# Patient Record
Sex: Female | Born: 1940
Health system: Southern US, Community
[De-identification: ages and names within clinical notes are randomized; demographics above are authoritative.]

## PROBLEM LIST (undated history)

## (undated) DIAGNOSIS — D649 Anemia, unspecified: Secondary | ICD-10-CM

## (undated) DIAGNOSIS — I10 Essential (primary) hypertension: Secondary | ICD-10-CM

## (undated) DIAGNOSIS — M1990 Unspecified osteoarthritis, unspecified site: Secondary | ICD-10-CM

## (undated) DIAGNOSIS — F0390 Unspecified dementia without behavioral disturbance: Secondary | ICD-10-CM

## (undated) DIAGNOSIS — F32A Depression, unspecified: Secondary | ICD-10-CM

## (undated) DIAGNOSIS — E785 Hyperlipidemia, unspecified: Secondary | ICD-10-CM

## (undated) DIAGNOSIS — F329 Major depressive disorder, single episode, unspecified: Secondary | ICD-10-CM

## (undated) HISTORY — PX: ABDOMINAL HYSTERECTOMY: SHX81

## (undated) HISTORY — DX: Unspecified dementia, unspecified severity, without behavioral disturbance, psychotic disturbance, mood disturbance, and anxiety: F03.90

## (undated) HISTORY — PX: KNEE SURGERY: SHX244

## (undated) HISTORY — DX: Anemia, unspecified: D64.9

## (undated) HISTORY — DX: Hyperlipidemia, unspecified: E78.5

## (undated) HISTORY — DX: Essential (primary) hypertension: I10

## (undated) HISTORY — PX: OTHER SURGICAL HISTORY: SHX169

## (undated) HISTORY — PX: COLONOSCOPY: SHX174

---

## 2001-03-13 ENCOUNTER — Ambulatory Visit (HOSPITAL_COMMUNITY): Admission: RE | Admit: 2001-03-13 | Discharge: 2001-03-13 | Payer: Self-pay | Admitting: Pulmonary Disease

## 2002-03-18 ENCOUNTER — Ambulatory Visit (HOSPITAL_COMMUNITY): Admission: RE | Admit: 2002-03-18 | Discharge: 2002-03-18 | Payer: Self-pay | Admitting: Pulmonary Disease

## 2004-01-10 ENCOUNTER — Emergency Department (HOSPITAL_COMMUNITY): Admission: EM | Admit: 2004-01-10 | Discharge: 2004-01-10 | Payer: Self-pay | Admitting: Emergency Medicine

## 2004-01-11 ENCOUNTER — Ambulatory Visit (HOSPITAL_COMMUNITY): Admission: RE | Admit: 2004-01-11 | Discharge: 2004-01-11 | Payer: Self-pay | Admitting: Orthopaedic Surgery

## 2004-01-22 ENCOUNTER — Inpatient Hospital Stay (HOSPITAL_COMMUNITY): Admission: RE | Admit: 2004-01-22 | Discharge: 2004-01-25 | Payer: Self-pay | Admitting: Orthopedic Surgery

## 2004-04-18 ENCOUNTER — Encounter (HOSPITAL_COMMUNITY): Admission: RE | Admit: 2004-04-18 | Discharge: 2004-05-18 | Payer: Self-pay | Admitting: Orthopedic Surgery

## 2004-05-12 ENCOUNTER — Ambulatory Visit (HOSPITAL_COMMUNITY): Admission: RE | Admit: 2004-05-12 | Discharge: 2004-05-12 | Payer: Self-pay | Admitting: Pulmonary Disease

## 2004-05-18 ENCOUNTER — Encounter (HOSPITAL_COMMUNITY): Admission: RE | Admit: 2004-05-18 | Discharge: 2004-06-17 | Payer: Self-pay | Admitting: Orthopedic Surgery

## 2004-10-03 ENCOUNTER — Ambulatory Visit: Payer: Self-pay | Admitting: Orthopedic Surgery

## 2005-08-22 ENCOUNTER — Ambulatory Visit (HOSPITAL_COMMUNITY): Admission: RE | Admit: 2005-08-22 | Discharge: 2005-08-22 | Payer: Self-pay | Admitting: Pulmonary Disease

## 2006-08-30 ENCOUNTER — Ambulatory Visit (HOSPITAL_COMMUNITY): Admission: RE | Admit: 2006-08-30 | Discharge: 2006-08-30 | Payer: Self-pay | Admitting: Pulmonary Disease

## 2007-01-31 ENCOUNTER — Ambulatory Visit (HOSPITAL_COMMUNITY): Admission: RE | Admit: 2007-01-31 | Discharge: 2007-01-31 | Payer: Self-pay | Admitting: Pulmonary Disease

## 2008-02-28 ENCOUNTER — Ambulatory Visit (HOSPITAL_COMMUNITY): Admission: RE | Admit: 2008-02-28 | Discharge: 2008-02-28 | Payer: Self-pay | Admitting: Pulmonary Disease

## 2009-03-02 ENCOUNTER — Ambulatory Visit (HOSPITAL_COMMUNITY): Admission: RE | Admit: 2009-03-02 | Discharge: 2009-03-02 | Payer: Self-pay | Admitting: Pulmonary Disease

## 2010-03-15 ENCOUNTER — Ambulatory Visit (HOSPITAL_COMMUNITY): Admission: RE | Admit: 2010-03-15 | Discharge: 2010-03-15 | Payer: Self-pay | Admitting: Pulmonary Disease

## 2010-11-13 ENCOUNTER — Encounter: Payer: Self-pay | Admitting: Pulmonary Disease

## 2011-03-10 NOTE — Discharge Summary (Signed)
Doris Riley, Doris Riley                         ACCOUNT NO.:  [id number]   MEDICAL RECORD NO.:  [id number]                   PATIENT TYPE:  INP   LOCATION:  A330                                 FACILITY:  [facility]   PHYSICIAN:  Vickki Hearing, M.D.           DATE OF BIRTH:  [birth year]   DATE OF ADMISSION:  01/22/2004  DATE OF DISCHARGE:  01/25/2004                                 DISCHARGE SUMMARY   ADMISSION DIAGNOSIS:  Tibial plateau fracture, left knee.   DISCHARGE DIAGNOSIS:  Tibial plateau fracture, left knee.   PROCEDURE:  Open treatment and internal fixation with artificial bone graft,  left knee.   IMPLANTS USED:  DBX putty, 1 cc.  Milled cancellous bone chips, 15 cc.  We  used a periarticular plate with five screws.   HOSPITAL COURSE:  The patient had a split left tibial plateau fracture and  underwent open treatment and internal fixation with bone graft, as stated.  She tolerated that well.  She was placed in a CPM postoperatively and  allowed to be nonweightbearing with crutches and physical therapy.  She  tolerated that well and was discharged home with Vicodin, nonweightbearing  status, physical therapy at home, and was scheduled for follow up in one  week.   DISPOSITION:  Home.   CONDITION ON DISCHARGE:  Improved.   STATUS:  Nonweightbearing with a walker.     ___________________________________________                                         Vickki Hearing, M.D.   SEH/MEDQ  D:  02/07/2004  T:  02/08/2004  Job:  [id number]
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
## 2011-03-13 ENCOUNTER — Other Ambulatory Visit (HOSPITAL_COMMUNITY): Payer: Self-pay | Admitting: Pulmonary Disease

## 2011-03-13 DIAGNOSIS — Z139 Encounter for screening, unspecified: Secondary | ICD-10-CM

## 2011-03-23 ENCOUNTER — Ambulatory Visit (HOSPITAL_COMMUNITY)
Admission: RE | Admit: 2011-03-23 | Discharge: 2011-03-23 | Disposition: A | Discharge status: Home / Self Care | Payer: Medicare Other | Source: Ambulatory Visit | Attending: Pulmonary Disease | Admitting: Pulmonary Disease

## 2011-03-23 DIAGNOSIS — Z1231 Encounter for screening mammogram for malignant neoplasm of breast: Secondary | ICD-10-CM | POA: Insufficient documentation

## 2011-03-23 DIAGNOSIS — Z139 Encounter for screening, unspecified: Secondary | ICD-10-CM

## 2011-04-22 ENCOUNTER — Emergency Department (HOSPITAL_COMMUNITY): Payer: Medicare Other

## 2011-04-22 ENCOUNTER — Inpatient Hospital Stay (HOSPITAL_COMMUNITY)
Admission: EM | Admit: 2011-04-22 | Discharge: 2011-04-24 | DRG: 683 | Disposition: A | Discharge status: Home / Self Care | Payer: Medicare Other | Attending: Pulmonary Disease | Admitting: Pulmonary Disease

## 2011-04-22 DIAGNOSIS — E860 Dehydration: Secondary | ICD-10-CM | POA: Diagnosis present

## 2011-04-22 DIAGNOSIS — N179 Acute kidney failure, unspecified: Principal | ICD-10-CM | POA: Diagnosis present

## 2011-04-22 DIAGNOSIS — N390 Urinary tract infection, site not specified: Secondary | ICD-10-CM | POA: Diagnosis present

## 2011-04-22 DIAGNOSIS — D649 Anemia, unspecified: Secondary | ICD-10-CM | POA: Diagnosis present

## 2011-04-22 DIAGNOSIS — I10 Essential (primary) hypertension: Secondary | ICD-10-CM | POA: Diagnosis present

## 2011-04-22 DIAGNOSIS — E785 Hyperlipidemia, unspecified: Secondary | ICD-10-CM | POA: Diagnosis present

## 2011-04-22 DIAGNOSIS — A498 Other bacterial infections of unspecified site: Secondary | ICD-10-CM | POA: Diagnosis present

## 2011-04-22 DIAGNOSIS — E876 Hypokalemia: Secondary | ICD-10-CM | POA: Diagnosis present

## 2011-04-22 DIAGNOSIS — Z791 Long term (current) use of non-steroidal anti-inflammatories (NSAID): Secondary | ICD-10-CM

## 2011-04-22 LAB — URINE MICROSCOPIC-ADD ON

## 2011-04-22 LAB — BASIC METABOLIC PANEL
CO2: 27 mEq/L (ref 19–32)
Calcium: 11.1 mg/dL — ABNORMAL HIGH (ref 8.4–10.5)
GFR calc Af Amer: 19 mL/min — ABNORMAL LOW (ref >60)
Glucose, Bld: 111 mg/dL — ABNORMAL HIGH (ref 70–99)
Sodium: 132 mEq/L — ABNORMAL LOW (ref 135–145)

## 2011-04-22 LAB — TROPONIN I: Troponin I: <0.3 ng/mL (ref <0.30)

## 2011-04-22 LAB — DIFFERENTIAL
Basophils Relative: 0 % (ref 0–1)
WBC Morphology: INCREASED

## 2011-04-22 LAB — URINALYSIS, ROUTINE W REFLEX MICROSCOPIC
Nitrite: NEGATIVE
Protein, ur: 100 mg/dL — AB
Specific Gravity, Urine: >1.03 — ABNORMAL HIGH (ref 1.005–1.030)
Urobilinogen, UA: 1 mg/dL (ref 0.0–1.0)
pH: 5 (ref 5.0–8.0)

## 2011-04-22 LAB — CBC
MCHC: 33.8 g/dL (ref 30.0–36.0)
MCV: 84.2 fL (ref 78.0–100.0)
RBC: 3.66 MIL/uL — ABNORMAL LOW (ref 3.87–5.11)

## 2011-04-22 LAB — D-DIMER, QUANTITATIVE: D-Dimer, Quant: 1.93 ug/mL-FEU — ABNORMAL HIGH (ref 0.00–0.48)

## 2011-04-22 LAB — MAGNESIUM: Magnesium: 2.8 mg/dL — ABNORMAL HIGH (ref 1.5–2.5)

## 2011-04-23 ENCOUNTER — Inpatient Hospital Stay (HOSPITAL_COMMUNITY): Payer: Medicare Other

## 2011-04-23 LAB — BASIC METABOLIC PANEL
BUN: 21 mg/dL (ref 6–23)
CO2: 26 mEq/L (ref 19–32)
Chloride: 98 mEq/L (ref 96–112)
GFR calc non Af Amer: 30 mL/min — ABNORMAL LOW (ref >60)

## 2011-04-23 LAB — DIFFERENTIAL
Lymphocytes Relative: 6 % — ABNORMAL LOW (ref 12–46)
Myelocytes: 0 %
Promyelocytes Absolute: 0 %
nRBC: 0 /100 WBC

## 2011-04-23 LAB — CBC
HCT: 31.2 % — ABNORMAL LOW (ref 36.0–46.0)
Hemoglobin: 10.2 g/dL — ABNORMAL LOW (ref 12.0–15.0)
MCH: 28.3 pg (ref 26.0–34.0)
MCV: 86.7 fL (ref 78.0–100.0)
RBC: 3.6 MIL/uL — ABNORMAL LOW (ref 3.87–5.11)
RDW: 13.7 % (ref 11.5–15.5)
WBC: 20.4 10*3/uL — ABNORMAL HIGH (ref 4.0–10.5)

## 2011-04-23 NOTE — H&P (Signed)
  Doris Riley, DESA NO.:  [id number]  MEDICAL RECORD NO.:  [id number]  LOCATION:  A312                          FACILITY:  [facility]  PHYSICIAN:  Cloa Bushong D. Felecia Shelling, MD   DATE OF BIRTH:  [birth year]  DATE OF ADMISSION:  04/22/2011 DATE OF DISCHARGE:  LH                             HISTORY & PHYSICAL   CHIEF COMPLAINT:  Generalized weakness and shortness of breath.  HISTORY OF PRESENT ILLNESS:  This is a 70 year old female patient with history of hypertension and hyperlipidemia who is a patient of Dr. Kari Baars came to emergency room with above complaints.  The patient recently had these symptoms especially during the daytime.  She continued to monitor her symptoms at home.  However, today she tried to wash her car and she became severely short of breath.  The patient was brought to emergency room and she was evaluated and was found to have second significant renal failure with hypokalemia.  The patient was then started on IV fluids and potassium supplements.  She was admitted for further evaluation.  REVIEW OF SYSTEMS:  The patient feels generally hot and sweaty.  She has no fever, chills, chest pain, nausea, vomiting, abdominal pain, dysuria, urgency, or frequency of urination.  PAST MEDICAL HISTORY: 1. Hypertension. 2. Hyperlipidemia. 3. History of tibial fracture.  CURRENT MEDICATIONS: 1. Benicar/HCTZ 20/12.5, 1 tablet daily. 2. Aleve 1 p.r.n. 3. Multivitamin 1 tablet daily.  SOCIAL HISTORY:  The patient lives alone at home.  She has no history of alcohol, tobacco, or substance abuse.  FAMILY HISTORY:  Her mother died of CA of the colon as well as her brother.  Her father died of CA of the prostate.  PHYSICAL EXAMINATION:  GENERAL:  The patient is alert, awake, and sick looking. VITAL SIGNS:  Blood pressure 152/70, pulse 80, respiratory rate 18, temperature 98.4 degrees Fahrenheit. CHEST:  Decreased air entry, few rhonchi. CARDIOVASCULAR  SYSTEM:  First and second heart sounds heard.  No murmur, no gallop. ABDOMEN:  Soft and lax.  Bowel sounds positive.  No mass or organomegaly. EXTREMITIES:  No leg edema.  LABS ON ADMISSION:  BMP:  Sodium 132, potassium 2.8, chloride 91, carbon dioxide 27, glucose 111, BUN 28, creatinine 3.1, calcium 11, troponin is less than 0.3.  CBC:  WBC 24.6, hemoglobin 10.4, hematocrit 30.8, and platelets 348,000, magnesium 2.8.  Urinalysis showed specific gravity greater than 1.030, pH 5.0, leukocytes trace and WBCs 21-50.  ASSESSMENT: 1. Renal failure probably acute, etiology not clear.  However, the     fact the patient has been taking NSAID could be the causing factor. 2. Severe hypokalemia. 3. Urinary tract infection. 4. History of hypertension. 5. History of hyperlipidemia.  PLAN:  We will continue the patient on IV fluids and potassium supplement.  We will start the patient on treatment for urinary tract infection.  We will do nephrology consult and we will continue the patient on her regular medications.     Khalin Royce D. Felecia Shelling, MD  TDF/MEDQ  D:  04/22/2011  T:  04/22/2011  Job:  [id number]  Electronically Signed by Avon Gully MD on 04/23/2011 09:03:41 AM

## 2011-04-24 LAB — DIFFERENTIAL
Basophils Absolute: 0.1 10*3/uL (ref 0.0–0.1)
Basophils Relative: 1 % (ref 0–1)
Eosinophils Absolute: 0.3 10*3/uL (ref 0.0–0.7)
Neutro Abs: 12.3 10*3/uL — ABNORMAL HIGH (ref 1.7–7.7)
Neutrophils Relative %: 74 % (ref 43–77)

## 2011-04-24 LAB — PHOSPHORUS: Phosphorus: 1.9 mg/dL — ABNORMAL LOW (ref 2.3–4.6)

## 2011-04-24 LAB — CBC
Hemoglobin: 9.3 g/dL — ABNORMAL LOW (ref 12.0–15.0)
MCH: 28.5 pg (ref 26.0–34.0)
Platelets: 413 10*3/uL — ABNORMAL HIGH (ref 150–400)
RBC: 3.26 MIL/uL — ABNORMAL LOW (ref 3.87–5.11)
WBC: 16.6 10*3/uL — ABNORMAL HIGH (ref 4.0–10.5)

## 2011-04-24 LAB — URINE CULTURE: Culture  Setup Time: 201206302009

## 2011-04-24 LAB — BASIC METABOLIC PANEL
Calcium: 10.2 mg/dL (ref 8.4–10.5)
GFR calc Af Amer: 46 mL/min — ABNORMAL LOW (ref >60)
GFR calc non Af Amer: 38 mL/min — ABNORMAL LOW (ref >60)
Glucose, Bld: 113 mg/dL — ABNORMAL HIGH (ref 70–99)
Potassium: 3.5 mEq/L (ref 3.5–5.1)
Sodium: 135 mEq/L (ref 135–145)

## 2011-04-24 LAB — IRON AND TIBC
Iron: 16 ug/dL — ABNORMAL LOW (ref 42–135)
Iron: 18 ug/dL — ABNORMAL LOW (ref 42–135)
TIBC: 174 ug/dL — ABNORMAL LOW (ref 250–470)
UIBC: 190 ug/dL

## 2011-04-24 LAB — VITAMIN B12: Vitamin B-12: >2000 pg/mL — ABNORMAL HIGH (ref 211–911)

## 2011-04-24 LAB — FERRITIN: Ferritin: 1087 ng/mL — ABNORMAL HIGH (ref 10–291)

## 2011-04-25 LAB — PTH, INTACT AND CALCIUM
Calcium, Total (PTH): 9.1 mg/dL (ref 8.4–10.5)
PTH: 17.3 pg/mL (ref 14.0–72.0)

## 2011-04-26 NOTE — Group Therapy Note (Signed)
  NAMEKENORA, SPAYD NO.:  [id number]  MEDICAL RECORD NO.:  [id number]  LOCATION:  A312                          FACILITY:  [facility]  PHYSICIAN:  Edward L. Juanetta Gosling, M.D.DATE OF BIRTH:  1941-01-28  DATE OF PROCEDURE: DATE OF DISCHARGE:                                PROGRESS NOTE   Ms. Vecchiarelli is better.  She has no new complaints.  Her renal ultrasound was essentially normal.  She had a temperature of 100.3, but I think she has got a urinary tract infection with E-coli.  Her temperature is 98.9 now, pulse 77, respirations 19, blood pressure 146/69 and O2 sats 98%. Her renal function has markedly improved and her creatinine is now down to 1.39.  Her CBC shows her hemoglobin is down to 9.3.  I think most of that is delusional.  Assessment then is that she has had an episode of acute renal failure probably multifactorial.  She is anemic.  She has been taking a large amount of nonsteroidal anti-inflammatory drugs, so I think her anemia maybe related to that, but she is so much better.  I am going to discharge her home and get the anemia workup as an outpatient.  I will have her get an anemia profile here today.     Edward L. Juanetta Gosling, M.D.     ELH/MEDQ  D:  04/24/2011  T:  04/24/2011  Job:  [id number]  Electronically Signed by Kari Baars M.D. on 04/26/2011 08:59:23 AM

## 2011-04-26 NOTE — Discharge Summary (Signed)
  NAMEJIA, Doris NO.:  [id number]  MEDICAL RECORD NO.:  [id number]  LOCATION:  A312                          FACILITY:  [facility]  PHYSICIAN:  Hrishikesh Hoeg L. Juanetta Gosling, M.D.DATE OF BIRTH:  1941-07-23  DATE OF ADMISSION:  04/22/2011 DATE OF DISCHARGE:  07/02/2012LH                              DISCHARGE SUMMARY   FINAL DISCHARGE DIAGNOSES: 1. Acute renal failure, multifactorial. 2. Dehydration. 3. Hypokalemia. 4. Hypertension. 5. Hyperlipidemia. 6. History of tibial fracture. 7. Anemia.  [name] is a 70 year old who has a history of hypertension and hyperlipidemia who came to the emergency room because of weakness and shortness of breath.  She had been having symptoms for the last several days.  She tried to wash her car on the day of admission, became acutely short of breath, was found to have significant renal failure and hypokalemia.  Exam showed that she appeared to be acutely sick.  Blood pressure 152/70, pulse 80 and respirations 18.  The rest of her examination was pretty much unremarkable.  Her laboratory work showed sodium was 132, potassium was 2.8, BUN was 28, creatinine 3.1, white count was 24,600, hemoglobin of 10.4, platelets 348.  Urine showed white cells and grew E-coli.  HOSPITAL COURSE:  She was treated with IV fluids and showed marked improvement in her renal function.  Her potassium came back to normal. Her hemoglobin dropped from the initial hemoglobin of 10.4-9.3.  Most of this was felt to be delusional.  Her white count was down to 16,600 by the time of discharge and her creatinine was 1.39.  She was noted to have been taking a great deal of Aleve at home.  She was also on Benicar HCT and was felt that she had multifactorial renal failure.  Her renal ultrasound did not show medical kidney disease and did not show obstruction.  She is going to be discharged home in markedly improved condition.  She has pending a anemia profile and she  will return to my office on Thursday, the 5th for repeat CBC and repeat BMET.  She will stop her current Benicar HCT and start on Benicar 40 mg daily.  She can take Tylenol 500 mg q.6 h. p.r.n. pain.  She will stop Aleve that she has been taking at home.  She will take fish oil 1000 mg daily, multiple vitamins daily, Cipro 500 mg b.i.d. x5 more days.  Pending results of her urine culture.  She will be on Zocor 40 mg daily and Zetia 10 mg daily and she will be on iron 325 mg daily also.  As mentioned, she will follow up in my office in about 4 days for lab work and been seen in about 2 weeks.     Sneijder Bernards L. Juanetta Gosling, M.D.     ELH/MEDQ  D:  04/24/2011  T:  04/24/2011  Job:  [id number]  Electronically Signed by Kari Baars M.D. on 04/26/2011 08:59:18 AM

## 2011-04-26 NOTE — Group Therapy Note (Signed)
  NAMEROSALEAH, PERSON NO.:  [id number]  MEDICAL RECORD NO.:  [id number]  LOCATION:                                 FACILITY:  PHYSICIAN:  Charnese Federici L. Juanetta Gosling, M.D.DATE OF BIRTH:  03/13/1941  DATE OF PROCEDURE: DATE OF DISCHARGE:                                PROGRESS NOTE   Ms. Baum was admitted yesterday with hypokalemia, elevated creatinine, and shortness of breath.  She says she is doing okay now, has no new complaints.  PHYSICAL EXAMINATION:  GENERAL:  Her exam shows she is awake and alert. VITAL SIGNS:  Temperature is 98.5, pulse 77, respirations 19, blood pressure 114/66, O2 sats 95% on room air.  Weight is 90.4 kg.  She had a BNP today that was 512, but did not have a BMET ordered yet, so we are going to order that.  Her potassium was 2.8, BUN was 28, creatinine of 3.04.  Assessment then is that she has acute renal failure.  She is hypokalemic.  She was on a ARB with a diuretic, so that may be part of the problem, but she was also taking Aleve on a daily basis.  I think she was probably also somewhat dehydrated.  Plan then is for fluid replenishment.  She is going to have a renal ultrasound, Dr. Kristian Covey, has seen her, and I appreciate his help. Repeat her labs today and see how much if any she is improved.  I discussed this at length with the patient and her family this morning.     Myrian Botello L. Juanetta Gosling, M.D.     ELH/MEDQ  D:  04/23/2011  T:  04/23/2011  Job:  [id number]  Electronically Signed by Kari Baars M.D. on 04/26/2011 08:59:27 AM

## 2011-04-28 LAB — CULTURE, BLOOD (SINGLE): Culture: NO GROWTH

## 2011-04-30 LAB — ALDOSTERONE + RENIN ACTIVITY W/ RATIO
ALDO / PRA Ratio: 0.4 Ratio — ABNORMAL LOW (ref 0.9–28.9)
Aldosterone: <1 ng/dL

## 2011-05-12 NOTE — Consult Note (Addendum)
Doris Riley, Doris Riley               ACCOUNT NO.:  [id number]  MEDICAL RECORD NO.:  [id number]  LOCATION:                                 FACILITY:  PHYSICIAN:  Tesfaye D. Felecia Shelling, MD   DATE OF BIRTH:  [birth year]  DATE OF CONSULTATION: DATE OF DISCHARGE:                                CONSULTATION   REQUESTING PHYSICIAN:  Tesfaye D. Felecia Shelling, MD  REASON FOR CONSULT:  Elevated BUN and creatinine.  [name] is a 70 year old female with past medical history of hypertension and also hyperlipidemia.  Presently came to the emergency room because of significant weakness.  According to the patient, for the last couple of days, she was not able to eat.  Also, she says that she was feeling weak to the point where she could not stand.  When she was brought to the emergency room, she was also found to have hypokalemia. Ms. Neu denies taking any diuretics ever.  The patient has been taking Aleve 1-3 tablets a day for arthritis for the last 2-3 years. Presently, she is feeling better.  She does not have any nausea, no vomiting.  She denies also any fevers, chills, or sweating.  PAST MEDICAL HISTORY:  She has a history of hypertension.  She has history of hyperlipidemia and history of degenerative joint disease.  SURGICAL HISTORY:  She has a history of tibial fracture status post surgery.  Her medications as an outpatient include: 1. Benicar/hydrochlorothiazide 20/12.5 mg p.o. daily. 2. Aleve 1 tablet p.r.n., but according to her she has been taking up     to 3 times a day depending on her joint on a daily basis. 3. Multivitamin 1 tablet p.r.n.  SOCIAL HISTORY:  No history of smoking.  No history of alcohol abuse and no history of illicit drug use.  FAMILY HISTORY:  No history of renal failure.  REVIEW OF SYSTEMS:  Presently, she is feeling better.  Her main complaint seems to be weakness and this is also associated with poor appetite.  She denies any nausea or vomiting.  She does not  have any diarrhea.  She has occasional urgency, but no frequency.  She denies also any swelling of the legs.  PHYSICAL EXAMINATION:  GENERAL:  The patient is alert, in no apparent distress. VITAL SIGNS:  Her temperature is 98.5, pulse of 77, blood pressure 114/66, and O2 saturation 95.  Her white blood cell count is 24.6, hemoglobin 10.4, and hematocrit 30.8.  Her sodium is 132, potassium 2.8, chloride 91, CO2 of 27, BUN is 28, and creatinine is 3.0.  She has a calcium of 11.1, magnesium of 2.8.  ASSESSMENT: 1. Acute kidney injury.  At this moment, it seems to be multifactorial     including nonsteroidal-induced renal failure plus dehydration and     possibly plus also prerenal syndrome as the patient has urine     specific gravity of 1.03. 2. History of hypokalemia.  At this moment since she is on     Benicar/hydrochlorothiazide, probably that may have caused her     hypokalemia.  At this moment with history of hypertension,     hypokalemia, other etiologies such as hyperaldosteronism  and also     ill syndrome, could be put into the differential diagnosis. 3. History of urinary tract infection. 4. History of anemia.  At this moment, it seems to be mild. 5. History of hypertension.  She is as an outpatient on Benicar, but     presently the hydrochlorothiazide is discontinued.  Blood pressure     seems to be controlled very well. 6. History of hypercalcemia, possibly related to her dehydration. 7. History of degenerative joint disease. 8. History of hyperlipidemia.  Her medications consist of Cipro now.     She is also on Zetia, Benicar 20 mg p.o. daily, and simvastatin 40     mg p.o. daily.  She is also on IV fluids.  RECOMMENDATIONS:  We will check ultrasound of the kidneys.  We will increase her IV fluids to 175 mL per hour.  We will check also her intact PTH.  We will check renin and aldosterone level.  We will do ultrasound of the kidneys.     [name],  M.D.   ______________________________ [name]. Felecia Shelling, MD    BB/MEDQ  D:  04/23/2011  T:  04/24/2011  Job:  [id number]  Electronically Signed by Avon Gully MD on 05/12/2011 07:08:56 AM Electronically Signed by [name] M.D. on 05/17/2011 04:34:37 PM

## 2011-07-27 ENCOUNTER — Telehealth: Payer: Self-pay

## 2011-07-27 NOTE — Telephone Encounter (Signed)
LMOM to call. Pt will need OV prior to scheduling colonoscopy.

## 2011-07-28 NOTE — Telephone Encounter (Signed)
Pt left VM to call her and leave appt time on machine. I called back and left vm her appt is 08/02/2011 @ 9:00 AM with [name], NP., for OV and she will get set up for colonoscopy at that time if needed.  Told her to please arrive 15 min early and bring ins cards and list of medications. Also, gave the address info.

## 2011-07-31 ENCOUNTER — Telehealth: Payer: Self-pay

## 2011-07-31 NOTE — Telephone Encounter (Signed)
Called and confirmed pt got the Vm with Appt date/time  08/02/2011 @ 9:00 AM with [name], NP.

## 2011-08-02 ENCOUNTER — Ambulatory Visit: Payer: Medicare Other | Admitting: Urgent Care

## 2011-08-03 ENCOUNTER — Ambulatory Visit: Payer: Medicare Other | Admitting: Urgent Care

## 2011-08-07 ENCOUNTER — Ambulatory Visit (INDEPENDENT_AMBULATORY_CARE_PROVIDER_SITE_OTHER): Payer: Medicare Other | Admitting: Gastroenterology

## 2011-08-07 ENCOUNTER — Encounter: Payer: Self-pay | Admitting: Gastroenterology

## 2011-08-07 VITALS — BP 140/80 | HR 63 | Temp 97.9°F | Ht 66.0 in | Wt 196.4 lb

## 2011-08-07 DIAGNOSIS — K5900 Constipation, unspecified: Secondary | ICD-10-CM

## 2011-08-07 DIAGNOSIS — Z800 Family history of malignant neoplasm of digestive organs: Secondary | ICD-10-CM

## 2011-08-07 DIAGNOSIS — D649 Anemia, unspecified: Secondary | ICD-10-CM | POA: Insufficient documentation

## 2011-08-07 DIAGNOSIS — R748 Abnormal levels of other serum enzymes: Secondary | ICD-10-CM

## 2011-08-07 MED ORDER — POLYETHYLENE GLYCOL 3350 GRAN: 17.0000 g | GRANULES | Freq: Every day | Status: DC

## 2011-08-07 NOTE — Assessment & Plan Note (Signed)
Add miralax 17g daily prn.

## 2011-08-07 NOTE — Progress Notes (Signed)
Primary Care Physician:  Fredirick Maudlin, MD  Primary Gastroenterologist:  Jonette Eva, MD   Chief Complaint  Patient presents with  . Colonoscopy    HPI:  Doris Riley is a 70 y.o. female here for consideration of colonoscopy. Earlier this year was in the hospital for acute renal failure, UTI. She was taking a lot of Aleve. She also had some anemia with Hgb in 9 range. Her ferritin was over 1000. B12/folate normal. Anemia panel  most c/w  with anemia of chronic disease +/- mixed anemia.  No rectal bleeding. No melena. Constipation, take green tea which works. No abdominal pain, heartburn, n/v, dysphagia. No weight loss.   Mother had colon cancer in her 65s. Brother had colostomy but patient is not sure why.   Current Outpatient Prescriptions  Medication Sig Dispense Refill  . acetaminophen (TYLENOL) 500 MG tablet Take 500 mg by mouth every 6 (six) hours as needed.        Marland Kitchen amLODipine-olmesartan (AZOR) 5-40 MG per tablet Take 1 tablet by mouth daily.        Marland Kitchen ezetimibe (ZETIA) 10 MG tablet Take 10 mg by mouth daily.        . pravastatin (PRAVACHOL) 40 MG tablet Take 40 mg by mouth daily.       . Polyethylene Glycol 3350 GRAN Take 17 g by mouth daily.  527 g  5    Allergies as of 08/07/2011  . (No Known Allergies)    Past Medical History  Diagnosis Date  . Hyperlipidemia   . Hypertension   . Anemia     mild    Past Surgical History  Procedure Date  . S/p hysterectomy     partial  . Knee surgery     left, fracture  . Colonoscopy     >10 years ago    Family History  Problem Relation Age of Onset  . Colon cancer Mother     41s  . Prostate cancer Father   . GI problems Brother     colostomy, patient not sure why    History   Social History  . Marital Status: Divorced    Spouse Name: N/A    Number of Children: 1  . Years of Education: N/A   Occupational History  . retired    Social History Main Topics  . Smoking status: Never Smoker   . Smokeless  tobacco: Not on file  . Alcohol Use: Yes     half a glass a week  . Drug Use: No  . Sexually Active: Not on file   Other Topics Concern  . Not on file   Social History Narrative  . No narrative on file      ROS:  General: Negative for anorexia, weight loss, fever, chills, fatigue, weakness. Eyes: Negative for vision changes.  ENT: Negative for hoarseness, difficulty swallowing , nasal congestion. CV: Negative for chest pain, angina, palpitations, dyspnea on exertion, peripheral edema.  Respiratory: Negative for dyspnea at rest, dyspnea on exertion, cough, sputum, wheezing.  GI: See history of present illness. GU:  Negative for dysuria, hematuria, urinary incontinence, urinary frequency, nocturnal urination.  MS: Negative for joint pain, low back pain.  Derm: Negative for rash or itching.  Neuro: Negative for weakness, abnormal sensation, seizure, frequent headaches, memory loss, confusion.  Psych: Negative for anxiety, depression, suicidal ideation, hallucinations.  Endo: Negative for unusual weight change.  Heme: Negative for bruising or bleeding. Allergy: Negative for rash or hives.  Physical Examination:  BP 140/80  Pulse 63  Temp(Src) 97.9 F (36.6 C) (Temporal)  Ht 5\' 6"  (1.676 m)  Wt 196 lb 6.4 oz (89.086 kg)  BMI 31.70 kg/m2   General: Well-nourished, well-developed in no acute distress.  Head: Normocephalic, atraumatic.   Eyes: Conjunctiva pink, no icterus. Mouth: Oropharyngeal mucosa moist and pink , no lesions erythema or exudate. Neck: Supple without thyromegaly, masses, or lymphadenopathy.  Lungs: Clear to auscultation bilaterally.  Heart: Regular rate and rhythm, no murmurs rubs or gallops.  Abdomen: Bowel sounds are normal, nontender, nondistended, no hepatosplenomegaly or masses, no abdominal bruits or    hernia , no rebound or guarding.   Rectal: deferef to time for colonoscopy.. Extremities: No lower extremity edema. No clubbing or deformities.    Neuro: Alert and oriented x 4 , grossly normal neurologically.  Skin: Warm and dry, no rash or jaundice.   Psych: Alert and cooperative, normal mood and affect.  Labs: 05/26/2011: WBC 6300, H/H 11.5/37.2, Plt 356,000, bun 10, cre 0.81, tbili 0.4, ap 124H, ast 22, alt 13, alb 4.3  Lab Results  Component Value Date   IRON 18* 04/24/2011   TIBC 208* 04/24/2011   FERRITIN 1087* 04/24/2011   Lab Results  Component Value Date   VITAMINB12 >2000* 04/24/2011   Lab Results  Component Value Date   FOLATE >20.0 04/24/2011      Imaging Studies: No results found.

## 2011-08-07 NOTE — Assessment & Plan Note (Signed)
Normoctyic anemia much improved since hospitalization. Anemia panel most c/w anemia chronic disease/mixed anemia. Heme status unknown. FH CRC. Constipation well-controlled with dietary measures. Recommend colonoscopy.  I have discussed the risks, alternatives, benefits with regards to but not limited to the risk of reaction to medication, bleeding, infection, perforation and the patient is agreeable to proceed. Written consent to be obtained.

## 2011-08-07 NOTE — Patient Instructions (Signed)
We have scheduled you for a colonoscopy. Please see separate instructions. We will review your most recent labs and let you know if you need any further labs done. Begin Miralax 17g daily for constipation.

## 2011-08-07 NOTE — Assessment & Plan Note (Signed)
Mildly elevated alk phos. Obtain most recent labs done from PCP. Further recommendations to follow.

## 2011-08-09 NOTE — Progress Notes (Signed)
Cc to PCP 

## 2011-08-28 ENCOUNTER — Encounter (HOSPITAL_COMMUNITY): Payer: Self-pay | Admitting: Pharmacy Technician

## 2011-08-31 MED ORDER — SODIUM CHLORIDE 0.45 % IV SOLN
Freq: Once | INTRAVENOUS | Status: AC
  Administered 2011-09-01: 10:00:00 via INTRAVENOUS

## 2011-09-01 ENCOUNTER — Encounter (HOSPITAL_COMMUNITY)
Admission: RE | Disposition: A | Discharge status: Home / Self Care | Payer: Self-pay | Source: Ambulatory Visit | Attending: Gastroenterology

## 2011-09-01 ENCOUNTER — Encounter (HOSPITAL_COMMUNITY): Payer: Self-pay | Admitting: *Deleted

## 2011-09-01 ENCOUNTER — Ambulatory Visit (HOSPITAL_COMMUNITY)
Admission: RE | Admit: 2011-09-01 | Discharge: 2011-09-01 | Disposition: A | Discharge status: Home / Self Care | Payer: Medicare Other | Source: Ambulatory Visit | Attending: Gastroenterology | Admitting: Gastroenterology

## 2011-09-01 ENCOUNTER — Telehealth: Payer: Self-pay | Admitting: General Practice

## 2011-09-01 DIAGNOSIS — D649 Anemia, unspecified: Secondary | ICD-10-CM

## 2011-09-01 DIAGNOSIS — K5730 Diverticulosis of large intestine without perforation or abscess without bleeding: Secondary | ICD-10-CM | POA: Insufficient documentation

## 2011-09-01 DIAGNOSIS — E785 Hyperlipidemia, unspecified: Secondary | ICD-10-CM | POA: Insufficient documentation

## 2011-09-01 DIAGNOSIS — Z79899 Other long term (current) drug therapy: Secondary | ICD-10-CM | POA: Insufficient documentation

## 2011-09-01 DIAGNOSIS — I10 Essential (primary) hypertension: Secondary | ICD-10-CM | POA: Insufficient documentation

## 2011-09-01 DIAGNOSIS — K644 Residual hemorrhoidal skin tags: Secondary | ICD-10-CM | POA: Insufficient documentation

## 2011-09-01 DIAGNOSIS — Z1211 Encounter for screening for malignant neoplasm of colon: Secondary | ICD-10-CM | POA: Insufficient documentation

## 2011-09-01 DIAGNOSIS — Z800 Family history of malignant neoplasm of digestive organs: Secondary | ICD-10-CM | POA: Insufficient documentation

## 2011-09-01 HISTORY — PX: COLONOSCOPY: SHX5424

## 2011-09-01 SURGERY — COLONOSCOPY
Anesthesia: Moderate Sedation

## 2011-09-01 MED ORDER — LUBIPROSTONE 24 MCG PO CAPS: 24.0000 ug | ORAL_CAPSULE | Freq: Two times a day (BID) | ORAL | Status: AC

## 2011-09-01 MED ORDER — MEPERIDINE HCL 100 MG/ML IJ SOLN
INTRAMUSCULAR | Status: AC
  Filled 2011-09-01: qty 2

## 2011-09-01 MED ORDER — MEPERIDINE HCL 100 MG/ML IJ SOLN
INTRAMUSCULAR | Status: DC | PRN
  Administered 2011-09-01 (×2): 25 mg via INTRAVENOUS

## 2011-09-01 MED ORDER — MIDAZOLAM HCL 5 MG/5ML IJ SOLN
INTRAMUSCULAR | Status: AC
  Filled 2011-09-01: qty 10

## 2011-09-01 MED ORDER — MIDAZOLAM HCL 5 MG/5ML IJ SOLN
INTRAMUSCULAR | Status: DC | PRN
  Administered 2011-09-01: 1 mg via INTRAVENOUS
  Administered 2011-09-01: 2 mg via INTRAVENOUS
  Administered 2011-09-01: 1 mg via INTRAVENOUS

## 2011-09-01 NOTE — H&P (Signed)
Reason for Visit     Colonoscopy        Vitals - Last Recorded       BP Pulse Temp(Src) Ht Wt BMI    140/80  63  97.9 F (36.6 C) (Temporal)  5\' 6"  (1.676 m)  196 lb 6.4 oz (89.086 kg)  31.70 kg/m2       Vitals History Recorded       Progress Notes     Tana Coast, PA  08/07/2011  8:28 PM  Signed Primary Care Physician:  Fredirick Maudlin, MD   Primary Gastroenterologist:  Jonette Eva, MD      Chief Complaint   Patient presents with   .  Colonoscopy      HPI:  Doris Riley is a 70 y.o. female here for consideration of colonoscopy. Earlier this year was in the hospital for acute renal failure, UTI. She was taking a lot of Aleve. She also had some anemia with Hgb in 9 range. Her ferritin was over 1000. B12/folate normal. Anemia panel  most c/w  with anemia of chronic disease +/- mixed anemia.  No rectal bleeding. No melena. Constipation, take green tea which works. No abdominal pain, heartburn, n/v, dysphagia. No weight loss.    Mother had colon cancer in her 31s. Brother had colostomy but patient is not sure why.      Current Outpatient Prescriptions   Medication  Sig  Dispense  Refill   .  acetaminophen (TYLENOL) 500 MG tablet  Take 500 mg by mouth every 6 (six) hours as needed.           Marland Kitchen  amLODipine-olmesartan (AZOR) 5-40 MG per tablet  Take 1 tablet by mouth daily.           Marland Kitchen  ezetimibe (ZETIA) 10 MG tablet  Take 10 mg by mouth daily.           .  pravastatin (PRAVACHOL) 40 MG tablet  Take 40 mg by mouth daily.          .  Polyethylene Glycol 3350 GRAN  Take 17 g by mouth daily.   527 g   5       Allergies as of 08/07/2011   .  (No Known Allergies)       Past Medical History   Diagnosis  Date   .  Hyperlipidemia     .  Hypertension     .  Anemia         mild       Past Surgical History   Procedure  Date   .  S/p hysterectomy         partial   .  Knee surgery         left, fracture   .  Colonoscopy         >10 years ago       Family  History   Problem  Relation  Age of Onset   .  Colon cancer  Mother         54s   .  Prostate cancer  Father     .  GI problems  Brother         colostomy, patient not sure why       History       Social History   .  Marital Status:  Divorced       Spouse Name:  N/A       Number  of Children:  1   .  Years of Education:  N/A       Occupational History   .  retired         Social History Main Topics   .  Smoking status:  Never Smoker    .  Smokeless tobacco:  Not on file   .  Alcohol Use:  Yes         half a glass a week   .  Drug Use:  No   .  Sexually Active:  Not on file       Other Topics  Concern   .  Not on file       Social History Narrative   .  No narrative on file        ROS:   General: Negative for anorexia, weight loss, fever, chills, fatigue, weakness. Eyes: Negative for vision changes.   ENT: Negative for hoarseness, difficulty swallowing , nasal congestion. CV: Negative for chest pain, angina, palpitations, dyspnea on exertion, peripheral edema.   Respiratory: Negative for dyspnea at rest, dyspnea on exertion, cough, sputum, wheezing.   GI: See history of present illness. GU:  Negative for dysuria, hematuria, urinary incontinence, urinary frequency, nocturnal urination.   MS: Negative for joint pain, low back pain.   Derm: Negative for rash or itching.   Neuro: Negative for weakness, abnormal sensation, seizure, frequent headaches, memory loss, confusion.   Psych: Negative for anxiety, depression, suicidal ideation, hallucinations.   Endo: Negative for unusual weight change.   Heme: Negative for bruising or bleeding. Allergy: Negative for rash or hives.     Physical Examination:   BP 140/80  Pulse 63  Temp(Src) 97.9 F (36.6 C) (Temporal)  Ht 5\' 6"  (1.676 m)  Wt 196 lb 6.4 oz (89.086 kg)  BMI 31.70 kg/m2    General: Well-nourished, well-developed in no acute distress.   Head: Normocephalic, atraumatic.    Eyes: Conjunctiva pink,  no icterus. Mouth: Oropharyngeal mucosa moist and pink , no lesions erythema or exudate. Neck: Supple without thyromegaly, masses, or lymphadenopathy.   Lungs: Clear to auscultation bilaterally.   Heart: Regular rate and rhythm, no murmurs rubs or gallops.   Abdomen: Bowel sounds are normal, nontender, nondistended, no hepatosplenomegaly or masses, no abdominal bruits or    hernia , no rebound or guarding.    Rectal: deferef to time for colonoscopy.. Extremities: No lower extremity edema. No clubbing or deformities.   Neuro: Alert and oriented x 4 , grossly normal neurologically.   Skin: Warm and dry, no rash or jaundice.    Psych: Alert and cooperative, normal mood and affect.   Labs: 05/26/2011: WBC 6300, H/H 11.5/37.2, Plt 356,000, bun 10, cre 0.81, tbili 0.4, ap 124H, ast 22, alt 13, alb 4.3    Lab Results   Component  Value  Date     IRON  18*  04/24/2011     TIBC  208*  04/24/2011     FERRITIN  1087*  04/24/2011    Lab Results   Component  Value  Date     VITAMINB12  >2000*  04/24/2011    Lab Results   Component  Value  Date     FOLATE  >20.0  04/24/2011         Imaging Studies: No results found.         [place] Score  08/09/2011  2:59 PM  Signed Cc to PCP  [place] Score  08/09/2011  2:59 PM  Signed Cc to PCP     Anemia - Tana Coast, PA  08/07/2011  8:23 PM  Signed Normoctyic anemia much improved since hospitalization. Anemia panel most c/w anemia chronic disease/mixed anemia. Heme status unknown. FH CRC. Constipation well-controlled with dietary measures. Recommend colonoscopy.  I have discussed the risks, alternatives, benefits with regards to but not limited to the risk of reaction to medication, bleeding, infection, perforation and the patient is agreeable to proceed. Written consent to be obtained.     Elevated alkaline phosphatase level - Tana Coast, PA  08/07/2011  8:26 PM  Signed Mildly elevated alk phos. Obtain most recent labs done from PCP. Further  recommendations to follow.    Constipation - Tana Coast, PA  08/07/2011  8:27 PM  Signed Add miralax 17g daily prn.

## 2011-09-01 NOTE — Telephone Encounter (Signed)
REVIEWED. AGREE. 

## 2011-09-01 NOTE — Telephone Encounter (Signed)
Placed two boxes of the Amitiza 24 mcg at front for pick up.

## 2011-09-01 NOTE — Telephone Encounter (Signed)
Per Dr. Darrick Penna pt will be coming by for 2 boxes of Amitiza 24 mcg.

## 2011-09-01 NOTE — Interval H&P Note (Signed)
History and Physical Interval Note:   09/01/2011   9:59 AM   Doris Riley  has presented today for surgery, with the diagnosis of anemia constipation  The various methods of treatment have been discussed with the patient and family. After consideration of risks, benefits and other options for treatment, the patient has consented to  Procedure(s): COLONOSCOPY as a surgical intervention .  The patients' history has been reviewed, patient examined, no change in status, stable for surgery.  I have reviewed the patients' chart and labs.  Questions were answered to the patient's satisfaction.     Jonette Eva  MD  THE PATIENT WAS EXAMINED AND THERE IS NO CHANGE IN THE PATIENT'S CONDITION SINCE THE ORIGINAL H&P WAS COMPLETED.

## 2011-09-04 ENCOUNTER — Encounter: Payer: Self-pay | Admitting: Gastroenterology

## 2011-09-08 ENCOUNTER — Encounter (HOSPITAL_COMMUNITY): Payer: Self-pay | Admitting: Gastroenterology

## 2011-09-21 ENCOUNTER — Telehealth: Payer: Self-pay | Admitting: Gastroenterology

## 2011-09-21 NOTE — Progress Notes (Signed)
Recheck cbc JAN 2013. PT MAY NEED EGD.

## 2011-09-21 NOTE — Progress Notes (Signed)
TCS NOV 2012 NO POLYPS TCS 2017 HIGH FIBER DIET

## 2011-09-21 NOTE — Telephone Encounter (Signed)
Pt is aware of OV on 10/26/11 at 1045 with SF

## 2011-09-21 NOTE — Telephone Encounter (Signed)
Called pt and POC. Pt needs TCS in 2017 not 2022. Mom had colon ca at age 68. LVM for pt to call to discuss. OPV in JAN 2013 TO DISCUSS CONSTIPATION. Pt started on AMitiza after TCS.

## 2011-09-25 NOTE — Telephone Encounter (Signed)
Results Cc to PCP  

## 2011-10-26 ENCOUNTER — Encounter: Payer: Self-pay | Admitting: Gastroenterology

## 2011-10-26 ENCOUNTER — Ambulatory Visit (INDEPENDENT_AMBULATORY_CARE_PROVIDER_SITE_OTHER): Payer: Medicare Other | Admitting: Gastroenterology

## 2011-10-26 DIAGNOSIS — R141 Gas pain: Secondary | ICD-10-CM

## 2011-10-26 DIAGNOSIS — K5900 Constipation, unspecified: Secondary | ICD-10-CM

## 2011-10-26 DIAGNOSIS — R140 Abdominal distension (gaseous): Secondary | ICD-10-CM | POA: Insufficient documentation

## 2011-10-26 NOTE — Progress Notes (Signed)
Reminder in epic to follow up in one year °

## 2011-10-26 NOTE — Assessment & Plan Note (Addendum)
IMPROVED WITH GREEN TEA.  CONTINUE GREEN TEA AND COFFEE. FOLLOW A HIGH FIBER DIET. AVOID ITEMS THAT CAUSE BLOATING. FOLLOW UP IN 1 YEAR.

## 2011-10-26 NOTE — Progress Notes (Signed)
  Subjective:    Patient ID: Doris Riley, female    DOB: 1941/01/16, 71 y.o.   MRN: [id number]  PCP: Juanetta Gosling  HPI Taking Green Tea at night & cup of coffee in the AM and has a BM: every AM. Feels bloated in the evening(MILD). Milk does it to her: cereal, & ice cream. Discussed results of TCS.   Past Medical History  Diagnosis Date  . Hyperlipidemia   . Hypertension   . Anemia     mild  . Constipation    Past Surgical History  Procedure Date  . S/p hysterectomy     partial  . Knee surgery     left, fracture  . Colonoscopy     >10 years ago  . Abdominal hysterectomy   . Colonoscopy 09/01/2011    Procedure: COLONOSCOPY;  Surgeon: Arlyce Harman, MD;  Location: [facility];  Service: Endoscopy;  Laterality: N/A;  10:30      Review of Systems     Objective:   Physical Exam  Constitutional: She is oriented to person, place, and time. She appears well-developed and well-nourished.  HENT:  Head: Normocephalic and atraumatic.  Neck: Normal range of motion. Neck supple.  Cardiovascular: Normal rate, regular rhythm and normal heart sounds.   Pulmonary/Chest: Effort normal and breath sounds normal. No respiratory distress.  Abdominal: Soft. Bowel sounds are normal. She exhibits no distension. There is no tenderness.  Neurological: She is alert and oriented to person, place, and time.       NO FOCAL DEFICITS           Assessment & Plan:

## 2011-10-26 NOTE — Patient Instructions (Addendum)
USE 1-2 LACTASE PILLS WITH CEREAL AND ICE CREAM TO PREVENT BLOATING.  ADD A PROBIOTIC DAILY. THEY ARE AVAILABLE OVER THE COUNTER AS ALIGN ($40/MO), PHILLIP'S COLON HEALTH ($30/MO), OR [employer]S BRAND($20/MO). CONTINUE GREEN TEA AND COFFEE. FOLLOW A HIGH FIBER DIET. AVOID ITEMS THAT CAUSE BLOATING. FOLLOW UP IN 1 YEAR.  Constipation in Adults Constipation is having fewer than 2 bowel movements per week. Usually, the stools are hard. As we grow older, constipation is more common. If you try to fix constipation with laxatives, the problem may get worse. This is because laxatives taken over a long period of time make the colon muscles weaker. A low-fiber diet, not taking in enough fluids, and taking some medicines may make these problems worse.  HOME CARE INSTRUCTIONS  Constipation is usually best cared for without medicines. Increasing dietary fiber and eating more fruits and vegetables is the best way to manage constipation.   Slowly increase fiber intake to 25 to 38 grams per day. Whole grains, fruits, vegetables, and legumes are good sources of fiber. A dietitian can further help you incorporate high-fiber foods into your diet.   Drink enough water and fluids to keep your urine clear or pale yellow.   A fiber supplement may be added to your diet if you cannot get enough fiber from foods.   Increasing your activities also helps improve regularity.   Stronger measures, such as magnesium sulfate, should be avoided if possible. This may cause uncontrollable diarrhea. Using magnesium sulfate may not allow you time to make it to the bathroom.   Diverticulosis Diverticulosis is a common condition that develops when small pouches (diverticula) form in the wall of the colon. The risk of diverticulosis increases with age. It happens more often in people who eat a low-fiber diet. Most individuals with diverticulosis have no symptoms. Those individuals with symptoms usually experience belly (abdominal)  pain, constipation, or loose stools (diarrhea).  HOME CARE INSTRUCTIONS  Increase the amount of fiber in your diet as directed by your caregiver or dietician. This may reduce symptoms of diverticulosis.   Drink at least 6 to 8 glasses of water each day to prevent constipation.   Try not to strain when you have a bowel movement.   Avoiding nuts and seeds to prevent complications is still an uncertain benefit.     FOODS HAVING HIGH FIBER CONTENT INCLUDE:  Fruits. Apple, peach, pear, tangerine, raisins, prunes.   Vegetables. Brussels sprouts, asparagus, broccoli, cabbage, carrot, cauliflower, romaine lettuce, spinach, summer squash, tomato, winter squash, zucchini.   Starchy Vegetables. Baked beans, kidney beans, lima beans, split peas, lentils, potatoes (with skin).   Grains. Whole wheat bread, brown rice, bran flake cereal, plain oatmeal, white rice, shredded wheat, bran muffins.    SEEK IMMEDIATE MEDICAL CARE IF:  You develop increasing pain or severe bloating.   You have an oral temperature above 101F.   You develop vomiting or bowel movements that are bloody or black.

## 2011-10-26 NOTE — Assessment & Plan Note (Addendum)
MOST LIKLEY DUE TO LACTOSE INTAKE AND/OR FUNCTIONAL GUT DISORDER  USE 1-2 LACTASE PILLS WITH CEREAL AND ICE CREAM TO PREVENT BLOATING.  ADD A PROBIOTIC DAILY. THEY ARE AVAILABLE OVER THE COUNTER AS ALIGN ($40/MO), [employer]S COLON HEALTH ($30/MO), OR [employer]S BRAND($20/MO).

## 2011-10-30 NOTE — Progress Notes (Signed)
Cc to PCP 

## 2012-03-05 ENCOUNTER — Other Ambulatory Visit (HOSPITAL_COMMUNITY): Payer: Self-pay | Admitting: Pulmonary Disease

## 2012-03-05 DIAGNOSIS — Z139 Encounter for screening, unspecified: Secondary | ICD-10-CM

## 2012-03-25 ENCOUNTER — Ambulatory Visit (HOSPITAL_COMMUNITY)
Admission: RE | Admit: 2012-03-25 | Discharge: 2012-03-25 | Disposition: A | Discharge status: Home / Self Care | Payer: Medicare Other | Source: Ambulatory Visit | Attending: Pulmonary Disease | Admitting: Pulmonary Disease

## 2012-03-25 DIAGNOSIS — Z139 Encounter for screening, unspecified: Secondary | ICD-10-CM

## 2012-03-25 DIAGNOSIS — Z1231 Encounter for screening mammogram for malignant neoplasm of breast: Secondary | ICD-10-CM | POA: Insufficient documentation

## 2012-10-02 ENCOUNTER — Encounter: Payer: Self-pay | Admitting: *Deleted

## 2013-04-30 ENCOUNTER — Other Ambulatory Visit (HOSPITAL_COMMUNITY): Payer: Self-pay | Admitting: Pulmonary Disease

## 2013-04-30 DIAGNOSIS — Z139 Encounter for screening, unspecified: Secondary | ICD-10-CM

## 2013-05-02 ENCOUNTER — Ambulatory Visit (HOSPITAL_COMMUNITY)
Admission: RE | Admit: 2013-05-02 | Discharge: 2013-05-02 | Disposition: A | Discharge status: Home / Self Care | Payer: Medicare Other | Source: Ambulatory Visit | Attending: Pulmonary Disease | Admitting: Pulmonary Disease

## 2013-05-02 DIAGNOSIS — Z139 Encounter for screening, unspecified: Secondary | ICD-10-CM

## 2013-05-02 DIAGNOSIS — Z1231 Encounter for screening mammogram for malignant neoplasm of breast: Secondary | ICD-10-CM | POA: Insufficient documentation

## 2013-07-15 ENCOUNTER — Emergency Department (HOSPITAL_COMMUNITY): Payer: Medicare Other

## 2013-07-15 ENCOUNTER — Encounter (HOSPITAL_COMMUNITY): Payer: Self-pay

## 2013-07-15 ENCOUNTER — Emergency Department (HOSPITAL_COMMUNITY)
Admission: EM | Admit: 2013-07-15 | Discharge: 2013-07-15 | Disposition: A | Discharge status: Home / Self Care | Payer: Medicare Other | Attending: Emergency Medicine | Admitting: Emergency Medicine

## 2013-07-15 DIAGNOSIS — E785 Hyperlipidemia, unspecified: Secondary | ICD-10-CM | POA: Insufficient documentation

## 2013-07-15 DIAGNOSIS — Z79899 Other long term (current) drug therapy: Secondary | ICD-10-CM | POA: Insufficient documentation

## 2013-07-15 DIAGNOSIS — S0181XA Laceration without foreign body of other part of head, initial encounter: Secondary | ICD-10-CM

## 2013-07-15 DIAGNOSIS — D649 Anemia, unspecified: Secondary | ICD-10-CM | POA: Insufficient documentation

## 2013-07-15 DIAGNOSIS — Z23 Encounter for immunization: Secondary | ICD-10-CM | POA: Insufficient documentation

## 2013-07-15 DIAGNOSIS — I10 Essential (primary) hypertension: Secondary | ICD-10-CM | POA: Insufficient documentation

## 2013-07-15 DIAGNOSIS — Y92009 Unspecified place in unspecified non-institutional (private) residence as the place of occurrence of the external cause: Secondary | ICD-10-CM | POA: Insufficient documentation

## 2013-07-15 DIAGNOSIS — Z8719 Personal history of other diseases of the digestive system: Secondary | ICD-10-CM | POA: Insufficient documentation

## 2013-07-15 DIAGNOSIS — S01409A Unspecified open wound of unspecified cheek and temporomandibular area, initial encounter: Secondary | ICD-10-CM | POA: Insufficient documentation

## 2013-07-15 DIAGNOSIS — W010XXA Fall on same level from slipping, tripping and stumbling without subsequent striking against object, initial encounter: Secondary | ICD-10-CM | POA: Insufficient documentation

## 2013-07-15 DIAGNOSIS — Y939 Activity, unspecified: Secondary | ICD-10-CM | POA: Insufficient documentation

## 2013-07-15 MED ORDER — BACITRACIN-NEOMYCIN-POLYMYXIN 400-5-5000 EX OINT
TOPICAL_OINTMENT | Freq: Once | CUTANEOUS | Status: AC
  Administered 2013-07-15: 1 via TOPICAL
  Filled 2013-07-15: qty 1

## 2013-07-15 MED ORDER — LIDOCAINE-EPINEPHRINE (PF) 1 %-1:200000 IJ SOLN
INTRAMUSCULAR | Status: AC
  Filled 2013-07-15: qty 10

## 2013-07-15 MED ORDER — LIDOCAINE-EPINEPHRINE 2 %-1:100000 IJ SOLN: 1.7000 mL | Freq: Once | INTRAMUSCULAR | Status: DC

## 2013-07-15 MED ORDER — TETANUS-DIPHTH-ACELL PERTUSSIS 5-2.5-18.5 LF-MCG/0.5 IM SUSP
0.5000 mL | Freq: Once | INTRAMUSCULAR | Status: AC
  Administered 2013-07-15: 0.5 mL via INTRAMUSCULAR
  Filled 2013-07-15: qty 0.5

## 2013-07-15 NOTE — ED Provider Notes (Signed)
CSN: [id number]     Arrival date & time 07/15/13  0808 History   First MD Initiated Contact with Patient 07/15/13 [phone]     Chief Complaint  Patient presents with  . Fall   (Consider location/radiation/quality/duration/timing/severity/associated sxs/prior Treatment) HPI Comments: Doris Riley is a 72 y.o. Female presenting with a laceration to her left cheek she sustained in a fall this morning.  She tripped over a flower pot on her front porch,  Falling onto concrete and landing on her left face,  Causing laceration to her left cheek.  The injury occurred 4 hours ago.  She washed the injury site and applied antibiotic ointment and a bandage,  But became concerned that she may be better served with stitches.  She denies weakness,  Dizziness, headache, neck pain and had no loc at the time of the event.     The history is provided by the patient.    Past Medical History  Diagnosis Date  . Hyperlipidemia   . Hypertension   . Anemia     mild  . Constipation    Past Surgical History  Procedure Laterality Date  . S/p hysterectomy      partial  . Knee surgery      left, fracture  . Colonoscopy      >10 years ago  . Abdominal hysterectomy    . Colonoscopy  09/01/2011    Procedure: COLONOSCOPY;  Surgeon: Arlyce Harman, MD;  Location: [facility];  Service: Endoscopy;  Laterality: N/A;  10:30   Family History  Problem Relation Age of Onset  . Colon cancer Mother     66s  . Prostate cancer Father   . GI problems Brother     colostomy, patient not sure why  . Colon cancer Brother    History  Substance Use Topics  . Smoking status: Never Smoker   . Smokeless tobacco: Not on file  . Alcohol Use: Yes     Comment: half a glass a week   OB History   Grav Para Term Preterm Abortions TAB SAB Ect Mult Living                 Review of Systems  Constitutional: Negative for fever.  HENT: Negative for congestion, facial swelling, rhinorrhea, dental problem and ear discharge.    Eyes: Negative for visual disturbance.  Respiratory: Negative for shortness of breath.   Cardiovascular: Negative for chest pain.  Gastrointestinal: Negative for abdominal pain.  Musculoskeletal: Positive for arthralgias. Negative for myalgias and joint swelling.  Skin: Positive for wound.  Neurological: Negative for weakness and numbness.    Allergies  Review of patient's allergies indicates no known allergies.  Home Medications   Current Outpatient Rx  Name  Route  Sig  Dispense  Refill  . atorvastatin (LIPITOR) 10 MG tablet   Oral   Take 10 mg by mouth daily.         . Biotin 5000 MCG TABS   Oral   Take 1 tablet by mouth daily.         . Cyanocobalamin (VITAMIN B 12 PO)   Oral   Take 1 tablet by mouth daily.         Chilton Si Tea, Camillia sinensis, (GREEN TEA PO)   Oral   Take 8 oz by mouth every evening.         . IRON PO   Oral   Take 65 mg by mouth daily.         Marland Kitchen  lisinopril (PRINIVIL,ZESTRIL) 40 MG tablet   Oral   Take 40 mg by mouth daily.         . Multiple Vitamin (ONE-DAILY MULTI VITAMINS PO)   Oral   Take 1 tablet by mouth daily.           Marland Kitchen acetaminophen (TYLENOL) 500 MG tablet   Oral   Take 500 mg by mouth every 6 (six) hours as needed. Pain          BP 184/83  Pulse 66  Temp(Src) 98.5 F (36.9 C) (Oral)  Resp 18  Ht 5\' 5"  (1.651 m)  Wt 200 lb (90.719 kg)  BMI 33.28 kg/m2  SpO2 99% Physical Exam  Constitutional: She is oriented to person, place, and time. She appears well-developed and well-nourished.  HENT:  Head: Normocephalic. Head is without raccoon's eyes, without Battle's sign and without contusion.  Neck: Normal range of motion and full passive range of motion without pain. Neck supple. No spinous process tenderness and no muscular tenderness present.  Cardiovascular: Normal rate.   Pulmonary/Chest: Effort normal.  Musculoskeletal: She exhibits tenderness.       Right shoulder: She exhibits tenderness. She  exhibits normal range of motion, no bony tenderness, no swelling, no crepitus, no deformity, no spasm, normal pulse and normal strength.  Mild ttp right upper humerus.  Pt display FROM of right arm at elbow and shoulder without pain.  Neurological: She is alert and oriented to person, place, and time. No sensory deficit.  Skin: Laceration noted.  1 cm laceration left cheek,  Hemostatic,  Subcutaneous.    ED Course  Procedures (including critical care time)  LACERATION REPAIR Performed by: Burgess Amor Authorized by: Burgess Amor Consent: Verbal consent obtained. Risks and benefits: risks, benefits and alternatives were discussed Consent given by: patient Patient identity confirmed: provided demographic data Prepped and Draped in normal sterile fashion Wound explored  Laceration Location: left maxilla  Laceration Length: 1cm  No Foreign Bodies seen or palpated  Anesthesia: local infiltration  Local anesthetic: lidocaine 1% with epinephrine  Anesthetic total: 1 ml  Irrigation method: syringe Amount of cleaning: standard  Skin closure: ethilon 6-0  Number of sutures: 3  Technique: simple interrupted.  Patient tolerance: Patient tolerated the procedure well with no immediate complications.   Tetanus updated.   Labs Review Labs Reviewed - No data to display Imaging Review Ct Head Wo Contrast  07/15/2013   *RADIOLOGY REPORT*  Clinical Data:  Fall, head pain.  Face pain.  CT HEAD WITHOUT CONTRAST CT CERVICAL SPINE WITHOUT CONTRAST  Technique:  Multidetector CT imaging of the head and cervical spine was performed following the standard protocol without intravenous contrast.  Multiplanar CT image reconstructions of the cervical spine were also generated.  Comparison:   None  CT HEAD  Findings: There is no evidence for acute infarction, intracranial hemorrhage, mass lesion, hydrocephalus, or extra-axial fluid. Normal for age cerebral volume.  No significant white matter  disease.  Calvarium intact.  No skull fracture.  No significant scalp hematoma.  IMPRESSION: Negative CT head.  CT CERVICAL SPINE  Findings: No visible cervical spine fracture or traumatic subluxation.  Mild straightening of the normal cervical lordosis. No prevertebral soft tissue swelling or intraspinal hematoma. Ossification of the posterior longitudinal ligament most notable opposite C5, C6, and C7. Disc space narrowing most notable at C3-4, C5-6, and C6-7.  Mild calcific pannus.  No neck masses.  No lung apex lesion.  IMPRESSION: No visible cervical spine fracture.  Degenerative changes as described.   Original Report Authenticated By: Davonna Belling, M.D.   Ct Cervical Spine Wo Contrast  07/15/2013   *RADIOLOGY REPORT*  Clinical Data:  Fall, head pain.  Face pain.  CT HEAD WITHOUT CONTRAST CT CERVICAL SPINE WITHOUT CONTRAST  Technique:  Multidetector CT imaging of the head and cervical spine was performed following the standard protocol without intravenous contrast.  Multiplanar CT image reconstructions of the cervical spine were also generated.  Comparison:   None  CT HEAD  Findings: There is no evidence for acute infarction, intracranial hemorrhage, mass lesion, hydrocephalus, or extra-axial fluid. Normal for age cerebral volume.  No significant white matter disease.  Calvarium intact.  No skull fracture.  No significant scalp hematoma.  IMPRESSION: Negative CT head.  CT CERVICAL SPINE  Findings: No visible cervical spine fracture or traumatic subluxation.  Mild straightening of the normal cervical lordosis. No prevertebral soft tissue swelling or intraspinal hematoma. Ossification of the posterior longitudinal ligament most notable opposite C5, C6, and C7. Disc space narrowing most notable at C3-4, C5-6, and C6-7.  Mild calcific pannus.  No neck masses.  No lung apex lesion.  IMPRESSION: No visible cervical spine fracture.  Degenerative changes as described.   Original Report Authenticated By: Davonna Belling,  M.D.    MDM   1. Laceration of face without complication, initial encounter     .Wound care instructions given.  Pt advised to have sutures removed in 5 days,  Return here sooner for any signs of infection including redness, swelling, worse pain or drainage of pus.       Burgess Amor, PA-C 07/15/13 1016

## 2013-07-15 NOTE — ED Provider Notes (Signed)
Medical screening examination/treatment/procedure(s) were conducted as a shared visit with non-physician practitioner(s) and myself.  I personally evaluated the patient during the encounter and agree with physical exam and plan of care.  Pt is a 72 year old female with history of hypertension, hyperlipidemia who is on anticoagulation who had a mechanical fall this morning. She reports that she tripped over a flower pot and hit her head, left cheek. She did break her glasses with her fall. She denies any numbness, tingling or focal weakness. No chest pain or difficulty breathing. No abdominal pain. Patient has a small superficial laceration to her left cheek. A midline spinal tenderness, step-off or deformity. She is neurologically intact. Will update tetanus, clean wound. Will obtain CT imaging of head and cervical spine given patient's age. Patient verbalizes understanding is comfortable this plan.  Doris Maw Anina Schnake, DO 07/15/13 1622

## 2013-07-15 NOTE — ED Notes (Signed)
Pt in xray

## 2013-07-15 NOTE — ED Notes (Signed)
Pt reports tripped over a flower pot on her porch this am and fell onto concrete.  Pt c/o pain to r shoulder and has small laceration to left cheek.  Denies any LOC.  Denies any neck pain.

## 2014-05-04 ENCOUNTER — Other Ambulatory Visit (HOSPITAL_COMMUNITY): Payer: Self-pay | Admitting: Pulmonary Disease

## 2014-05-04 DIAGNOSIS — Z1231 Encounter for screening mammogram for malignant neoplasm of breast: Secondary | ICD-10-CM

## 2014-05-07 ENCOUNTER — Ambulatory Visit (HOSPITAL_COMMUNITY)
Admission: RE | Admit: 2014-05-07 | Discharge: 2014-05-07 | Disposition: A | Discharge status: Home / Self Care | Payer: Medicare HMO | Source: Ambulatory Visit | Attending: Pulmonary Disease | Admitting: Pulmonary Disease

## 2014-05-07 DIAGNOSIS — Z1231 Encounter for screening mammogram for malignant neoplasm of breast: Secondary | ICD-10-CM

## 2014-11-17 DIAGNOSIS — I10 Essential (primary) hypertension: Secondary | ICD-10-CM | POA: Diagnosis not present

## 2014-11-17 DIAGNOSIS — N19 Unspecified kidney failure: Secondary | ICD-10-CM | POA: Diagnosis not present

## 2014-11-17 DIAGNOSIS — E785 Hyperlipidemia, unspecified: Secondary | ICD-10-CM | POA: Diagnosis not present

## 2014-11-24 DIAGNOSIS — I129 Hypertensive chronic kidney disease with stage 1 through stage 4 chronic kidney disease, or unspecified chronic kidney disease: Secondary | ICD-10-CM | POA: Diagnosis not present

## 2014-11-24 DIAGNOSIS — I10 Essential (primary) hypertension: Secondary | ICD-10-CM | POA: Diagnosis not present

## 2014-11-24 DIAGNOSIS — E785 Hyperlipidemia, unspecified: Secondary | ICD-10-CM | POA: Diagnosis not present

## 2014-11-26 ENCOUNTER — Ambulatory Visit (HOSPITAL_COMMUNITY)
Admission: RE | Admit: 2014-11-26 | Discharge: 2014-11-26 | Disposition: A | Discharge status: Home / Self Care | Payer: Medicare HMO | Source: Ambulatory Visit | Attending: Pulmonary Disease | Admitting: Pulmonary Disease

## 2014-11-26 ENCOUNTER — Other Ambulatory Visit (HOSPITAL_COMMUNITY): Payer: Self-pay | Admitting: Pulmonary Disease

## 2014-11-26 DIAGNOSIS — R2242 Localized swelling, mass and lump, left lower limb: Secondary | ICD-10-CM

## 2014-11-26 DIAGNOSIS — M1712 Unilateral primary osteoarthritis, left knee: Secondary | ICD-10-CM | POA: Diagnosis not present

## 2014-11-26 DIAGNOSIS — M2341 Loose body in knee, right knee: Secondary | ICD-10-CM | POA: Insufficient documentation

## 2014-11-26 DIAGNOSIS — M1711 Unilateral primary osteoarthritis, right knee: Secondary | ICD-10-CM | POA: Diagnosis not present

## 2014-11-26 DIAGNOSIS — M25561 Pain in right knee: Secondary | ICD-10-CM | POA: Diagnosis not present

## 2014-11-26 DIAGNOSIS — M25461 Effusion, right knee: Secondary | ICD-10-CM | POA: Insufficient documentation

## 2014-11-26 DIAGNOSIS — M25562 Pain in left knee: Secondary | ICD-10-CM | POA: Diagnosis not present

## 2014-11-26 DIAGNOSIS — R2241 Localized swelling, mass and lump, right lower limb: Secondary | ICD-10-CM

## 2014-12-14 DIAGNOSIS — M1990 Unspecified osteoarthritis, unspecified site: Secondary | ICD-10-CM | POA: Diagnosis not present

## 2014-12-14 DIAGNOSIS — I10 Essential (primary) hypertension: Secondary | ICD-10-CM | POA: Diagnosis not present

## 2014-12-29 ENCOUNTER — Ambulatory Visit (INDEPENDENT_AMBULATORY_CARE_PROVIDER_SITE_OTHER): Payer: Commercial Managed Care - HMO | Admitting: Orthopedic Surgery

## 2014-12-29 VITALS — HR 76 | Resp 20 | Wt 190.0 lb

## 2014-12-29 DIAGNOSIS — M25562 Pain in left knee: Secondary | ICD-10-CM | POA: Diagnosis not present

## 2014-12-29 DIAGNOSIS — S82142S Displaced bicondylar fracture of left tibia, sequela: Secondary | ICD-10-CM

## 2014-12-29 MED ORDER — ACETAMINOPHEN-CODEINE #3 300-30 MG PO TABS: 1.0000 | ORAL_TABLET | Freq: Four times a day (QID) | ORAL | Status: DC | PRN

## 2014-12-29 NOTE — Patient Instructions (Signed)
[facility] WILL CALL TO CONFIRM DATE IN [place]

## 2014-12-30 ENCOUNTER — Encounter: Payer: Self-pay | Admitting: Orthopedic Surgery

## 2014-12-30 NOTE — Progress Notes (Signed)
Patient ID: Doris Riley, female   DOB: 18-Aug-1941, 74 y.o.   MRN: [id number]  Chief Complaint  Patient presents with  . Knee Pain    status post open treatment internal fixation tibial plateau fracture, increased pain since January 2016     Data Reviewed X-rays were done at the hospital. I interpret the first left knee x-ray as varus arthritis grade 4 with plate fixation of previous tibial plateau fracture with no complications from the plateau fracture which was a lateral fracture  The right knee x-ray show severe arthritis grade 4 as well.  Assessment Osteoarthritis left knee and right knee with left knee more symptomatic. The patient will need bilateral knee replacements. We have discussed the following treatment options. I believe that she should have the hardware removed the wound cultured and then if the wound heals nicely and there is no infection come back for knee replacement. That addresses a left knee  The right knee will need a standard knee replacement as well. She agrees to these plans and will delay surgery until July Plan I will check my schedule and call her regarding a time for surgery in July    Doris Riley is a 74 y.o. female.   HPI 74 year old female fractured her left tibial plateau several years back she did well up until this January where she started having increasing medial knee pain and pain with activity and activities of daily living. She did start with Aleve and went ibuprofen glucosamine with not good relief. She likes to dance and has had trouble doing that. She does not complain of catching locking but complains of a popping sensation in activity related pain both knees left worse than right left knee medial pain Review of Systems Recent fever or chills she rode's positive visual disturbance ankle swelling on the left constipation depression joint pain stiff joints lactose intolerance otherwise normal  Past Medical History  Diagnosis Date  .  Hyperlipidemia   . Hypertension   . Anemia     mild  . Constipation     Past Surgical History  Procedure Laterality Date  . S/p hysterectomy      partial  . Knee surgery      left, fracture  . Colonoscopy      >10 years ago  . Abdominal hysterectomy    . Colonoscopy  09/01/2011    Procedure: COLONOSCOPY;  Surgeon: Arlyce Harman, MD;  [facility];  Service: Endoscopy;  Laterality: N/A;  10:30    Family History  Problem Relation Age of Onset  . Colon cancer Mother     49s  . Prostate cancer Father   . GI problems Brother     colostomy, patient not sure why  . Colon cancer Brother     Social History History  Substance Use Topics  . Smoking status: Never Smoker   . Smokeless tobacco: Not on file  . Alcohol Use: Yes     Comment: half a glass a week    No Known Allergies  Current Outpatient Prescriptions  Medication Sig Dispense Refill  . acetaminophen (TYLENOL) 500 MG tablet Take 500 mg by mouth every 6 (six) hours as needed. Pain    . ezetimibe (ZETIA) 10 MG tablet Take 10 mg by mouth daily.    . Multiple Vitamin (ONE-DAILY MULTI VITAMINS PO) Take 1 tablet by mouth daily.      Marland Kitchen acetaminophen-codeine (TYLENOL #3) 300-30 MG per tablet Take 1 tablet by mouth every  6 (six) hours as needed for moderate pain. 60 tablet 2  . atorvastatin (LIPITOR) 10 MG tablet Take 10 mg by mouth daily.    . Biotin 5000 MCG TABS Take 1 tablet by mouth daily.    . Cyanocobalamin (VITAMIN B 12 PO) Take 1 tablet by mouth daily.    Chilton Si. Green Tea, Camillia sinensis, (GREEN TEA PO) Take 8 oz by mouth every evening.    . IRON PO Take 65 mg by mouth daily.    Marland Kitchen. lisinopril (PRINIVIL,ZESTRIL) 40 MG tablet Take 40 mg by mouth daily.     No current facility-administered medications for this visit.       Physical Exam Pulse 76, resp. rate 20, weight 190 lb (86.183 kg). Physical Exam The patient is well developed well nourished and well groomed. Orientation to person place and time is  normal  Mood is pleasant. Ambulatory status she walks fairly well she has a valgus alignment to the right knee and varus to the left Upper extremities examination of the upper extremities reveal no palpable deformities or malalignments are visual. She has full range of motion normal strength and stability to all the joints. The scans intact motor exam is normal there is good muscle tone normal sensation and pulses which are equal bilaterally  Her right knee flexion is approximately 120 counting for the flexion contracture. It is stable it is in valgus she has excellent muscle tone skin pulse and sensation lymph nodes are negative Left knee flexion is limited to 90 is a 5 flexion contracture the incision is slightly lateral to the patella and has healed nicely. The knee is otherwise stable good muscle tone normal extension power skin is intact pulses are good sensation is normal and lymph nodes are negative

## 2015-02-23 DIAGNOSIS — Z0000 Encounter for general adult medical examination without abnormal findings: Secondary | ICD-10-CM | POA: Diagnosis not present

## 2015-03-29 ENCOUNTER — Encounter: Payer: Self-pay | Admitting: *Deleted

## 2015-03-29 NOTE — Telephone Encounter (Signed)
This encounter was created in error - please disregard.

## 2015-03-31 ENCOUNTER — Encounter: Payer: Self-pay | Admitting: Orthopedic Surgery

## 2015-03-31 ENCOUNTER — Other Ambulatory Visit: Payer: Self-pay | Admitting: *Deleted

## 2015-03-31 ENCOUNTER — Ambulatory Visit (INDEPENDENT_AMBULATORY_CARE_PROVIDER_SITE_OTHER): Payer: Commercial Managed Care - HMO | Admitting: Orthopedic Surgery

## 2015-03-31 VITALS — BP 136/60 | Ht 65.0 in | Wt 191.0 lb

## 2015-03-31 DIAGNOSIS — S82142S Displaced bicondylar fracture of left tibia, sequela: Secondary | ICD-10-CM

## 2015-03-31 DIAGNOSIS — M25562 Pain in left knee: Secondary | ICD-10-CM | POA: Diagnosis not present

## 2015-03-31 DIAGNOSIS — M129 Arthropathy, unspecified: Secondary | ICD-10-CM | POA: Diagnosis not present

## 2015-03-31 DIAGNOSIS — M1712 Unilateral primary osteoarthritis, left knee: Secondary | ICD-10-CM

## 2015-03-31 NOTE — Progress Notes (Signed)
Chief Complaint  Patient presents with  . Follow-up    discuss hardware removal surgery    Preop discussion evaluation and appointment  This patient had a tibial plateau fracture in 2005 we treated her with a Synthes plate and bone graft elevation and internal fixation she did well but developed arthritis primarily actually in the medial compartment and comes in today for discussion about replacement and an hardware removal  I think we can do a one stage procedure as long as there is no infection or bony compromise. I discussed this with her. I wrote it out for her. I showed her pictures and models. She is at [facility] as she only has 85 of knee flexion but she does have full extension. She has no signs of infection on exam  I think we can use a stemmed implant with a revision type system and reconstruct her knee after removal of hardware. I will assess the bone first stability and structure and if we can't do it in one setting she understands that we will require a second procedure  Time spent 18 minutes

## 2015-03-31 NOTE — Patient Instructions (Addendum)
Surgery - 1- hardware removal, 2- knee replacement  July 6

## 2015-04-01 ENCOUNTER — Telehealth: Payer: Self-pay | Admitting: Orthopedic Surgery

## 2015-04-01 NOTE — Telephone Encounter (Signed)
Regarding in-patient/admit surgery planned 04/28/15 at [facility], CPT codes: procedure#1: 20680/20670, and procedure#2: [phone] - contacted insurer, [employer] [facility] Medicare; submitted request via online portal; received Authorization# [id number]; pending.

## 2015-04-07 NOTE — Telephone Encounter (Signed)
As of 04/06/15, Status "Approved" per [facility] with same Authorization# [id number], for in-patient procedures as noted, 04/28/15, [facility], for 2-day stay; if additional day(s) are needed, hospital will be contacted regarding providing clinicals.

## 2015-04-21 NOTE — Patient Instructions (Signed)
Doris Riley  04/21/2015     @   Your procedure is scheduled on 04/28/2015.  Report to [facility] at 8:50 A.M.  Call this number if you have problems the morning of surgery:  [phone]   Remember:  Do not eat food or drink liquids after midnight.  Take these medicines the morning of surgery with A SIP OF WATER : Lotrel, Lisinopril and Tylenol #3  Do not wear jewelry, make-up or nail polish.  Do not wear lotions, powders, or perfumes.  You may wear deodorant.  Do not shave 48 hours prior to surgery.  Men may shave face and neck.  Do not bring valuables to the hospital.  [facility] is not responsible for any belongings or valuables.  Contacts, dentures or bridgework may not be worn into surgery.  Leave your suitcase in the car.  After surgery it may be brought to your room.  For patients admitted to the hospital, discharge time will be determined by your treatment team.  Patients discharged the day of surgery will not be allowed to drive home.   Name and phone number of your driver:   family Special instructions:  n/a  Please read over the following fact sheets that you were given. Care and Recovery After Surgery   Total Knee Replacement Total knee replacement is a procedure to replace your knee joint with an artificial knee joint (prosthetic knee joint). The purpose of this surgery is to reduce pain and improve your knee function. LET [facility] CARE PROVIDER KNOW ABOUT:   Any allergies you have.  All medicines you are taking, including vitamins, herbs, eye drops, creams, and over-the-counter medicines.  Previous problems you or members of your family have had with the use of anesthetics.  Any blood disorders you have.  Previous surgeries you have had.  Medical conditions you have. RISKS AND COMPLICATIONS  Generally, total knee replacement is a safe procedure. However, problems can occur and include:  Loss of range of motion of the knee or  instability.  Loosening of the prosthesis.  Infection.  Persistent pain. BEFORE THE PROCEDURE   Do not eat or drink anything after midnight on the night before the procedure or as directed by your health care provider.  Ask your health care provider about changing or stopping your regular medicines. This is especially important if you are taking diabetes medicines or blood thinners. PROCEDURE   Just before the procedure, you will receive medicine that will make you drowsy (sedative). This will be given through a tube that is inserted into one of your veins (IV tube).  Then you will be given one of the following:  A medicine injected into your spine that numbs your body below the waist (spinal anesthetic).  A medicine that makes you fall asleep (general anesthetic).  You may also receive medicine to block feeling in your leg (nerve block) to help ease pain after surgery.  An incision will be made in your knee. Your surgeon will take out any damaged cartilage and bone by sawing off the damaged surfaces.  The surgeon will then put a new metal liner over the sawed-off portion of your thigh bone (femur) and a plastic liner over the sawed-off portion of one of the bones of your lower leg (tibia). This is to restore alignment and function to your knee. A plastic piece is often used to restore the surface of your knee cap. AFTER THE PROCEDURE   You will be taken to  the recovery area.  You may have drainage tubes to drain excess fluid from your knee. These tubes attach to a device that removes these fluids.  Once you are awake, stable, and taking fluids well, you will be taken to your hospital room.  You will receive physical therapy as prescribed by your health care provider.  Your surgeon may recommend that you spend time (usually an additional 10-14 days) in an extended-care facility to help you begin walking again and improve your range of motion before you go home.  You may also be  prescribed blood-thinning medicine to decrease your risk of developing blood clots in your leg. Document Released: 01/15/2001 Document Revised: 02/23/2014 Document Reviewed: 11/19/2011 [facility] Patient Information 2015 [employer] [place]. This information is not intended to replace advice given to you by your health care provider. Make sure you discuss any questions you have with your health care provider.

## 2015-04-22 ENCOUNTER — Encounter (HOSPITAL_COMMUNITY): Payer: Self-pay

## 2015-04-22 ENCOUNTER — Encounter (HOSPITAL_COMMUNITY)
Admission: RE | Admit: 2015-04-22 | Discharge: 2015-04-22 | Disposition: A | Discharge status: Home / Self Care | Payer: Commercial Managed Care - HMO | Source: Ambulatory Visit | Attending: Orthopedic Surgery | Admitting: Orthopedic Surgery

## 2015-04-22 DIAGNOSIS — M129 Arthropathy, unspecified: Secondary | ICD-10-CM | POA: Diagnosis not present

## 2015-04-22 DIAGNOSIS — S82142S Displaced bicondylar fracture of left tibia, sequela: Secondary | ICD-10-CM | POA: Diagnosis not present

## 2015-04-22 DIAGNOSIS — Z01818 Encounter for other preprocedural examination: Secondary | ICD-10-CM | POA: Insufficient documentation

## 2015-04-22 HISTORY — DX: Unspecified osteoarthritis, unspecified site: M19.90

## 2015-04-22 HISTORY — DX: Major depressive disorder, single episode, unspecified: F32.9

## 2015-04-22 HISTORY — DX: Depression, unspecified: F32.A

## 2015-04-22 LAB — CBC WITH DIFFERENTIAL/PLATELET
Basophils Absolute: 0 10*3/uL (ref 0.0–0.1)
Basophils Relative: 1 % (ref 0–1)
EOS PCT: 2 % (ref 0–5)
Eosinophils Absolute: 0.1 10*3/uL (ref 0.0–0.7)
HEMATOCRIT: 41.6 % (ref 36.0–46.0)
HEMOGLOBIN: 13.6 g/dL (ref 12.0–15.0)
LYMPHS PCT: 21 % (ref 12–46)
Lymphs Abs: 1.4 10*3/uL (ref 0.7–4.0)
MCH: 28.9 pg (ref 26.0–34.0)
MCHC: 32.7 g/dL (ref 30.0–36.0)
MCV: 88.3 fL (ref 78.0–100.0)
Monocytes Absolute: 0.6 10*3/uL (ref 0.1–1.0)
Monocytes Relative: 9 % (ref 3–12)
NEUTROS ABS: 4.5 10*3/uL (ref 1.7–7.7)
NEUTROS PCT: 67 % (ref 43–77)
Platelets: 406 10*3/uL — ABNORMAL HIGH (ref 150–400)
RBC: 4.71 MIL/uL (ref 3.87–5.11)
RDW: 12.9 % (ref 11.5–15.5)
WBC: 6.6 10*3/uL (ref 4.0–10.5)

## 2015-04-22 LAB — BASIC METABOLIC PANEL
ANION GAP: 10 (ref 5–15)
BUN: 10 mg/dL (ref 6–20)
CO2: 29 mmol/L (ref 22–32)
Calcium: 9.9 mg/dL (ref 8.9–10.3)
Chloride: 100 mmol/L — ABNORMAL LOW (ref 101–111)
Creatinine, Ser: 0.83 mg/dL (ref 0.44–1.00)
GFR calc Af Amer: >60 mL/min (ref >60)
GFR calc non Af Amer: >60 mL/min (ref >60)
Glucose, Bld: 94 mg/dL (ref 65–99)
Potassium: 4.2 mmol/L (ref 3.5–5.1)
Sodium: 139 mmol/L (ref 135–145)

## 2015-04-22 LAB — SURGICAL PCR SCREEN
MRSA, PCR: NEGATIVE
Staphylococcus aureus: NEGATIVE

## 2015-04-22 LAB — PROTIME-INR
INR: 1.02 (ref 0.00–1.49)
Prothrombin Time: 13.6 seconds (ref 11.6–15.2)

## 2015-04-22 LAB — APTT: aPTT: 34 seconds (ref 24–37)

## 2015-04-22 NOTE — Pre-Procedure Instructions (Signed)
Patient given information to sign up for my chart at home. Patient also viewed Emmi while here.

## 2015-04-25 LAB — PREPARE RBC (CROSSMATCH)

## 2015-04-25 LAB — ABO/RH: ABO/RH(D): O POS

## 2015-04-27 NOTE — H&P (Signed)
Doris Riley is a 74 y.o. female.   HPI 74 year old female fractured her left tibial plateau several years back she did well up until this January where she started having increasing medial knee pain and pain with activity and activities of daily living. She did start with Aleve and went ibuprofen glucosamine with not good relief. She likes to dance and has had trouble doing that. She does not complain of catching locking but complains of a popping sensation in activity related pain both knees left worse than right left knee medial pain  This patient had a tibial plateau fracture in 2005 we treated her with a Synthes plate and bone graft elevation and internal fixation she did well but developed arthritis primarily actually in the medial compartment and presents for left total knee replacement and an hardware removal  I think we can do a one stage procedure as long as there is no infection or bony compromise. I discussed this with her. I wrote it out for her. I showed her pictures and models. She is at [facility] as she only has 85 of knee flexion but she does have full extension. She has no signs of infection on exam  I think we can use a stemmed implant with a revision type system and reconstruct her knee after removal of hardware. I will assess the bone first stability and structure and if we can't do it in one setting she understands that we will require a second procedure  Review of systems Recent fever or chills she rode's positive visual disturbance ankle swelling on the left constipation depression joint pain stiff joints lactose intolerance otherwise normal    Past Medical History   Diagnosis  Date   .  Hyperlipidemia     .  Hypertension     .  Anemia         mild   .  Constipation         Past Surgical History   Procedure  Laterality  Date   .  S/p hysterectomy           partial   .  Knee surgery           left, fracture   .  Colonoscopy           >10 years ago   .   Abdominal hysterectomy       .  Colonoscopy    09/01/2011       Procedure: COLONOSCOPY;  Surgeon: Arlyce HarmanSandi M Fields, MD;  Location: [facility];  Service: Endoscopy;  Laterality: N/A;  10:30       Family History   Problem  Relation  Age of Onset   .  Colon cancer  Mother         6940s   .  Prostate cancer  Father     .  GI problems  Brother         colostomy, patient not sure why   .  Colon cancer  Brother       Social History History   Substance Use Topics   .  Smoking status:  Never Smoker    .  Smokeless tobacco:  Not on file   .  Alcohol Use:  Yes         Comment: half a glass a week     No Known Allergies    Current Outpatient Prescriptions   Medication  Sig  Dispense  Refill   .  acetaminophen (TYLENOL) 500 MG tablet  Take 500 mg by mouth every 6 (six) hours as needed. Pain       .  ezetimibe (ZETIA) 10 MG tablet  Take 10 mg by mouth daily.       .  Multiple Vitamin (ONE-DAILY MULTI VITAMINS PO)  Take 1 tablet by mouth daily.         Marland Kitchen  acetaminophen-codeine (TYLENOL #3) 300-30 MG per tablet  Take 1 tablet by mouth every 6 (six) hours as needed for moderate pain.  60 tablet  2   .  atorvastatin (LIPITOR) 10 MG tablet  Take 10 mg by mouth daily.       .  Biotin 5000 MCG TABS  Take 1 tablet by mouth daily.       .  Cyanocobalamin (VITAMIN B 12 PO)  Take 1 tablet by mouth daily.       Chilton Si Tea, Camillia sinensis, (GREEN TEA PO)  Take 8 oz by mouth every evening.       .  IRON PO  Take 65 mg by mouth daily.       Marland Kitchen  lisinopril (PRINIVIL,ZESTRIL) 40 MG tablet  Take 40 mg by mouth daily.          No current facility-administered medications for this visit.        Physical Exam Pulse 76, resp. rate 20, weight 190 lb (86.183 kg). Physical Exam The patient is well developed well nourished and well groomed. Orientation to person place and time is normal   Mood is pleasant. Ambulatory status she walks fairly well she has a valgus alignment to the right knee and varus to  the left Upper extremities examination of the upper extremities reveal no palpable deformities or malalignments are visual. She has full range of motion normal strength and stability to all the joints. The scans intact motor exam is normal there is good muscle tone normal sensation and pulses which are equal bilaterally  Her right knee flexion is approximately 120 counting for the flexion contracture. It is stable it is in valgus she has excellent muscle tone skin pulse and sensation lymph nodes are negative Left knee flexion is limited to 90 is a 5 flexion contracture the incision is slightly lateral to the patella and has healed nicely. The knee is otherwise stable good muscle tone normal extension power skin is intact pulses are good sensation is normal and lymph nodes are negative  Data Reviewed X-rays were done at the hospital. I interpret the first left knee x-ray as varus arthritis grade 4 with plate fixation of previous tibial plateau fracture with no complications from the plateau fracture which was a lateral fracture   Left total knee replacement after hardware removal if bone quality and joint looks appropriate for 2 procedures at one setting

## 2015-04-28 ENCOUNTER — Inpatient Hospital Stay (HOSPITAL_COMMUNITY): Payer: Commercial Managed Care - HMO | Admitting: Anesthesiology

## 2015-04-28 ENCOUNTER — Encounter (HOSPITAL_COMMUNITY)
Admission: RE | Disposition: A | Discharge status: Home Health | Payer: Self-pay | Source: Ambulatory Visit | Attending: Orthopedic Surgery

## 2015-04-28 ENCOUNTER — Encounter (HOSPITAL_COMMUNITY): Payer: Self-pay | Admitting: *Deleted

## 2015-04-28 ENCOUNTER — Inpatient Hospital Stay (HOSPITAL_COMMUNITY)
Admission: RE | Admit: 2015-04-28 | Discharge: 2015-05-01 | DRG: 470 | Disposition: A | Discharge status: Home Health | Payer: Commercial Managed Care - HMO | Source: Ambulatory Visit | Attending: Orthopedic Surgery | Admitting: Orthopedic Surgery

## 2015-04-28 ENCOUNTER — Inpatient Hospital Stay (HOSPITAL_COMMUNITY): Payer: Commercial Managed Care - HMO

## 2015-04-28 DIAGNOSIS — I10 Essential (primary) hypertension: Secondary | ICD-10-CM | POA: Diagnosis present

## 2015-04-28 DIAGNOSIS — Z800 Family history of malignant neoplasm of digestive organs: Secondary | ICD-10-CM | POA: Diagnosis not present

## 2015-04-28 DIAGNOSIS — S82142S Displaced bicondylar fracture of left tibia, sequela: Secondary | ICD-10-CM

## 2015-04-28 DIAGNOSIS — S82143A Displaced bicondylar fracture of unspecified tibia, initial encounter for closed fracture: Secondary | ICD-10-CM | POA: Insufficient documentation

## 2015-04-28 DIAGNOSIS — Z96652 Presence of left artificial knee joint: Secondary | ICD-10-CM | POA: Insufficient documentation

## 2015-04-28 DIAGNOSIS — Z471 Aftercare following joint replacement surgery: Secondary | ICD-10-CM | POA: Diagnosis not present

## 2015-04-28 DIAGNOSIS — M1712 Unilateral primary osteoarthritis, left knee: Principal | ICD-10-CM

## 2015-04-28 DIAGNOSIS — Z09 Encounter for follow-up examination after completed treatment for conditions other than malignant neoplasm: Secondary | ICD-10-CM

## 2015-04-28 DIAGNOSIS — M1710 Unilateral primary osteoarthritis, unspecified knee: Secondary | ICD-10-CM | POA: Diagnosis present

## 2015-04-28 DIAGNOSIS — S82142A Displaced bicondylar fracture of left tibia, initial encounter for closed fracture: Secondary | ICD-10-CM | POA: Diagnosis not present

## 2015-04-28 DIAGNOSIS — M179 Osteoarthritis of knee, unspecified: Secondary | ICD-10-CM | POA: Diagnosis present

## 2015-04-28 DIAGNOSIS — E785 Hyperlipidemia, unspecified: Secondary | ICD-10-CM | POA: Diagnosis present

## 2015-04-28 HISTORY — PX: HARDWARE REMOVAL: SHX979

## 2015-04-28 HISTORY — PX: TOTAL KNEE ARTHROPLASTY: SHX125

## 2015-04-28 SURGERY — REMOVAL, HARDWARE
Anesthesia: Spinal | Site: Knee | Laterality: Left

## 2015-04-28 MED ORDER — PHENOL 1.4 % MT LIQD: 1.0000 | OROMUCOSAL | Status: DC | PRN

## 2015-04-28 MED ORDER — MIDAZOLAM HCL 5 MG/5ML IJ SOLN
INTRAMUSCULAR | Status: DC | PRN
  Administered 2015-04-28 (×2): 1 mg via INTRAVENOUS

## 2015-04-28 MED ORDER — DOCUSATE SODIUM 100 MG PO CAPS
100.0000 mg | ORAL_CAPSULE | Freq: Two times a day (BID) | ORAL | Status: DC
  Administered 2015-04-28 – 2015-05-01 (×6): 100 mg via ORAL
  Filled 2015-04-28 (×6): qty 1

## 2015-04-28 MED ORDER — SODIUM CHLORIDE 0.9 % IJ SOLN
INTRAMUSCULAR | Status: AC
  Filled 2015-04-28: qty 10

## 2015-04-28 MED ORDER — FENTANYL CITRATE (PF) 100 MCG/2ML IJ SOLN
INTRAMUSCULAR | Status: AC
  Filled 2015-04-28: qty 2

## 2015-04-28 MED ORDER — METOCLOPRAMIDE HCL 5 MG/ML IJ SOLN: 5.0000 mg | Freq: Three times a day (TID) | INTRAMUSCULAR | Status: DC | PRN

## 2015-04-28 MED ORDER — EPHEDRINE SULFATE 50 MG/ML IJ SOLN
INTRAMUSCULAR | Status: AC
  Filled 2015-04-28: qty 1

## 2015-04-28 MED ORDER — HYDROMORPHONE HCL 1 MG/ML IJ SOLN
0.5000 mg | INTRAMUSCULAR | Status: DC | PRN
  Administered 2015-04-28: 0.5 mg via INTRAVENOUS
  Filled 2015-04-28: qty 1

## 2015-04-28 MED ORDER — AMLODIPINE BESY-BENAZEPRIL HCL 10-40 MG PO CAPS: 1.0000 | ORAL_CAPSULE | Freq: Every day | ORAL | Status: DC

## 2015-04-28 MED ORDER — PREGABALIN 50 MG PO CAPS
ORAL_CAPSULE | ORAL | Status: AC
  Filled 2015-04-28: qty 1

## 2015-04-28 MED ORDER — OXYCODONE HCL 5 MG PO TABS: 5.0000 mg | ORAL_TABLET | ORAL | Status: DC | PRN

## 2015-04-28 MED ORDER — BENAZEPRIL HCL 10 MG PO TABS
40.0000 mg | ORAL_TABLET | Freq: Every day | ORAL | Status: DC
  Administered 2015-04-29 – 2015-05-01 (×3): 40 mg via ORAL
  Filled 2015-04-28 (×3): qty 4

## 2015-04-28 MED ORDER — POLYETHYLENE GLYCOL 3350 17 G PO PACK
17.0000 g | PACK | Freq: Every day | ORAL | Status: DC
  Administered 2015-04-28 – 2015-05-01 (×4): 17 g via ORAL
  Filled 2015-04-28 (×4): qty 1

## 2015-04-28 MED ORDER — METHOCARBAMOL 500 MG PO TABS
500.0000 mg | ORAL_TABLET | Freq: Four times a day (QID) | ORAL | Status: DC | PRN
  Administered 2015-04-28: 500 mg via ORAL
  Filled 2015-04-28: qty 1

## 2015-04-28 MED ORDER — OXYCODONE HCL 5 MG PO TABS
5.0000 mg | ORAL_TABLET | Freq: Once | ORAL | Status: AC
  Administered 2015-04-28: 5 mg via ORAL

## 2015-04-28 MED ORDER — ADULT MULTIVITAMIN W/MINERALS CH
1.0000 | ORAL_TABLET | Freq: Every day | ORAL | Status: DC
  Administered 2015-04-28 – 2015-05-01 (×4): 1 via ORAL
  Filled 2015-04-28 (×4): qty 1

## 2015-04-28 MED ORDER — CEFAZOLIN SODIUM-DEXTROSE 2-3 GM-% IV SOLR
INTRAVENOUS | Status: AC
  Filled 2015-04-28: qty 50

## 2015-04-28 MED ORDER — BUPIVACAINE IN DEXTROSE 0.75-8.25 % IT SOLN
INTRATHECAL | Status: AC
  Filled 2015-04-28: qty 2

## 2015-04-28 MED ORDER — BUPIVACAINE IN DEXTROSE 0.75-8.25 % IT SOLN
INTRATHECAL | Status: DC | PRN
  Administered 2015-04-28: 15 mg via INTRATHECAL

## 2015-04-28 MED ORDER — DIPHENHYDRAMINE HCL 12.5 MG/5ML PO ELIX: 12.5000 mg | ORAL_SOLUTION | ORAL | Status: DC | PRN

## 2015-04-28 MED ORDER — PROPOFOL 10 MG/ML IV BOLUS
INTRAVENOUS | Status: AC
  Filled 2015-04-28: qty 20

## 2015-04-28 MED ORDER — TRANEXAMIC ACID 1000 MG/10ML IV SOLN
1000.0000 mg | INTRAVENOUS | Status: AC
  Administered 2015-04-28: 1000 mg via INTRAVENOUS
  Filled 2015-04-28: qty 10

## 2015-04-28 MED ORDER — CELECOXIB 100 MG PO CAPS
200.0000 mg | ORAL_CAPSULE | Freq: Two times a day (BID) | ORAL | Status: DC
  Administered 2015-04-28 – 2015-05-01 (×6): 200 mg via ORAL
  Filled 2015-04-28 (×6): qty 2

## 2015-04-28 MED ORDER — ONDANSETRON HCL 4 MG/2ML IJ SOLN
4.0000 mg | Freq: Once | INTRAMUSCULAR | Status: AC
Start: 2015-04-28 — End: 2015-04-28
  Administered 2015-04-28: 4 mg via INTRAVENOUS

## 2015-04-28 MED ORDER — ONDANSETRON HCL 4 MG/2ML IJ SOLN: 4.0000 mg | Freq: Four times a day (QID) | INTRAMUSCULAR | Status: DC | PRN

## 2015-04-28 MED ORDER — SODIUM CHLORIDE 0.9 % IJ SOLN
INTRAMUSCULAR | Status: AC
  Filled 2015-04-28: qty 40

## 2015-04-28 MED ORDER — FENTANYL CITRATE (PF) 100 MCG/2ML IJ SOLN
25.0000 ug | INTRAMUSCULAR | Status: DC | PRN
  Administered 2015-04-28: 50 ug via INTRAVENOUS

## 2015-04-28 MED ORDER — EZETIMIBE 10 MG PO TABS
10.0000 mg | ORAL_TABLET | Freq: Every day | ORAL | Status: DC
  Administered 2015-04-28 – 2015-05-01 (×4): 10 mg via ORAL
  Filled 2015-04-28 (×4): qty 1

## 2015-04-28 MED ORDER — OXYCODONE HCL 5 MG PO TABS
ORAL_TABLET | ORAL | Status: AC
  Filled 2015-04-28: qty 1

## 2015-04-28 MED ORDER — LISINOPRIL 10 MG PO TABS
40.0000 mg | ORAL_TABLET | Freq: Every day | ORAL | Status: DC
  Administered 2015-04-29 – 2015-05-01 (×3): 40 mg via ORAL
  Filled 2015-04-28 (×3): qty 4

## 2015-04-28 MED ORDER — METHOCARBAMOL 1000 MG/10ML IJ SOLN
500.0000 mg | Freq: Four times a day (QID) | INTRAMUSCULAR | Status: DC | PRN
  Filled 2015-04-28: qty 5

## 2015-04-28 MED ORDER — METOCLOPRAMIDE HCL 10 MG PO TABS: 5.0000 mg | ORAL_TABLET | Freq: Three times a day (TID) | ORAL | Status: DC | PRN

## 2015-04-28 MED ORDER — MENTHOL 3 MG MT LOZG
1.0000 | LOZENGE | OROMUCOSAL | Status: DC | PRN
Start: 2015-04-28 — End: 2015-05-01

## 2015-04-28 MED ORDER — PROPOFOL INFUSION 10 MG/ML OPTIME
INTRAVENOUS | Status: DC | PRN
  Administered 2015-04-28 (×2): via INTRAVENOUS
  Administered 2015-04-28: 35 ug/kg/min via INTRAVENOUS

## 2015-04-28 MED ORDER — PREGABALIN 50 MG PO CAPS
50.0000 mg | ORAL_CAPSULE | Freq: Three times a day (TID) | ORAL | Status: DC
Start: 2015-04-28 — End: 2015-04-29
  Administered 2015-04-28: 50 mg via ORAL
  Filled 2015-04-28 (×2): qty 1

## 2015-04-28 MED ORDER — SENNA 8.6 MG PO TABS
1.0000 | ORAL_TABLET | Freq: Two times a day (BID) | ORAL | Status: DC
  Administered 2015-04-28 – 2015-05-01 (×6): 8.6 mg via ORAL
  Filled 2015-04-28 (×6): qty 1

## 2015-04-28 MED ORDER — EPHEDRINE SULFATE 50 MG/ML IJ SOLN
INTRAMUSCULAR | Status: DC | PRN
  Administered 2015-04-28: 10 mg via INTRAVENOUS

## 2015-04-28 MED ORDER — ONDANSETRON HCL 4 MG PO TABS: 4.0000 mg | ORAL_TABLET | Freq: Four times a day (QID) | ORAL | Status: DC | PRN

## 2015-04-28 MED ORDER — CELECOXIB 400 MG PO CAPS
400.0000 mg | ORAL_CAPSULE | Freq: Once | ORAL | Status: AC
  Administered 2015-04-28: 400 mg via ORAL

## 2015-04-28 MED ORDER — BIOTIN 5000 MCG PO TABS: 1.0000 | ORAL_TABLET | Freq: Every day | ORAL | Status: DC

## 2015-04-28 MED ORDER — ASPIRIN EC 325 MG PO TBEC
325.0000 mg | DELAYED_RELEASE_TABLET | Freq: Two times a day (BID) | ORAL | Status: DC
  Administered 2015-04-28 – 2015-05-01 (×6): 325 mg via ORAL
  Filled 2015-04-28 (×5): qty 1

## 2015-04-28 MED ORDER — PREGABALIN 50 MG PO CAPS
50.0000 mg | ORAL_CAPSULE | Freq: Three times a day (TID) | ORAL | Status: DC
  Administered 2015-04-28 – 2015-05-01 (×8): 50 mg via ORAL
  Filled 2015-04-28 (×2): qty 1
  Filled 2015-04-28: qty 2
  Filled 2015-04-28 (×4): qty 1

## 2015-04-28 MED ORDER — MAGNESIUM CITRATE PO SOLN: 1.0000 | Freq: Once | ORAL | Status: AC | PRN

## 2015-04-28 MED ORDER — MIDAZOLAM HCL 2 MG/2ML IJ SOLN
INTRAMUSCULAR | Status: AC
  Filled 2015-04-28: qty 2

## 2015-04-28 MED ORDER — AMLODIPINE BESYLATE 5 MG PO TABS
10.0000 mg | ORAL_TABLET | Freq: Every day | ORAL | Status: DC
  Administered 2015-04-29 – 2015-05-01 (×3): 10 mg via ORAL
  Filled 2015-04-28 (×3): qty 2

## 2015-04-28 MED ORDER — METHOCARBAMOL 1000 MG/10ML IJ SOLN
500.0000 mg | Freq: Once | INTRAMUSCULAR | Status: DC
  Administered 2015-04-28: 500 mg via INTRAVENOUS
  Filled 2015-04-28: qty 5

## 2015-04-28 MED ORDER — BUPIVACAINE-EPINEPHRINE (PF) 0.5% -1:200000 IJ SOLN
INTRAMUSCULAR | Status: DC | PRN
  Administered 2015-04-28: 30 mL

## 2015-04-28 MED ORDER — KETOROLAC TROMETHAMINE 15 MG/ML IJ SOLN
15.0000 mg | Freq: Four times a day (QID) | INTRAMUSCULAR | Status: AC
  Administered 2015-04-28 – 2015-04-29 (×4): 15 mg via INTRAVENOUS
  Filled 2015-04-28 (×4): qty 1

## 2015-04-28 MED ORDER — BUPIVACAINE LIPOSOME 1.3 % IJ SUSP
20.0000 mL | Freq: Once | INTRAMUSCULAR | Status: DC
  Filled 2015-04-28: qty 20

## 2015-04-28 MED ORDER — ATORVASTATIN CALCIUM 10 MG PO TABS
10.0000 mg | ORAL_TABLET | Freq: Every day | ORAL | Status: DC
  Administered 2015-04-28 – 2015-05-01 (×4): 10 mg via ORAL
  Filled 2015-04-28 (×4): qty 1

## 2015-04-28 MED ORDER — SODIUM CHLORIDE 0.9 % IV SOLN
INTRAVENOUS | Status: DC
  Administered 2015-04-28 – 2015-04-30 (×3): via INTRAVENOUS

## 2015-04-28 MED ORDER — ALUM & MAG HYDROXIDE-SIMETH 200-200-20 MG/5ML PO SUSP: 30.0000 mL | ORAL | Status: DC | PRN

## 2015-04-28 MED ORDER — CEFAZOLIN SODIUM-DEXTROSE 2-3 GM-% IV SOLR
2.0000 g | INTRAVENOUS | Status: AC
  Administered 2015-04-28: 2 g via INTRAVENOUS

## 2015-04-28 MED ORDER — FENTANYL CITRATE (PF) 100 MCG/2ML IJ SOLN
25.0000 ug | INTRAMUSCULAR | Status: DC
  Administered 2015-04-28: 25 ug via INTRAVENOUS

## 2015-04-28 MED ORDER — CEFAZOLIN SODIUM-DEXTROSE 2-3 GM-% IV SOLR
2.0000 g | Freq: Four times a day (QID) | INTRAVENOUS | Status: AC
  Administered 2015-04-28 (×2): 2 g via INTRAVENOUS
  Filled 2015-04-28 (×2): qty 50

## 2015-04-28 MED ORDER — ARTIFICIAL TEARS OP OINT
TOPICAL_OINTMENT | OPHTHALMIC | Status: AC
  Filled 2015-04-28: qty 7

## 2015-04-28 MED ORDER — LACTATED RINGERS IV SOLN
INTRAVENOUS | Status: DC
  Administered 2015-04-28: 13:00:00 via INTRAVENOUS
  Administered 2015-04-28 (×2): 1000 mL via INTRAVENOUS

## 2015-04-28 MED ORDER — BUPIVACAINE LIPOSOME 1.3 % IJ SUSP
INTRAMUSCULAR | Status: AC
  Filled 2015-04-28: qty 20

## 2015-04-28 MED ORDER — ONDANSETRON HCL 4 MG/2ML IJ SOLN: 4.0000 mg | Freq: Once | INTRAMUSCULAR | Status: DC | PRN

## 2015-04-28 MED ORDER — HYDROCODONE-ACETAMINOPHEN 10-325 MG PO TABS
1.0000 | ORAL_TABLET | ORAL | Status: DC
  Administered 2015-04-28 – 2015-05-01 (×17): 1 via ORAL
  Filled 2015-04-28 (×18): qty 1

## 2015-04-28 MED ORDER — MIDAZOLAM HCL 2 MG/2ML IJ SOLN
1.0000 mg | INTRAMUSCULAR | Status: DC | PRN
  Administered 2015-04-28: 2 mg via INTRAVENOUS

## 2015-04-28 MED ORDER — FENTANYL CITRATE (PF) 100 MCG/2ML IJ SOLN
INTRAMUSCULAR | Status: DC | PRN
  Administered 2015-04-28 (×4): 25 ug via INTRAVENOUS

## 2015-04-28 MED ORDER — BISACODYL 10 MG RE SUPP: 10.0000 mg | Freq: Every day | RECTAL | Status: DC | PRN

## 2015-04-28 MED ORDER — ACETAMINOPHEN 325 MG PO TABS: 650.0000 mg | ORAL_TABLET | Freq: Four times a day (QID) | ORAL | Status: DC | PRN

## 2015-04-28 MED ORDER — SODIUM CHLORIDE 0.9 % IV SOLN
INTRAVENOUS | Status: DC | PRN
  Administered 2015-04-28: 60 mL

## 2015-04-28 MED ORDER — DEXAMETHASONE SODIUM PHOSPHATE 4 MG/ML IJ SOLN
10.0000 mg | Freq: Once | INTRAMUSCULAR | Status: AC
  Administered 2015-04-29: 10 mg via INTRAVENOUS
  Filled 2015-04-28: qty 3

## 2015-04-28 MED ORDER — PREGABALIN 50 MG PO CAPS
50.0000 mg | ORAL_CAPSULE | Freq: Once | ORAL | Status: AC
  Administered 2015-04-28: 50 mg via ORAL

## 2015-04-28 MED ORDER — SODIUM CHLORIDE 0.9 % IR SOLN
Status: DC | PRN
  Administered 2015-04-28: 1000 mL
  Administered 2015-04-28: 3000 mL

## 2015-04-28 MED ORDER — DEXTROSE 5 % IV SOLN
500.0000 mg | Freq: Four times a day (QID) | INTRAVENOUS | Status: DC
  Administered 2015-04-28 – 2015-04-29 (×4): 500 mg via INTRAVENOUS
  Filled 2015-04-28 (×3): qty 5

## 2015-04-28 MED ORDER — CELECOXIB 400 MG PO CAPS
ORAL_CAPSULE | ORAL | Status: AC
  Filled 2015-04-28: qty 1

## 2015-04-28 MED ORDER — BUPIVACAINE-EPINEPHRINE (PF) 0.5% -1:200000 IJ SOLN
INTRAMUSCULAR | Status: AC
  Filled 2015-04-28: qty 30

## 2015-04-28 MED ORDER — ACETAMINOPHEN 650 MG RE SUPP: 650.0000 mg | Freq: Four times a day (QID) | RECTAL | Status: DC | PRN

## 2015-04-28 MED ORDER — ONDANSETRON HCL 4 MG/2ML IJ SOLN
INTRAMUSCULAR | Status: AC
  Filled 2015-04-28: qty 2

## 2015-04-28 MED ORDER — CHLORHEXIDINE GLUCONATE 4 % EX LIQD: 60.0000 mL | Freq: Once | CUTANEOUS | Status: DC

## 2015-04-28 SURGICAL SUPPLY — 96 items
BAG HAMPER (MISCELLANEOUS) ×3 IMPLANT
BANDAGE ELASTIC 6 VELCRO NS (GAUZE/BANDAGES/DRESSINGS) IMPLANT
BANDAGE ESMARK 4X12 BL STRL LF (DISPOSABLE) IMPLANT
BANDAGE ESMARK 6X9 LF (GAUZE/BANDAGES/DRESSINGS) ×1 IMPLANT
BIT DRILL 3.2X128 (BIT) ×6 IMPLANT
BIT DRILL 3.2X128MM (BIT) ×3
BLADE 10 SAFETY STRL DISP (BLADE) IMPLANT
BLADE HEX COATED 2.75 (ELECTRODE) ×3 IMPLANT
BNDG ESMARK 4X12 BLUE STRL LF (DISPOSABLE)
BNDG ESMARK 6X9 LF (GAUZE/BANDAGES/DRESSINGS) ×3
BOWL SMART MIX CTS (DISPOSABLE) IMPLANT
CAP KNEE TOTAL 3 SIGMA ×3 IMPLANT
CEMENT HV SMART SET (Cement) ×6 IMPLANT
CHLORAPREP W/TINT 26ML (MISCELLANEOUS) ×3 IMPLANT
CLOTH BEACON ORANGE TIMEOUT ST (SAFETY) ×3 IMPLANT
COOLER CRYO CUFF IC AND MOTOR (MISCELLANEOUS) ×3 IMPLANT
COVER LIGHT HANDLE STERIS (MISCELLANEOUS) ×6 IMPLANT
COVER PROBE W GEL 5X96 (DRAPES) ×3 IMPLANT
CUFF CRYO KNEE18X23 MED (MISCELLANEOUS) ×3 IMPLANT
CUFF TOURNIQUET SINGLE 24IN (TOURNIQUET CUFF) IMPLANT
CUFF TOURNIQUET SINGLE 34IN LL (TOURNIQUET CUFF) ×3 IMPLANT
CUFF TOURNIQUET SINGLE 44IN (TOURNIQUET CUFF) IMPLANT
DECANTER SPIKE VIAL GLASS SM (MISCELLANEOUS) ×3 IMPLANT
DRAPE BACK TABLE (DRAPES) ×3 IMPLANT
DRAPE C-ARM FOLDED MOBILE STRL (DRAPES) ×3 IMPLANT
DRAPE EXTREMITY T 121X128X90 (DRAPE) ×3 IMPLANT
DRAPE OEC MINIVIEW 54X84 (DRAPES) IMPLANT
DRSG MEPILEX BORDER 4X12 (GAUZE/BANDAGES/DRESSINGS) ×6 IMPLANT
DURAPREP 26ML APPLICATOR (WOUND CARE) ×6 IMPLANT
ELECT REM PT RETURN 9FT ADLT (ELECTROSURGICAL) ×3
ELECTRODE REM PT RTRN 9FT ADLT (ELECTROSURGICAL) ×1 IMPLANT
EVACUATOR 3/16  PVC DRAIN (DRAIN) ×2
EVACUATOR 3/16 PVC DRAIN (DRAIN) ×1 IMPLANT
GAUZE XEROFORM 5X9 LF (GAUZE/BANDAGES/DRESSINGS) IMPLANT
GLOVE BIO SURGEON STRL SZ 6.5 (GLOVE) ×2 IMPLANT
GLOVE BIO SURGEONS STRL SZ 6.5 (GLOVE) ×1
GLOVE BIOGEL PI IND STRL 7.0 (GLOVE) ×3 IMPLANT
GLOVE BIOGEL PI IND STRL 7.5 (GLOVE) ×2 IMPLANT
GLOVE BIOGEL PI INDICATOR 7.0 (GLOVE) ×6
GLOVE BIOGEL PI INDICATOR 7.5 (GLOVE) ×4
GLOVE ECLIPSE 6.5 STRL STRAW (GLOVE) ×6 IMPLANT
GLOVE OPTIFIT SS 8.0 STRL (GLOVE) ×3 IMPLANT
GLOVE SKINSENSE NS SZ8.0 LF (GLOVE) ×4
GLOVE SKINSENSE STRL SZ8.0 LF (GLOVE) ×2 IMPLANT
GLOVE SS N UNI LF 8.5 STRL (GLOVE) ×3 IMPLANT
GOWN STRL REUS W/ TWL LRG LVL3 (GOWN DISPOSABLE) ×1 IMPLANT
GOWN STRL REUS W/TWL LRG LVL3 (GOWN DISPOSABLE) ×14 IMPLANT
GOWN STRL REUS W/TWL XL LVL3 (GOWN DISPOSABLE) ×3 IMPLANT
HANDPIECE INTERPULSE COAX TIP (DISPOSABLE) ×2
HOOD W/PEELAWAY (MISCELLANEOUS) ×12 IMPLANT
INST SET MAJOR BONE (KITS) ×3 IMPLANT
INST SET MINOR BONE (KITS) ×3 IMPLANT
IV NS IRRIG 3000ML ARTHROMATIC (IV SOLUTION) ×3 IMPLANT
KIT BLADEGUARD II DBL (SET/KITS/TRAYS/PACK) ×3 IMPLANT
KIT ROOM TURNOVER APOR (KITS) ×3 IMPLANT
MANIFOLD NEPTUNE II (INSTRUMENTS) ×3 IMPLANT
MARKER SKIN DUAL TIP RULER LAB (MISCELLANEOUS) ×3 IMPLANT
NEEDLE HYPO 21X1 ECLIPSE (NEEDLE) IMPLANT
NEEDLE HYPO 21X1.5 SAFETY (NEEDLE) IMPLANT
NEEDLE HYPO 25X1 1.5 SAFETY (NEEDLE) ×3 IMPLANT
NS IRRIG 1000ML POUR BTL (IV SOLUTION) ×3 IMPLANT
PACK BASIC LIMB (CUSTOM PROCEDURE TRAY) ×3 IMPLANT
PACK TOTAL JOINT (CUSTOM PROCEDURE TRAY) ×3 IMPLANT
PAD ABD 5X9 TENDERSORB (GAUZE/BANDAGES/DRESSINGS) IMPLANT
PAD ARMBOARD 7.5X6 YLW CONV (MISCELLANEOUS) ×3 IMPLANT
PAD CAST 4YDX4 CTTN HI CHSV (CAST SUPPLIES) IMPLANT
PAD DANNIFLEX CPM (ORTHOPEDIC SUPPLIES) ×3 IMPLANT
PADDING CAST COTTON 4X4 STRL (CAST SUPPLIES)
PIN TROCAR 3 INCH (PIN) IMPLANT
SAW OSC TIP CART 19.5X105X1.3 (SAW) ×3 IMPLANT
SET BASIN LINEN APH (SET/KITS/TRAYS/PACK) ×3 IMPLANT
SET HNDPC FAN SPRY TIP SCT (DISPOSABLE) ×1 IMPLANT
SPONGE LAP 18X18 X RAY DECT (DISPOSABLE) ×6 IMPLANT
SPONGE LAP 4X18 X RAY DECT (DISPOSABLE) ×3 IMPLANT
STAPLER VISISTAT 35W (STAPLE) ×6 IMPLANT
STEM UNIV REV 115 X 6 FLUTED (Stem) ×3 IMPLANT
SUT BRALON NAB BRD #1 30IN (SUTURE) ×9 IMPLANT
SUT ETHILON 3 0 FSL (SUTURE) IMPLANT
SUT MNCRL 0 VIOLET CTX 36 (SUTURE) ×1 IMPLANT
SUT MON AB 0 CT1 (SUTURE) ×6 IMPLANT
SUT MON AB 2-0 CT1 36 (SUTURE) IMPLANT
SUT MONOCRYL 0 CTX 36 (SUTURE) ×2
SUT PROLENE 4 0 PS 2 18 (SUTURE) IMPLANT
SUT VIC AB 1 CT1 27 (SUTURE) ×6
SUT VIC AB 1 CT1 27XBRD ANTBC (SUTURE) ×2 IMPLANT
SYR 20CC LL (SYRINGE) ×3 IMPLANT
SYR 30ML LL (SYRINGE) ×3 IMPLANT
SYR 3ML LL SCALE MARK (SYRINGE) ×3 IMPLANT
SYR BULB IRRIGATION 50ML (SYRINGE) ×6 IMPLANT
TAPE CLOTH SURG 4X10 WHT LF (GAUZE/BANDAGES/DRESSINGS) ×3 IMPLANT
TOWEL OR 17X26 4PK STRL BLUE (TOWEL DISPOSABLE) ×3 IMPLANT
TOWER CARTRIDGE SMART MIX (DISPOSABLE) ×3 IMPLANT
TRAY FOLEY CATH SILVER 16FR (SET/KITS/TRAYS/PACK) ×3 IMPLANT
TRAY REVISION SZ 4 (Knees) ×3 IMPLANT
WATER STERILE IRR 1000ML POUR (IV SOLUTION) ×6 IMPLANT
YANKAUER SUCT 12FT TUBE ARGYLE (SUCTIONS) ×3 IMPLANT

## 2015-04-28 NOTE — Transfer of Care (Signed)
Immediate Anesthesia Transfer of Care Note  Patient: Doris Riley  Procedure(s) Performed: Procedure(s) with comments: HARDWARE REMOVAL LEFT KNEE (Left)  LEFT TOTAL KNEE ARTHROPLASTY (Left) - depuy implant  Patient Location: PACU  Anesthesia Type:Spinal  Level of Consciousness: awake, alert  and oriented  Airway & Oxygen Therapy: Patient Spontanous Breathing and Patient connected to nasal cannula oxygen  Post-op Assessment: Report given to RN  Post vital signs: Reviewed and stable  Last Vitals:  Filed Vitals:   04/28/15 1451  BP: 111/50  Pulse: 62  Temp: 36.7 C  Resp: 14    Complications: No apparent anesthesia complications

## 2015-04-28 NOTE — Anesthesia Postprocedure Evaluation (Signed)
  Anesthesia Post-op Note  Patient: Doris Riley  Procedure(s) Performed: Procedure(s) with comments: HARDWARE REMOVAL LEFT KNEE (Left)  LEFT TOTAL KNEE ARTHROPLASTY (Left) - depuy implant  Patient Location: PACU  Anesthesia Type:Spinal  Level of Consciousness: awake, alert  and oriented  Airway and Oxygen Therapy: Patient Spontanous Breathing  Post-op Pain: none  Post-op Assessment: Post-op Vital signs reviewed, Patient's Cardiovascular Status Stable, Respiratory Function Stable, Patent Airway and No signs of Nausea or vomiting LLE Motor Response: Purposeful movement LLE Sensation: Increased RLE Motor Response: Purposeful movement RLE Sensation: Increased L Sensory Level:  (diminished) R Sensory Level:  (diminished)  Post-op Vital Signs: Reviewed and stable  Last Vitals:  Filed Vitals:   04/28/15 1515  BP: 110/53  Pulse: 62  Temp:   Resp: 11    Complications: No apparent anesthesia complications

## 2015-04-28 NOTE — Anesthesia Procedure Notes (Signed)
Spinal Patient location during procedure: OR Start time: 04/28/2015 11:59 AM Staffing Resident/CRNA: Joycelyn ManIDACAVAGE, Briah Nary J Preanesthetic Checklist Completed: patient identified, site marked, surgical consent, pre-op evaluation, timeout performed, IV checked, risks and benefits discussed and monitors and equipment checked Spinal Block Patient position: left lateral decubitus Prep: Betadine Patient monitoring: heart rate, cardiac monitor, continuous pulse ox and blood pressure Approach: midline Location: L2-3 Injection technique: single-shot Needle Needle type: Spinocan  Needle gauge: 22 G Assessment Sensory level: T8 Additional Notes CSF brisk and clear  #[id number]       04/2016

## 2015-04-28 NOTE — Op Note (Signed)
Surgical dictation for left total knee 04/28/2015 dictated at 3:55 PM   Preop diagnosis osteoarthritis left knee, status post left tibial plateau fracture with open treatment internal fixation  Postop diagnosis osteoarthritis left knee, status post left tibial plateau fracture with open treatment internal fixation  Surgeon Dr. Romeo Apple [phone]  Assisted by Valetta Close and [facility] page  Anesthetic spinal  Implants DEPUY  SIGMA PS FB   SIZES:    F 3   T 4   P 38  Poly 12.5 rotating platform PS, 115 mm stem  Drains: one Hemovac drain in the joint   Exparel   Marcaine with epinephrine    Operative findings : Severe arthritis of the left knee. Severe varus contracture. Multiple osteophytes. Flexion contracture. Knee flexion 85 under anesthesia. Intact plate    details of procedure:   The patient was identified in the preop holding area and the surgical site was confirmed as the left knee. Chart review and update were completed. The patient was taken to the operating room for spinal anesthesia. After successful spinal anesthesia Foley catheter was inserted. The patient was placed supine on the operating table.   the left leg was prepped with duraPrep and draped sterilely. Timeout was completed. The limb was then exsanguinated a  6 inch Esmarch. The tourniquet was elevated to 300 mmHg. Tranxemic acid was administered in the preop area.  The previous tibial plateau fracture fixation incision was midline to slightly lateral and was used. This incision was taken down to the underlying extensor mechanism and full-thickness flaps were created. We made a curvilinear incision starting at the tibial plateau and included the anterior compartment fascia. After subperiosteal dissection expose the hardware the hardware was removed.  We closed this fascial incision with #1 Vicryls in a loose fashion.  We then made a standard medial arthrotomy and encountered a severely contracted patella fat pad  and scarred extensor mechanism. We placed a pin in the tibial tubercle and proceeded with dissection to include the patellofemoral ligament and excision of the fat pad. We then did a complete medial exposure of the proximal tibia at the joint line, metaphyseal region down to the diaphyseal attachment. We remove the multiple osteophytes there were encountered. We were finally able to flex the knee.   We opened up the notch area using a curved osteotome and then resected the residual anterior cruciate ligament and PCL. We removed the menisci. We created a space between the iliotibial band and the patellar tendon.  We then set the tibial external alignment guide for a neutral cut referencing the lower medial side and settling on 6 mm resection. This cut was beneath the level of the previously placed screws. At this time I made the decision to use a stemmed implant without any wedges.  The tibia was a size 3 or 4,  I decided on a 4 later in the procedure.  We opened up the femur with a drill suction irrigation. We set the distal femoral cut for 11 mm and made that cut using an oscillating saw. We then performed extension gap check and the gap balanced easily with the 12.5. We then measured the femur to a size 3 and then pinned that block in external rotation matching the epicondyles and using Whitesides line  We then did a flexion gap check and I was satisfied with the 12.5 insert balancing the flexion gap.  We made our notch cut using a size 3 femoral notch cutting guide.  We then prepared  the tibia per manufacture technique with hand reaming and then proximal reaming and then punching. We trialed the knee with a 12.5 insert and we obtained full extension and 120 of knee flexion with stability in extension and flexion.  The patella was resected from 25 mm down to 16 mm at 35 patella button was placed. Trial reduction confirmed patellar tracking stable flexion-extension  Next we prepared cement on the  back table. We irrigated the bone dried it thoroughly. We then cemented the implants into place.  We placed the trial 12.5 insert and allow the cement to cure removing excess cement. We then placed a 12.5 insert and obtained a stable reduction which matched our trial reduction.  We injected exparel and Marcaine we placed a Hemovac drain we closed the knee in extension with #1 Bralon suture in interrupted fashion.  We also closed the subcutaneous tissue with 0 Monocryl. We placed staples to reapproximate the skin edges we activated the Hemovac.  We placed sterile dressing. Postoperative film taken in the PACU shows excellent position of all the implants  The patient was taken recovery in stable condition she will have a routine standard postoperative total knee protocol

## 2015-04-28 NOTE — Brief Op Note (Signed)
04/28/2015  2:46 PM  PATIENT:  Doris Riley  74 y.o. female  PRE-OPERATIVE DIAGNOSIS:  tibial plateau fracture, left knee osteoarthritis  POST-OPERATIVE DIAGNOSIS:  tibial plateau fracture, left knee osteoarthritis  PROCEDURE:  Procedure(s) with comments: HARDWARE REMOVAL LEFT KNEE (Left)  LEFT TOTAL KNEE ARTHROPLASTY (Left) - depuy implant   Surgical findings severe varus deformity. Flexion contracture. Hardware.  Implants Depew MBT: 12.5 PS insert for tibial tray, 3 femur , 4 tibial tray, 115 stem uncemented  Assisted by Valetta Closeebbie Dallas  Assisted by Santina Evansatherine page  1 Hemovac drain activated  20 mL of diluted exparel and 30 mL of Marcaine  SURGEON:  Surgeon(s) and Role:    * Vickki HearingStanley E Harrison, MD - Primary  PEBL:  Total I/O In: 1000 [I.V.:1000] Out: 350 [Urine:300; Blood:50]  BLOOD ADMINISTERED:none   SPECIMEN:  No Specimen  DISPOSITION OF SPECIMEN:  N/A  COUNTS:  YES  TOURNIQUET:   Total Tourniquet Time Documented: Thigh (Left) - 130 minutes Total: Thigh (Left) - 130 minutes   DICTATION: .Reubin Milanragon Dictation  PLAN OF CARE: Admit to inpatient   PATIENT DISPOSITION:  PACU - hemodynamically stable.   Delay start of Pharmacological VTE agent (>24hrs) due to surgical blood loss or risk of bleeding: yes

## 2015-04-28 NOTE — Anesthesia Preprocedure Evaluation (Signed)
Anesthesia Evaluation  Patient identified by MRN, date of birth, ID band Patient awake    Reviewed: Allergy & Precautions, NPO status , Patient's Chart, lab work & pertinent test results  Airway Mallampati: I  TM Distance: >3 FB     Dental  (+) Edentulous Upper, Edentulous Lower   Pulmonary neg pulmonary ROS,  breath sounds clear to auscultation        Cardiovascular hypertension, Pt. on medications Rhythm:Regular Rate:Normal     Neuro/Psych PSYCHIATRIC DISORDERS Depression    GI/Hepatic negative GI ROS,   Endo/Other    Renal/GU      Musculoskeletal  (+) Arthritis -,   Abdominal   Peds  Hematology   Anesthesia Other Findings   Reproductive/Obstetrics                             Anesthesia Physical Anesthesia Plan  ASA: II  Anesthesia Plan: Spinal   Post-op Pain Management:    Induction:   Airway Management Planned: Nasal Cannula  Additional Equipment:   Intra-op Plan:   Post-operative Plan:   Informed Consent: I have reviewed the patients History and Physical, chart, labs and discussed the procedure including the risks, benefits and alternatives for the proposed anesthesia with the patient or authorized representative who has indicated his/her understanding and acceptance.     Plan Discussed with:   Anesthesia Plan Comments:         Anesthesia Quick Evaluation

## 2015-04-28 NOTE — Interval H&P Note (Signed)
History and Physical Interval Note:  04/28/2015 11:31 AM  Doris Riley  has presented today for surgery, with the diagnosis of tibial plateau fracture, left knee osteoarthritis  The various methods of treatment have been discussed with the patient and family. After consideration of risks, benefits and other options for treatment, the patient has consented to  Procedure(s) with comments: HARDWARE REMOVAL (Left) TOTAL KNEE ARTHROPLASTY (Left) - depuy implant as a surgical intervention .  The patient's history has been reviewed, patient examined, no change in status, stable for surgery.  I have reviewed the patient's chart and labs.  Questions were answered to the patient's satisfaction.     Fuller CanadaStanley Harrison

## 2015-04-29 ENCOUNTER — Encounter (HOSPITAL_COMMUNITY): Payer: Self-pay | Admitting: Orthopedic Surgery

## 2015-04-29 LAB — CBC
HEMATOCRIT: 32.7 % — AB (ref 36.0–46.0)
Hemoglobin: 10.5 g/dL — ABNORMAL LOW (ref 12.0–15.0)
MCH: 28.5 pg (ref 26.0–34.0)
MCHC: 32.1 g/dL (ref 30.0–36.0)
MCV: 88.9 fL (ref 78.0–100.0)
Platelets: 329 10*3/uL (ref 150–400)
RBC: 3.68 MIL/uL — AB (ref 3.87–5.11)
RDW: 12.9 % (ref 11.5–15.5)
WBC: 8.3 10*3/uL (ref 4.0–10.5)

## 2015-04-29 LAB — BASIC METABOLIC PANEL
ANION GAP: 8 (ref 5–15)
BUN: 7 mg/dL (ref 6–20)
CHLORIDE: 97 mmol/L — AB (ref 101–111)
CO2: 30 mmol/L (ref 22–32)
Calcium: 8.5 mg/dL — ABNORMAL LOW (ref 8.9–10.3)
Creatinine, Ser: 0.75 mg/dL (ref 0.44–1.00)
GFR calc Af Amer: >60 mL/min (ref >60)
Glucose, Bld: 119 mg/dL — ABNORMAL HIGH (ref 65–99)
Potassium: 3.5 mmol/L (ref 3.5–5.1)
Sodium: 135 mmol/L (ref 135–145)

## 2015-04-29 NOTE — Plan of Care (Signed)
Problem: Acute Rehab PT Goals(only PT should resolve) Goal: Patient Will Transfer Sit To/From Stand Pt will transfer sit to/from-stand with RW at ModI without loss-of-balance to demonstrate good safety awareness for independent mobility in home.     Goal: Pt Will Ambulate Pt will ambulate with RW at Supervision using a step-through pattern and equal step length for a distances greater than 200ft to demonstrate the ability to perform safe household distance ambulation at discharge.        

## 2015-04-29 NOTE — Anesthesia Postprocedure Evaluation (Signed)
  Anesthesia Post-op Note  Patient: Deetta PerlaMildred M Baune  Procedure(s) Performed: Procedure(s) with comments: HARDWARE REMOVAL LEFT KNEE (Left)  LEFT TOTAL KNEE ARTHROPLASTY (Left) - depuy implant  Patient Location: Nursing Unit  Anesthesia Type:Spinal  Level of Consciousness: awake, alert  and oriented  Airway and Oxygen Therapy: Patient Spontanous Breathing  Post-op Pain: mild  Post-op Assessment: Post-op Vital signs reviewed, Patient's Cardiovascular Status Stable, Respiratory Function Stable, Patent Airway, No signs of Nausea or vomiting and Adequate PO intake LLE Motor Response: Purposeful movement LLE Sensation: Full sensation RLE Motor Response: Purposeful movement RLE Sensation: No pain L Sensory Level:  (diminished) R Sensory Level:  (diminished)  Post-op Vital Signs: Reviewed and stable  Last Vitals:  Filed Vitals:   04/29/15 0558  BP: 111/47  Pulse: 68  Temp: 36.7 C  Resp: 18    Complications: No apparent anesthesia complications

## 2015-04-29 NOTE — Progress Notes (Signed)
Postoperative note  Postoperative day number  1  Status post left total knee  Vital signs  BP 111/47 mmHg  Pulse 68  Temp(Src) 98 F (36.7 C) (Oral)  Resp 18  Ht 5\' 5"  (1.651 m)  Wt 190 lb (86.183 kg)  BMI 31.62 kg/m2  SpO2 100% No significant complaints today.  Pertinent labs   CBC Latest Ref Rng 04/22/2015 04/24/2011 04/23/2011  WBC 4.0 - 10.5 K/uL 6.6 16.6(H) 20.4(H)  Hemoglobin 12.0 - 15.0 g/dL 09.813.6 1.1(B9.3(L) 10.2(L)  Hematocrit 36.0 - 46.0 % 41.6 27.2(L) 31.2(L)  Platelets 150 - 400 K/uL 406(H) 413(H) 388    Physical exam  Normal neurovascular motor function  Assessment and plan   Start physical therapy Add Cryo/Cuff

## 2015-04-29 NOTE — Clinical Social Work Note (Signed)
CSW received consult for possible SNF. At this time, recommendation is for home with home health. CSW will sign off, but can be reconsulted if needed.  Derenda FennelKara Tyreece Gelles, LCSW [phone]

## 2015-04-29 NOTE — Progress Notes (Signed)
OT Screen Patient Details Name: Doris Riley MRN: [id number] DOB: 28-Aug-1941  OT Screen:     Pt screened for OT needs. Pt reports pain as 4/10 this am. Pt demonstrates good BUE range of motion & strength. Pt reports independence in B/IADL tasks PTA. Pt has good family support system, cousins who check in on her during the day and will assist as needed upon discharge. Pt reports she sat at EOB and dressed/sponge bathed with assistance of cousins this am.  [facility], defer to PT evaluation for placement recommendation. No further acute OT needs at this time, recommend OT evaluation upon discharge to next venue of care.   Ezra SitesLeslie Marlow Hendrie, OTR/L  [phone]  04/29/2015, 9:35 AM

## 2015-04-29 NOTE — Care Management Note (Signed)
Case Management Note  Patient Details  Name: Doris Riley MRN: [id number] Date of Birth: [birth year]  Expected Discharge Date:  05/01/15               Expected Discharge Plan:  Skilled Nursing Facility  In-House Referral:  Clinical Social Work  Discharge planning Services  CM Consult  Post Acute Care Choice:    Choice offered to:  Patient  DME Arranged:    DME Agency:     HH Arranged:    HH Agency:     Status of Service:  In process, will continue to follow  Medicare Important Message Given:    Date Medicare IM Given:    Medicare IM give by:    Date Additional Medicare IM Given:    Additional Medicare Important Message give by:     If discussed at Long Length of Stay Meetings, dates discussed:    Additional Comments: Pt is from home, lives alone and has minimal family support. Pt s/p knee surgery. Pt has not worked with PT yet but is scheduled to do so today. Prior to admission pt planned to go to rehab due to lack of support and limiting home environment. Pt says she has already verified her insurance would pay for it. Pt says she has already been ordered CPM machine and it is at her house. Pt given list of HH providers in case PT recommends HH. CM will cont to follow.  Malcolm Metrohildress, Sahara Fujimoto Demske, RN 04/29/2015, 11:27 AM

## 2015-04-29 NOTE — Care Management Note (Signed)
Case Management Note  Patient Details  Name: Deetta PerlaMildred M Quevedo MRN: [id number] Date of Birth: 07-Oct-1941  Expected Discharge Date:  05/01/15               Expected Discharge Plan:  Home w Home Health Services  In-House Referral:  Clinical Social Work  Discharge planning Services  CM Consult  Post Acute Care Choice:  Durable Medical Equipment, Home Health Choice offered to:  Patient  DME Arranged:  3-N-1, Walker rolling DME Agency:  [facility]  HH [facility]  [facility]  Status of Service:  In process, will continue to follow  Medicare Important Message Given:    Date Medicare IM Given:    Medicare IM give by:    Date Additional Medicare IM Given:    Additional Medicare Important Message give by:     If discussed at Long Length of Stay Meetings, dates discussed:    Additional Comments: After working with PT, [facility]. Pt will need RW and 3 in 1. [facility]. Alroy BailiffLinda Lothian of [facility] made aware of discharge plan and will obtain pt info from chart. Emma, [facility] made aware of DME order and will obtain pt info from chart, deliver RW to pt's room and 3 in 1 to pt's home. CM will cont to follow.  Malcolm Metrohildress, Edell Mesenbrink Demske, RN 04/29/2015, 2:57 PM

## 2015-04-29 NOTE — Progress Notes (Signed)
Foley cath removed per order, postop day one.  Patient tolerated well.  Will continue to monitor.

## 2015-04-29 NOTE — Evaluation (Signed)
Physical Therapy Evaluation Patient Details Name: Doris Riley MRN: [id number] DOB: 03-13-1941 Today's Date: 04/29/2015   History of Present Illness  74 year old female fractured her left tibial plateau several years back she did well up until this January where she started having increasing medial knee pain and pain with activity and activities of daily living. She did start with Aleve and went ibuprofen glucosamine with not good relief. She likes to dance and has had trouble doing that. She does not complain of catching locking but complains of a popping sensation in activity related pain both knees left worse than right left knee medial pain. Presenting today on POD1 p hardware removal and TKA on L .   Clinical Impression  Pt is received semirecumbent in bed upon entry, awake, alert, and eager to participate. No acute distress noted. Pt is A&Ox3 and pleasant. Pt reports zero falls in the last 6 months. Pt strength as screened by MMT is 5/5 throughout with grip strength WFL, but is generally weak in LLE as noted during functional mobility assessment. Pt falls risk is high as evidenced by slow gait speed, poor forward reach, and altered posturing during transfers and mobility. Pt is on RA c saturation WNL.Patient presenting with impairment of strength, range of motion, balance, and activity tolerance, limiting ability to perform ADL and mobility tasks at  baseline level of function. Patient will benefit from skilled intervention to address the above impairments and limitations, in order to restore to prior level of function, improve patient safety upon discharge, and to decrease falls risk.      Follow Up Recommendations Home health PT    Equipment Recommendations  Rolling walker with 5" wheels    Recommendations for Other Services       Precautions / Restrictions Precautions Precautions: Fall Restrictions Weight Bearing Restrictions: Yes LLE Weight Bearing: Weight bearing as tolerated       Mobility  Bed Mobility Overal bed mobility: Modified Independent             General bed mobility comments: Does not appear as limited by pain.   Transfers Overall transfer level: Needs assistance Equipment used: Rolling walker (2 wheeled) Transfers: Sit to/from Stand Sit to Stand: Supervision         General transfer comment: Requires cues for safe use of AD.  Ambulation/Gait Ambulation/Gait assistance: Min guard Ambulation Distance (Feet): 80 Feet Assistive device: Rolling walker (2 wheeled)     Gait velocity interpretation: <1.8 ft/sec, indicative of risk for recurrent falls General Gait Details: Very close to step through gait bilat; steps near equal, difficult assessing TKE during gait. R knee c noted substantial chronic valgus. Pt reports persistent lightheadedness after 50 feet.   Stairs            Wheelchair Mobility    Modified Rankin (Stroke Patients Only)       Balance Overall balance assessment: Modified Independent;No apparent balance deficits (not formally assessed)                                           Pertinent Vitals/Pain Pain Assessment: 0-10 Pain Score: 7  (0/10 at entry, 6/10 c therex, 7/10 p amb. ) Pain Location: L knee Pain Descriptors / Indicators: Aching Pain Intervention(s): Monitored during session;Premedicated before session    Home Living Family/patient expects to be discharged to:: Private residence Living Arrangements: Alone Available Help at  Discharge: Family (cousin ) Type of Home: House Home Access: Level entry     Home Layout: One level Home Equipment: Cane - single point      Prior Function Level of Independence: Independent               Hand Dominance   Dominant Hand: Right    Extremity/Trunk Assessment   Upper Extremity Assessment: Overall WFL for tasks assessed           Lower Extremity Assessment: Overall WFL for tasks assessed;LLE deficits/detail   LLE  Deficits / Details: Mild weakness throughout.     Communication   Communication: No difficulties  Cognition Arousal/Alertness: Awake/alert Behavior During Therapy: WFL for tasks assessed/performed Overall Cognitive Status: Within Functional Limits for tasks assessed                      General Comments      Exercises Total Joint Exercises Ankle Circles/Pumps: AROM;Both;20 reps;Supine Quad Sets: Strengthening;AROM;Left;10 reps Short Arc Quad: Strengthening;AAROM;Left;10 reps;Supine Heel Slides: Left;10 reps;Supine;AAROM Straight Leg Raises: AAROM;Left;10 reps;Supine Goniometric ROM: 10-75 degrees left knee flexion.       Assessment/Plan    PT Assessment Patient needs continued PT services  PT Diagnosis Difficulty walking;Abnormality of gait;Generalized weakness;Acute pain   PT Problem List Decreased strength;Decreased range of motion;Decreased activity tolerance;Decreased balance;Decreased mobility;Pain  PT Treatment Interventions DME instruction;Gait training;Therapeutic activities;Functional mobility training;Therapeutic exercise;Balance training   PT Goals (Current goals can be found in the Care Plan section) Acute Rehab PT Goals Patient Stated Goal: Return to active lifestyle, DC home for rehab PT Goal Formulation: With patient Time For Goal Achievement: 05/13/15 Potential to Achieve Goals: Good    Frequency BID   Barriers to discharge Decreased caregiver support lives alone, has assistance set up intermittently c family.     Co-evaluation               End of Session Equipment Utilized During Treatment: Gait belt Activity Tolerance: Patient tolerated treatment well;Patient limited by pain;Other (comment) (lightheadedness) Patient left: in chair;with call bell/phone within reach;with chair alarm set (will donn CPM p BID session. ) Nurse Communication: Mobility status         Time: [id number] PT Time Calculation (min) (ACUTE ONLY): 28  min   Charges:   PT Evaluation $Initial PT Evaluation Tier I: 1 Procedure PT Treatments $Therapeutic Exercise: 8-22 mins   PT G Codes:        Vickey Boak C 04/29/2015, 12:44 PM  12:46 PM  Rosamaria LintsAllan C Brance Dartt, PT, DPT [place] License # [id number]

## 2015-04-29 NOTE — Progress Notes (Signed)
Physical Therapy Treatment Patient Details Name: Doris Riley MRN: [id number] DOB: [birth year] Today's Date: 04/29/2015    History of Present Illness 74 year old female fractured her left tibial plateau several years back she did well up until this January where she started having increasing medial knee pain and pain with activity and activities of daily living. She did start with Aleve and went ibuprofen glucosamine with not good relief. She likes to dance and has had trouble doing that. She does not complain of catching locking but complains of a popping sensation in activity related pain both knees left worse than right left knee medial pain. Presenting today on POD1 p hardware removal and TKA on L .     PT Comments    Pt tolerating treatment session well, motivated and able to complete entire PT sesssion as planned. Pt continues to make progress toward goals as evidenced by improved ambulation distance and pain control. Pt's greatest limitation continues to be O2 desaturation c activity which continues to limit ability to perform all mobility at baseline function. Patient presenting with impairment of strength, pain, range of motion, balance, and activity tolerance, limiting ability to perform ADL and mobility tasks at  baseline level of function. Patient will benefit from skilled intervention to address the above impairments and limitations, in order to restore to prior level of function, improve patient safety upon discharge, and to decrease caregiver burden.    Follow Up Recommendations  Home health PT     Equipment Recommendations  Rolling walker with 5" wheels    Recommendations for Other Services       Precautions / Restrictions Precautions Precautions: Fall Restrictions Weight Bearing Restrictions: Yes LLE Weight Bearing: Weight bearing as tolerated    Mobility  Bed Mobility Overal bed mobility: Modified Independent             General bed mobility comments: Does  not appear as limited by pain.   Transfers Overall transfer level: Needs assistance Equipment used: Rolling walker (2 wheeled) Transfers: Sit to/from Stand Sit to Stand: Supervision         General transfer comment: Requires cues for safe use of AD.  Ambulation/Gait Ambulation/Gait assistance: Min guard Ambulation Distance (Feet): 112 Feet Assistive device: Rolling walker (2 wheeled)     Gait velocity interpretation: <1.8 ft/sec, indicative of risk for recurrent falls General Gait Details: Very close to step through gait bilat; steps near equal, difficult assessing TKE during gait. R knee c noted substantial chronic valgus. Pt reports persistent lightheadedness after 80 feet, unrelenting, SaO2 asessed at 76%.    Stairs            Wheelchair Mobility    Modified Rankin (Stroke Patients Only)       Balance Overall balance assessment: Modified Independent (Pt appears a bit unsteady c turns, frequently running into equipment in fhallway and room at floor level. )                                  Cognition Arousal/Alertness: Lethargic Behavior During Therapy: WFL for tasks assessed/performed;Flat affect Overall Cognitive Status: Impaired/Different from baseline Area of Impairment: Orientation               General Comments: Pt mildly confused, telling her family that she went home after surgery to wash her shirt, and has returned to hospital in same shirt. Mild difficulty following instructions for quad sets, but eventualyl able to  perform.     Exercises Total Joint Exercises Ankle Circles/Pumps: AROM;Both;20 reps;Supine Quad Sets: Strengthening;AROM;Left;10 reps Short Arc Quad: Strengthening;AAROM;Left;Supine;15 reps Heel Slides: Left;10 reps;Supine;AAROM Hip ABduction/ADduction: AAROM;Left;10 reps;Supine Straight Leg Raises: AAROM;Left;10 reps;Supine    General Comments        Pertinent Vitals/Pain Pain Assessment: 0-10 Pain Score: 7   Pain Location: L knee     Home Living                      Prior Function            PT Goals (current goals can now be found in the care plan section) Acute Rehab PT Goals Patient Stated Goal: Return to active lifestyle, DC home for rehab PT Goal Formulation: With patient Time For Goal Achievement: 05/13/15 Potential to Achieve Goals: Good Progress towards PT goals: Progressing toward goals    Frequency  BID    PT Plan Current plan remains appropriate    Co-evaluation             End of Session Equipment Utilized During Treatment: Gait belt Activity Tolerance: Patient tolerated treatment well;Patient limited by pain;Other (comment) (Desaturating quite a bit on room air. ) Patient left: in bed;in CPM     Time: 1610-9604 PT Time Calculation (min) (ACUTE ONLY): 44 min  Charges:  $Gait Training: 8-22 mins $Therapeutic Exercise: 23-37 mins                    G Codes:      Fidela Cieslak C May 24, 2015, 5:18 PM  5:19 PM  Rosamaria Lints, PT, DPT [place] License # [id number]

## 2015-04-29 NOTE — Addendum Note (Signed)
Addendum  created 04/29/15 0820 by Moshe SalisburyKaren E Ishanvi Mcquitty, CRNA   Modules edited: Notes Section   Notes Section:  File: [id number]

## 2015-04-30 LAB — GLUCOSE, CAPILLARY: Glucose-Capillary: 115 mg/dL — ABNORMAL HIGH (ref 65–99)

## 2015-04-30 LAB — CBC
HEMATOCRIT: 28 % — AB (ref 36.0–46.0)
Hemoglobin: 9.2 g/dL — ABNORMAL LOW (ref 12.0–15.0)
MCH: 28.8 pg (ref 26.0–34.0)
MCHC: 32.9 g/dL (ref 30.0–36.0)
MCV: 87.5 fL (ref 78.0–100.0)
Platelets: 282 10*3/uL (ref 150–400)
RBC: 3.2 MIL/uL — ABNORMAL LOW (ref 3.87–5.11)
RDW: 12.7 % (ref 11.5–15.5)
WBC: 15.3 10*3/uL — AB (ref 4.0–10.5)

## 2015-04-30 NOTE — Progress Notes (Signed)
Physical Therapy Treatment Patient Details Name: Doris PerlaMildred M Lenker MRN: [id number] DOB: Mar 29, 1941 Today's Date: 04/30/2015    History of Present Illness [age] year old female fractured her left tibial plateau several years back she did well up until this January where she started having increasing medial knee pain and pain with activity and activities of daily living. She did start with Aleve and went ibuprofen glucosamine with not good relief. She likes to dance and has had trouble doing that. She does not complain of catching locking but complains of a popping sensation in activity related pain both knees left worse than right left knee medial pain. Presenting today on POD1 p hardware removal and TKA on L .     PT Comments    Pt ambulating with increased speed this afternoon  Follow Up Recommendations  Home health PT     Equipment Recommendations  Rolling walker with 5" wheels       Precautions / Restrictions Precautions Precautions: None Restrictions Weight Bearing Restrictions: Yes LLE Weight Bearing: Weight bearing as tolerated    Mobility  Bed Mobility Overal bed mobility: Modified Independent                Transfers Overall transfer level: Modified independent Equipment used: Rolling walker (2 wheeled) Transfers: Sit to/from Stand Sit to Stand: Modified independent (Device/Increase time) (Pt found standing with walker in her room as therapist entered stating that she was looking for a phone.) Stand pivot transfers: Modified independent (Device/Increase time)          Ambulation/Gait Ambulation/Gait assistance: Modified independent (Device/Increase time) Ambulation Distance (Feet): 180 Feet Assistive device: Rolling walker (2 wheeled) Gait Pattern/deviations: Step-through pattern   Gait velocity interpretation: at or above normal speed for age/gender            Cognition Arousal/Alertness: Awake/alert Behavior During Therapy: WFL for tasks  assessed/performed Overall Cognitive Status: Within Functional Limits for tasks assessed Area of Impairment: Orientation                           Pertinent Vitals/Pain Pain Score: 4  Pain Location: Lt knee Pain Descriptors / Indicators: Aching Pain Intervention(s): Monitored during session       Prior Function   I          PT Goals (current goals can now be found in the care plan section) Progress towards PT goals: Progressing toward goals    Frequency   Daily     PT Plan Current plan remains appropriate (recommend 24 hr care for first 3-4 days )       End of Session Equipment Utilized During Treatment: Gait belt   Patient left: in bed;in CPM;with call bell/phone within reach     Time: 1540-1601 PT Time Calculation (min) (ACUTE ONLY): 21 min  Charges:  $Gait Training: 8-22 mins                    G Codes:      RUSSELL,CINDY 04/30/2015, 4:02 PM

## 2015-04-30 NOTE — Care Management (Signed)
Important Message  Patient Details  Name: Doris Riley MRN: [id number] Date of Birth: 01-07-1941   Medicare Important Message Given:  Yes-second notification given    Malcolm MetroChildress, Marca Gadsby Demske, RN 04/30/2015, 12:45 PM

## 2015-04-30 NOTE — Care Management Note (Signed)
Case Management Note  Patient Details  Name: Deetta PerlaMildred M Holzer MRN: [id number] Date of Birth: [birth year]  Expected Discharge Date:  05/01/15               Expected Discharge Plan:  Home w Home Health Services  In-House Referral:  Clinical Social Work  Discharge planning Services  CM Consult  Post Acute Care Choice:  Durable Medical Equipment, Home Health Choice offered to:  Patient  DME Arranged:  3-N-1, Walker rolling DME Agency:  [facility]  HH Arranged:  PT, Nurse's Aide, RN HH Agency:  [facility]  Status of Service:  Completed, signed off  Medicare Important Message Given:    Date Medicare IM Given:    Medicare IM give by:    Date Additional Medicare IM Given:    Additional Medicare Important Message give by:     If discussed at Long Length of Stay Meetings, dates discussed:    Additional Comments: [facility], PT, and SW. [facility] is aware of DC plan and needs. Weekend RN will notify [facility] of DC and fax HH orders. Pt has no further CM needs at this time.  Malcolm Metrohildress, Tenaya Hilyer Demske, RN 04/30/2015, 12:44 PM

## 2015-04-30 NOTE — Progress Notes (Signed)
Postoperative note  Postoperative day number  2  Status post left total knee and hardware removal left knee  Vital signs  BP 124/49 mmHg  Pulse 68  Temp(Src) 98.2 F (36.8 C) (Oral)  Resp 18  Ht 5\' 5"  (1.651 m)  Wt 190 lb (86.183 kg)  BMI 31.62 kg/m2  SpO2 98%   Pertinent labs   CBC Latest Ref Rng 04/30/2015 04/29/2015 04/22/2015  WBC 4.0 - 10.5 K/uL 15.3(H) 8.3 6.6  Hemoglobin 12.0 - 15.0 g/dL 1.6(X9.2(L) 10.5(L) 13.6  Hematocrit 36.0 - 46.0 % 28.0(L) 32.7(L) 41.6  Platelets 150 - 400 K/uL 282 329 406(H)      Patient complaints  no complaints  Physical exam  the dressing was changed, the wound is clean. The drain was removed. The calf is supple and soft. Homans sign is negative. Ambulatory distance 105 feet. Knee flexion 75  Assessment and plan   Doing well continue therapy, released to home tomorrow

## 2015-04-30 NOTE — Progress Notes (Addendum)
Physical Therapy Treatment Patient Details Name: Doris PerlaMildred M Bourbon MRN: [id number] DOB: 1941/05/12 Today's Date: 04/30/2015    History of Present Illness 74 year old female fractured her left tibial plateau several years back she did well up until this January where she started having increasing medial knee pain and pain with activity and activities of daily living. She did start with Aleve and went ibuprofen glucosamine with not good relief. She likes to dance and has had trouble doing that. She does not complain of catching locking but complains of a popping sensation in activity related pain both knees left worse than right left knee medial pain. Presenting today on POD1 p hardware removal and TKA on L .     PT Comments    Pt needs verbal cuing for proper step length and speed;(pt tends to go faster than she should for safety.)  Follow Up Recommendations  Home health PT     Equipment Recommendations  Rolling walker with 5" wheels       Precautions / Restrictions Precautions Precautions: None Restrictions Weight Bearing Restrictions: Yes LLE Weight Bearing: Weight bearing as tolerated    Mobility  Bed Mobility Overal bed mobility: Modified Independent                Transfers   Equipment used: Rolling walker (2 wheeled) Transfers: Stand Pivot Transfers Sit to Stand: Modified independent (Device/Increase time);Supervision            Ambulation/Gait Ambulation/Gait assistance: Supervision Ambulation Distance (Feet): 105 Feet Assistive device: Rolling walker (2 wheeled) Gait Pattern/deviations: Step-to pattern;Trunk flexed   Gait velocity interpretation: Below normal speed for age/gender            Cognition Arousal/Alertness: Awake/alert   Overall Cognitive Status: Within Functional Limits for tasks assessed Area of Impairment: Orientation                    Exercises Total Joint Exercises Ankle Circles/Pumps: Both;10 reps Quad Sets: Both;10  reps Gluteal Sets: Both;10 reps Heel Slides: Left;10 reps Hip ABduction/ADduction: Left;10 reps Straight Leg Raises: Left;10 reps Goniometric ROM: 7-75        Pertinent Vitals/Pain Pain Score: 6  Pain Location: Lt knee Pain Descriptors / Indicators: Aching           PT Goals (current goals can now be found in the care plan section) Progress towards PT goals: Progressing toward goals    Frequency   BID    PT Plan Current plan remains appropriate (highly recommend pt to have someone at home 24 hr for the first 3-4 days )       End of Session Equipment Utilized During Treatment: Gait belt Activity Tolerance: Patient tolerated treatment well;Patient limited by pain;Other (comment) Patient left: in chair;with call bell/phone within reach;with family/visitor present     Time: [id number] PT Time Calculation (min) (ACUTE ONLY): 33 min  Charges:  $Gait Training: 8-22 mins $Therapeutic Exercise: 8-22 mins                    G CodesVirgina Organ:      Cynthia Russell, PT CLT [phone] 04/30/2015, 11:33 AM

## 2015-04-30 NOTE — Progress Notes (Signed)
Patient fluids discontinued as ordered. Patient tolerating PO fluids well.

## 2015-05-01 LAB — CBC
HCT: 28 % — ABNORMAL LOW (ref 36.0–46.0)
Hemoglobin: 9.4 g/dL — ABNORMAL LOW (ref 12.0–15.0)
MCH: 29.2 pg (ref 26.0–34.0)
MCHC: 33.6 g/dL (ref 30.0–36.0)
MCV: 87 fL (ref 78.0–100.0)
PLATELETS: 342 10*3/uL (ref 150–400)
RBC: 3.22 MIL/uL — ABNORMAL LOW (ref 3.87–5.11)
RDW: 12.7 % (ref 11.5–15.5)
WBC: 12.9 10*3/uL — AB (ref 4.0–10.5)

## 2015-05-01 MED ORDER — POLYETHYLENE GLYCOL 3350 17 G PO PACK: 17.0000 g | PACK | Freq: Every day | ORAL | Status: DC

## 2015-05-01 MED ORDER — TIZANIDINE HCL 2 MG PO TABS: 2.0000 mg | ORAL_TABLET | Freq: Three times a day (TID) | ORAL | Status: DC | PRN

## 2015-05-01 MED ORDER — HYDROCODONE-ACETAMINOPHEN 10-325 MG PO TABS: 1.0000 | ORAL_TABLET | ORAL | Status: DC

## 2015-05-01 MED ORDER — ASPIRIN 325 MG PO TBEC: 325.0000 mg | DELAYED_RELEASE_TABLET | Freq: Two times a day (BID) | ORAL | Status: DC

## 2015-05-01 NOTE — Progress Notes (Signed)
Patient with orders to be discharge home. Discharge instructions given, patient verbalized understanding. Prescriptions given. Patient stable. Patient left in private vehicle with family.  

## 2015-05-01 NOTE — Discharge Summary (Signed)
Physician Discharge Summary  Patient ID: Doris Riley MRN: [id number] DOB/AGE: [birth year] 74 y.o.  Admit date: 04/28/2015 Discharge date: 05/01/2015  Admission Diagnoses: left knee osteoarthritis  Discharge Diagnoses: same  Active Problems:   Primary osteoarthritis of left knee   Tibial plateau fracture   Arthritis of knee, degenerative   Discharged Condition: stable  Hospital Course:  July 6 uncomplicated left total knee under spinal and hemostatic with a Depew prosthesis July 7 postop day 1 Foley catheter was removed patient started physical therapy [facility]. CPM started 0-60. July 8 postop day 2 patient rest well with physical therapy July 9 patient afebrile vital signs are stable wound is clean. Patient discharged home       Discharge Exam: Blood pressure 120/65, pulse 58, temperature 98.4 F (36.9 C), temperature source Oral, resp. rate 20, height  (1.651 m), weight 190 lb (86.183 kg), SpO2 98 %.   Disposition: 01-Home or Self Care  Discharge Instructions    Ambulatory referral to Home Health    Complete by:  As directed   Please evaluate Doris Riley for [facility] to [facility].  Disciplines requested: Nursing, Physical Therapy, Occupational Therapy and Home Health Aide  Services to provide: Evaluate  Physician to follow patient's care (the person listed here will be responsible for signing ongoing orders): Referring Provider  Requested Start of Care Date: Tomorrow  I certify that this patient is under my care and that I, or a Nurse Practitioner or Physician's Assistant working with me, had a face-to-face encounter that meets the physician face-to-face requirements with patient on 7.9.16. The encounter with the patient was in whole, or in part for the following medical condition(s) which is the primary reason for home health care (List medical condition). Left tka  Special Instructions:  Does the patient have Medicare or Medicaid?:  Yes  The  encounter with the patient was in whole, or in part, for the following medical condition, which is the primary reason for home health care:  left knee replacement  Reason for Medically Necessary Home Health Services:  Therapy- Investment banker, operational, Patent examiner  My clinical findings support the need for the above services:  Unable to leave home safely without assistance and/or assistive device  I certify that, based on my findings, the following services are medically necessary home health services:   Nursing Physical therapy    Further, I certify that my clinical findings support that this patient is homebound due to:  Unable to leave home safely without assistance     CPM    Complete by:  As directed   Continuous passive motion machine (CPM):      Use the CPM from 0 to 75 for 8 hours per day.      You may increase by 10 per day.  You may break it up into 2 or 3 sessions per day.      Use CPM for 2 weeks or until you are told to stop.     Call MD / Call 911    Complete by:  As directed   If you experience chest pain or shortness of breath, CALL 911 and be transported to the hospital emergency room.  If you develope a fever above 101 F, pus (white drainage) or increased drainage or redness at the wound, or calf pain, call your surgeon's office.     Change dressing    Complete by:  As directed   Change dressing as needed with  sterile 4 x 4 inch gauze dressing and apply TED hose.  You may clean the incision with alcohol prior to redressing.     Constipation Prevention    Complete by:  As directed   Drink plenty of fluids.  Prune juice may be helpful.  You may use a stool softener, such as Colace (over the counter) 100 mg twice a day.  Use MiraLax (over the counter) for constipation as needed.     Diet - low sodium heart healthy    Complete by:  As directed      Do not put a pillow under the knee. Place it under the heel.    Complete by:  As directed      Driving restrictions     Complete by:  As directed   No driving for 3 weeks     Increase activity slowly as tolerated    Complete by:  As directed      TED hose    Complete by:  As directed   Use stockings (TED hose) for 4 weeks on both leg(s).  You may remove them at night for sleeping.            Medication List    STOP taking these medications        acetaminophen 500 MG tablet  Commonly known as:  TYLENOL     acetaminophen-codeine 300-30 MG per tablet  Commonly known as:  TYLENOL #3      TAKE these medications        amLODipine-benazepril 10-40 MG per capsule  Commonly known as:  LOTREL  Take 1 capsule by mouth daily.     aspirin 325 MG EC tablet  Take 1 tablet (325 mg total) by mouth 2 (two) times daily.     atorvastatin 10 MG tablet  Commonly known as:  LIPITOR  Take 10 mg by mouth daily.     Biotin 5000 MCG Tabs  Take 1 tablet by mouth daily.     ezetimibe 10 MG tablet  Commonly known as:  ZETIA  Take 10 mg by mouth daily.     GREEN TEA PO  Take 8 oz by mouth every evening.     HYDROcodone-acetaminophen 10-325 MG per tablet  Commonly known as:  NORCO  Take 1 tablet by mouth every 4 (four) hours.     IRON PO  Take 65 mg by mouth daily.     lisinopril 40 MG tablet  Commonly known as:  PRINIVIL,ZESTRIL  Take 40 mg by mouth daily.     ONE-DAILY MULTI VITAMINS PO  Take 1 tablet by mouth daily.     polyethylene glycol packet  Commonly known as:  MIRALAX / GLYCOLAX  Take 17 g by mouth daily.     tiZANidine 2 MG tablet  Commonly known as:  ZANAFLEX  Take 1 tablet (2 mg total) by mouth every 8 (eight) hours as needed.     VITAMIN B 12 PO  Take 1 tablet by mouth daily.           Follow-up Information    Follow up with [facility].   Contact information:   [address] [place] [address] [phone]       Follow up with Fuller CanadaStanley Chenille Toor, MD.   Specialties:  Orthopedic Surgery, Radiology   Contact information:   [address] [place] [id number] [phone]       Signed: Fuller CanadaStanley Oma Alpert 05/01/2015, 9:29 AM

## 2015-05-02 DIAGNOSIS — I10 Essential (primary) hypertension: Secondary | ICD-10-CM | POA: Diagnosis not present

## 2015-05-02 DIAGNOSIS — Z471 Aftercare following joint replacement surgery: Secondary | ICD-10-CM | POA: Diagnosis not present

## 2015-05-02 DIAGNOSIS — Z96652 Presence of left artificial knee joint: Secondary | ICD-10-CM | POA: Diagnosis not present

## 2015-05-02 LAB — TYPE AND SCREEN
ABO/RH(D): O POS
Antibody Screen: NEGATIVE
UNIT DIVISION: 0
Unit division: 0

## 2015-05-03 DIAGNOSIS — I10 Essential (primary) hypertension: Secondary | ICD-10-CM | POA: Diagnosis not present

## 2015-05-03 DIAGNOSIS — Z471 Aftercare following joint replacement surgery: Secondary | ICD-10-CM | POA: Diagnosis not present

## 2015-05-03 DIAGNOSIS — Z96652 Presence of left artificial knee joint: Secondary | ICD-10-CM | POA: Diagnosis not present

## 2015-05-05 ENCOUNTER — Telehealth: Payer: Self-pay | Admitting: *Deleted

## 2015-05-05 ENCOUNTER — Telehealth: Payer: Self-pay | Admitting: Orthopedic Surgery

## 2015-05-05 ENCOUNTER — Other Ambulatory Visit: Payer: Self-pay | Admitting: *Deleted

## 2015-05-05 DIAGNOSIS — I10 Essential (primary) hypertension: Secondary | ICD-10-CM | POA: Diagnosis not present

## 2015-05-05 DIAGNOSIS — Z96652 Presence of left artificial knee joint: Secondary | ICD-10-CM

## 2015-05-05 DIAGNOSIS — Z471 Aftercare following joint replacement surgery: Secondary | ICD-10-CM | POA: Diagnosis not present

## 2015-05-05 NOTE — Telephone Encounter (Signed)
NEEDS WOUND CARE INSTRUCTIONS AT THIS TIME, [name] CALL [facility] AT [phone] TO ADVISE?

## 2015-05-05 NOTE — Telephone Encounter (Signed)
PER DR HARRISON PATIENT WILL NOT REQUIRE WOUND CARE, DRESSING CHANGE ONLY AND NURSING MAY DISCHARGE, NURSE [name] AWARE

## 2015-05-05 NOTE — Telephone Encounter (Signed)
PATIENT STATES IS INQUIRING IF CPM IS NECESSARY, STATES PT IS TELLING HER THAT HER FLEXIBILITY IS UP TO 75-80 PERCENT  PER DR HARRISON, YES PROCEED WITH CPM MACHINE AND DO NOT STOP UNTIL 120  PERCENT  PATIENT AWARE

## 2015-05-06 DIAGNOSIS — I10 Essential (primary) hypertension: Secondary | ICD-10-CM | POA: Diagnosis not present

## 2015-05-06 DIAGNOSIS — Z471 Aftercare following joint replacement surgery: Secondary | ICD-10-CM | POA: Diagnosis not present

## 2015-05-06 DIAGNOSIS — Z96652 Presence of left artificial knee joint: Secondary | ICD-10-CM | POA: Diagnosis not present

## 2015-05-06 DIAGNOSIS — M1712 Unilateral primary osteoarthritis, left knee: Secondary | ICD-10-CM | POA: Diagnosis not present

## 2015-05-10 ENCOUNTER — Ambulatory Visit (INDEPENDENT_AMBULATORY_CARE_PROVIDER_SITE_OTHER): Payer: Commercial Managed Care - HMO | Admitting: Orthopedic Surgery

## 2015-05-10 ENCOUNTER — Encounter: Payer: Self-pay | Admitting: Orthopedic Surgery

## 2015-05-10 VITALS — BP 155/76 | Ht 65.0 in | Wt 190.0 lb

## 2015-05-10 DIAGNOSIS — Z96652 Presence of left artificial knee joint: Secondary | ICD-10-CM | POA: Diagnosis not present

## 2015-05-10 DIAGNOSIS — Z471 Aftercare following joint replacement surgery: Secondary | ICD-10-CM | POA: Diagnosis not present

## 2015-05-10 DIAGNOSIS — I10 Essential (primary) hypertension: Secondary | ICD-10-CM | POA: Diagnosis not present

## 2015-05-10 DIAGNOSIS — Z4789 Encounter for other orthopedic aftercare: Secondary | ICD-10-CM

## 2015-05-10 MED ORDER — HYDROCODONE-ACETAMINOPHEN 5-325 MG PO TABS: 1.0000 | ORAL_TABLET | ORAL | Status: DC | PRN

## 2015-05-10 NOTE — Progress Notes (Signed)
Patient ID: Doris Riley, female   DOB: 23-Mar-1941, 74 y.o.   MRN: [id number]  Follow up visit  Chief Complaint  Patient presents with  . Follow-up    post op 1 hardware removal + TKA, DOS 04/28/15    BP 155/76 mmHg  Ht 5\' 5"  (1.651 m)  Wt 190 lb (86.183 kg)  BMI 31.62 kg/m2  Encounter Diagnoses  Name Primary?  . Status post total left knee replacement Yes  . Aftercare following surgery of the musculoskeletal system    Postop remove staples. Wound looks clean staples were removed. Patient has a bruise on the right calf but no calf tenderness Homans sign negative  Patient doing well pain well controlled with half of a 10 mg hydrocodone  Everything else looks fine. She should continue her physical therapy and her CPM machine out to 3 weeks, stockings out to 4 weeks  Return in 4 weeks

## 2015-05-12 DIAGNOSIS — Z96652 Presence of left artificial knee joint: Secondary | ICD-10-CM | POA: Diagnosis not present

## 2015-05-12 DIAGNOSIS — I10 Essential (primary) hypertension: Secondary | ICD-10-CM | POA: Diagnosis not present

## 2015-05-12 DIAGNOSIS — Z471 Aftercare following joint replacement surgery: Secondary | ICD-10-CM | POA: Diagnosis not present

## 2015-05-13 DIAGNOSIS — Z471 Aftercare following joint replacement surgery: Secondary | ICD-10-CM | POA: Diagnosis not present

## 2015-05-13 DIAGNOSIS — Z96652 Presence of left artificial knee joint: Secondary | ICD-10-CM | POA: Diagnosis not present

## 2015-05-13 DIAGNOSIS — I10 Essential (primary) hypertension: Secondary | ICD-10-CM | POA: Diagnosis not present

## 2015-05-18 DIAGNOSIS — I10 Essential (primary) hypertension: Secondary | ICD-10-CM | POA: Diagnosis not present

## 2015-05-18 DIAGNOSIS — Z471 Aftercare following joint replacement surgery: Secondary | ICD-10-CM | POA: Diagnosis not present

## 2015-05-18 DIAGNOSIS — Z96652 Presence of left artificial knee joint: Secondary | ICD-10-CM | POA: Diagnosis not present

## 2015-05-19 DIAGNOSIS — Z96652 Presence of left artificial knee joint: Secondary | ICD-10-CM | POA: Diagnosis not present

## 2015-05-19 DIAGNOSIS — I10 Essential (primary) hypertension: Secondary | ICD-10-CM | POA: Diagnosis not present

## 2015-05-19 DIAGNOSIS — Z471 Aftercare following joint replacement surgery: Secondary | ICD-10-CM | POA: Diagnosis not present

## 2015-05-21 DIAGNOSIS — Z96652 Presence of left artificial knee joint: Secondary | ICD-10-CM | POA: Diagnosis not present

## 2015-05-21 DIAGNOSIS — I10 Essential (primary) hypertension: Secondary | ICD-10-CM | POA: Diagnosis not present

## 2015-05-21 DIAGNOSIS — Z471 Aftercare following joint replacement surgery: Secondary | ICD-10-CM | POA: Diagnosis not present

## 2015-05-24 ENCOUNTER — Other Ambulatory Visit: Payer: Self-pay | Admitting: *Deleted

## 2015-05-24 ENCOUNTER — Telehealth: Payer: Self-pay | Admitting: Orthopedic Surgery

## 2015-05-24 DIAGNOSIS — Z96652 Presence of left artificial knee joint: Secondary | ICD-10-CM

## 2015-05-24 NOTE — Telephone Encounter (Signed)
Home Health Nurse called stating that Doris Riley has finished her home health Physical Therapy and the nurse is stating that she will need a new order for outpatient PT, please advise?

## 2015-05-24 NOTE — Telephone Encounter (Signed)
Order faxed to [facility] therapy dept

## 2015-05-24 NOTE — Telephone Encounter (Signed)
Patient aware.

## 2015-05-26 ENCOUNTER — Ambulatory Visit (HOSPITAL_COMMUNITY): Payer: Commercial Managed Care - HMO | Attending: Orthopedic Surgery | Admitting: Physical Therapy

## 2015-05-26 DIAGNOSIS — R29898 Other symptoms and signs involving the musculoskeletal system: Secondary | ICD-10-CM | POA: Diagnosis not present

## 2015-05-26 DIAGNOSIS — R609 Edema, unspecified: Secondary | ICD-10-CM | POA: Insufficient documentation

## 2015-05-26 DIAGNOSIS — R269 Unspecified abnormalities of gait and mobility: Secondary | ICD-10-CM | POA: Diagnosis not present

## 2015-05-26 DIAGNOSIS — M25662 Stiffness of left knee, not elsewhere classified: Secondary | ICD-10-CM | POA: Insufficient documentation

## 2015-05-26 DIAGNOSIS — R2689 Other abnormalities of gait and mobility: Secondary | ICD-10-CM | POA: Diagnosis not present

## 2015-05-26 NOTE — Patient Instructions (Signed)
Strengthening: Quadriceps Set   Tighten muscles on top of thighs by pushing knees down into surface. Hold __3__ seconds. Repeat __10__ times per set. Do _1___ sets per session. Do ___2_ sessions per day.  http://orth.exer.us/602   Copyright  VHI. All rights reserved.  Self-Mobilization: Heel Slide (Supine)   Slide left heel toward buttocks until a gentle stretch is felt. Hold __5__ seconds. Relax. Repeat _10___ times per set. Do _1___ sets per session. Do __2__ sessions per day.  http://orth.exer.us/710   Copyright  VHI. All rights reserved.  Stretching: Hamstring (Supine)   Supporting right thigh behind knee, slowly straighten knee until stretch is felt in back of thigh. Hold __30__ seconds. Repeat ___3_ times per set. Do __1__ sets per session. Do __2__ sessions per day.  http://orth.exer.us/656   Copyright  VHI. All rights reserved.  Bridging   Slowly raise buttocks from floor, keeping stomach tight. Repeat 10____ times per set. Do _1___ sets per session. Do __2__ sessions per day.  http://orth.exer.us/1096   Copyright  VHI. All rights reserved.  Strengthening: Hip Abduction (Side-Lying)   Tighten muscles on front of left thigh, then lift leg _18___ inches from surface, keeping knee locked.  Repeat _10___ times per set. Do __1_ sets per session. Do __2__ sessions per day.  http://orth.exer.us/622   Copyright  VHI. All rights reserved.  Balance: Unilateral   Attempt to balance on left leg, eyes open. Hold _30___ seconds. Repeat ___5_ times per set. Do _1___ sets per session. Do _2___ sessions per day. Perform exercise with eyes closed.  http://orth.exer.us/28   Copyright  VHI. All rights reserved.

## 2015-05-26 NOTE — Therapy (Signed)
[facility] [address] [place], [place], [address] Phone: [phone]   Fax:  [phone]  Physical Therapy Evaluation  Patient Details  Name: Doris Riley MRN: [id number] Date of Birth: 05-Jan-1941 Referring Provider:  Vickki Hearing, MD  Encounter Date: 05/26/2015      PT End of Session - 05/26/15 1554    Visit Number 1   Number of Visits 18   Date for PT Re-Evaluation 06/25/15   Authorization Type [employer] medicare   PT Start Time 1520   PT Stop Time 1600   PT Time Calculation (min) 40 min   Activity Tolerance Patient tolerated treatment well   Behavior During Therapy Anxious      Past Medical History  Diagnosis Date  . Hyperlipidemia   . Hypertension   . Anemia     mild  . Constipation   . Depression   . Arthritis     Past Surgical History  Procedure Laterality Date  . S/p hysterectomy      partial  . Knee surgery      left, fracture  . Colonoscopy      >10 years ago  . Abdominal hysterectomy    . Colonoscopy  09/01/2011    Procedure: COLONOSCOPY;  Surgeon: Arlyce Harman, MD;  Location: [facility] SU[facility];  Service: Endoscopy;  Laterality: N/A;  10:30  . Hardware removal Left 04/28/2015    Procedure: HARDWARE REMOVAL LEFT KNEE;  Surgeon: Vickki Hearing, MD;  Location: [facility] ORS;  Service: Orthopedics;  Laterality: Left;  . Total knee arthroplasty Left 04/28/2015    Procedure:  LEFT TOTAL KNEE ARTHROPLASTY;  Surgeon: Vickki Hearing, MD;  Location: [facility] ORS;  Service: Orthopedics;  Laterality: Left;  depuy implant    There were no vitals filed for this visit.  Visit Diagnosis:  Abnormality of gait  Stiffness of knee joint, left  Edema  Left leg weakness  Poor balance      Subjective Assessment - 05/26/15 1524    Subjective Doris Riley states that she had fx her patella 10 years ago and still had the hardware in her leg.  She was doing quite a bit of line dancing and she noticed increased pain that  progressed for the past two years.  She opted to have a TKR done on 04/28/15.  She had HH and is now being referred to PT.    Pertinent History unremarkable.    Limitations Sitting;Lifting;Standing;Walking;House hold activities   How long can you sit comfortably? 20 minutes    How long can you stand comfortably? 15 minutes    How long can you walk comfortably? walking with a quad cane for about 30 minutes.    Patient Stated Goals walk without a cane , go up and down steps and back to line dancing    Currently in Pain? Yes  worst is at night goes up to an 8/10    Pain Score 3    Pain Orientation Left   Pain Descriptors / Indicators Aching   Aggravating Factors  activity    Pain Relieving Factors ice    Effect of Pain on Daily Activities increase pain             [facility] PT Assessment - 05/26/15 0001    Assessment   Medical Diagnosis Lt TKR   Onset Date/Surgical Date 04/28/15   Prior Therapy HH   Precautions   Precautions None   Restrictions   Weight Bearing Restrictions  No   Balance Screen   Has the patient fallen in the past 6 months No   Has the patient had a decrease in activity level because of a fear of falling?  Yes   Is the patient reluctant to leave their home because of a fear of falling?  No   Home Environment   Living Environment Private residence   Type of Home House   Prior Function   Level of Independence Independent   Vocation Retired   Leisure dancing, Retail banker    Cognition   Overall Cognitive Status Within Functional Limits for tasks assessed   Observation/Other Assessments   Focus on Therapeutic Outcomes (FOTO)  45    Functional Tests   Functional tests Single leg stance   Single Leg Stance   Comments Rt 10 seconds; Lt 1    ROM / Strength   AROM / PROM / Strength AROM;Strength   AROM   AROM Assessment Site Knee   Right/Left Knee Left   Left Knee Extension 5   Left Knee Flexion 93   Strength   Strength Assessment Site Hip;Knee;Ankle    Right/Left Hip Left   Left Hip Flexion 5/5   Left Hip Extension 3-/5   Left Hip ABduction 4/5   Right/Left Knee Left   Left Knee Flexion 5/5   Left Knee Extension 4/5   Right/Left Ankle Left   Left Ankle Dorsiflexion 4/5   Palpation   Patella mobility decreased                [facility] Adult PT Treatment/Exercise - 05/26/15 0001    Exercises   Exercises Knee/Hip   Knee/Hip Exercises: Stretches   Active Hamstring Stretch Left;3 reps;30 seconds   Active Hamstring Stretch Limitations supine   Knee/Hip Exercises: Aerobic   Stationary Bike x8'   Knee/Hip Exercises: Standing   SLS x3   Knee/Hip Exercises: Supine   Quad Sets 10 reps   Heel Slides 10 reps   Bridges 10 reps                PT Education - 05/26/15 1542    Education provided Yes   Education Details HEP   Person(s) Educated Patient   Methods Explanation;Demonstration;Handout   Comprehension Verbalized understanding;Returned demonstration          PT Short Term Goals - 05/26/15 1606    PT SHORT TERM GOAL #1   Title I HEP   Time 3   Period Weeks   PT SHORT TERM GOAL #2   Title PT able to sit for an hour to enjoy eating a meal at a restaurant   Time 3   Period Weeks   PT SHORT TERM GOAL #3   Title Pt to be ambulating inside without an assistive device    Time 3   Period Weeks   PT SHORT TERM GOAL #4   Title Pt ROM to be 3 to 105 to allow increased sitting tolerance and improved gt            PT Long Term Goals - 05/26/15 1608    PT LONG TERM GOAL #1   Title I in advance HEP   Time 6   Period Weeks   PT LONG TERM GOAL #2   Title Pt ROM to be to 115 to allow pt to squat to pick items off the floor   Time 6   Period Weeks   PT LONG TERM GOAL #3   Title Pt to  be walking inside and outside without an assistive device    Time 6   Period Weeks   PT LONG TERM GOAL #4   Title Pt strength to be 4+/5 to allow pt to go up and down steps in a reciprocal mannner.    Time 6   Period Weeks    PT LONG TERM GOAL #5   Title Pt pain level no greater than a 2/10 90 % of the time to allow pt to be sleeping well    Time 6   Period Weeks               Plan - 2015/06/20 1555    Clinical Impression Statement Doris Riley is a 74 yo female who has had a TKR on her LT knee on 04/28/2015.  She is being referred to skilled therapy to maximize her functional ability.  Examination demonstrates increased edema and pain  decreased balance, ROM, strength and functional tolerance as well as gait deviations.  Doris Riley will benefit from skilled physcial therapy to address these issues and improve her quality of life.    Pt will benefit from skilled therapeutic intervention in order to improve on the following deficits Abnormal gait;Cardiopulmonary status limiting activity;Decreased activity tolerance;Decreased balance;Difficulty walking;Decreased strength;Pain;Increased edema;Increased fascial restricitons   Rehab Potential Good   PT Frequency 3x / week   PT Duration 6 weeks   PT Treatment/Interventions ADLs/Self Care Home Management;Gait training;Stair training;Functional mobility training;Therapeutic activities;Therapeutic exercise;Balance training;Neuromuscular re-education;Patient/family education;Passive range of motion;Manual techniques   PT Next Visit Plan begin rocker board, knee flexion; standing and supine terminal extension as well as manual techniques    PT Home Exercise Plan given   Consulted and Agree with Plan of Care Patient          G-Codes - 06-20-15 1612    Functional Limitation Mobility: Walking and moving around   Mobility: Walking and Moving Around Current Status [phone]) At least 40 percent but less than 60 percent impaired, limited or restricted   Mobility: Walking and Moving Around Goal Status [phone]) At least 20 percent but less than 40 percent impaired, limited or restricted       Problem List Patient Active Problem List   Diagnosis Date Noted  . Arthritis of knee,  degenerative 04/28/2015  . Primary osteoarthritis of left knee   . Tibial plateau fracture   . Bloating 10/26/2011  . Constipation 08/07/2011  . Family history of colon cancer 08/07/2011  . Elevated alkaline phosphatase level 08/07/2011  . Anemia 08/07/2011   Virgina Organ, PT CLT [phone] 2015-06-20, 4:14 PM  [facility] [address] [place], [place], [address] Phone: [phone]   Fax:  [phone]

## 2015-05-28 ENCOUNTER — Ambulatory Visit (HOSPITAL_COMMUNITY): Payer: Commercial Managed Care - HMO | Admitting: Physical Therapy

## 2015-05-31 ENCOUNTER — Encounter (HOSPITAL_COMMUNITY): Payer: Commercial Managed Care - HMO | Admitting: Physical Therapy

## 2015-06-02 ENCOUNTER — Ambulatory Visit (HOSPITAL_COMMUNITY): Payer: Commercial Managed Care - HMO | Admitting: Physical Therapy

## 2015-06-02 DIAGNOSIS — R269 Unspecified abnormalities of gait and mobility: Secondary | ICD-10-CM | POA: Diagnosis not present

## 2015-06-02 DIAGNOSIS — R609 Edema, unspecified: Secondary | ICD-10-CM | POA: Diagnosis not present

## 2015-06-02 DIAGNOSIS — R29898 Other symptoms and signs involving the musculoskeletal system: Secondary | ICD-10-CM

## 2015-06-02 DIAGNOSIS — R2689 Other abnormalities of gait and mobility: Secondary | ICD-10-CM

## 2015-06-02 DIAGNOSIS — M25662 Stiffness of left knee, not elsewhere classified: Secondary | ICD-10-CM | POA: Diagnosis not present

## 2015-06-02 NOTE — Therapy (Signed)
[facility] [address] [place], [place], [address] Phone: [phone]   Fax:  [phone]  Physical Therapy Treatment  Patient Details  Name: SUNNIE ODDEN MRN: [id number] Date of Birth: 1941-05-24 Referring Provider:  Kari Baars, MD  Encounter Date: 06/02/2015      PT End of Session - 06/02/15 1142    Visit Number 2   Number of Visits 18   Date for PT Re-Evaluation 06/25/15   Authorization Type [employer] medicare   PT Start Time 1100   PT Stop Time 1149   PT Time Calculation (min) 49 min   Activity Tolerance Patient tolerated treatment well   Behavior During Therapy [facility] for tasks assessed/performed      Past Medical History  Diagnosis Date  . Hyperlipidemia   . Hypertension   . Anemia     mild  . Constipation   . Depression   . Arthritis     Past Surgical History  Procedure Laterality Date  . S/p hysterectomy      partial  . Knee surgery      left, fracture  . Colonoscopy      >10 years ago  . Abdominal hysterectomy    . Colonoscopy  09/01/2011    Procedure: COLONOSCOPY;  Surgeon: Arlyce Harman, MD;  [facility];  Service: Endoscopy;  Laterality: N/A;  10:30  . Hardware removal Left 04/28/2015    Procedure: HARDWARE REMOVAL LEFT KNEE;  Surgeon: Vickki Hearing, MD;  Location: [facility] ORS;  Service: Orthopedics;  Laterality: Left;  . Total knee arthroplasty Left 04/28/2015    Procedure:  LEFT TOTAL KNEE ARTHROPLASTY;  Surgeon: Vickki Hearing, MD;  Location: [facility];  Service: Orthopedics;  Laterality: Left;  depuy implant    There were no vitals filed for this visit.  Visit Diagnosis:  Abnormality of gait  Stiffness of knee joint, left  Left leg weakness  Poor balance      Subjective Assessment - 06/02/15 1104    Subjective Pt reports that she has been walking inside her home and to her mailbox without using her cane and has not had any trouble. She reports that she has a lot of soreness in  her  L knee today, and rates pain as a 6/10.    Currently in Pain? Yes   Pain Score 6                  [facility] Adult PT Treatment/Exercise - 06/02/15 0001    Knee/Hip Exercises: Stretches   Active Hamstring Stretch Left;3 reps;30 seconds   Active Hamstring Stretch Limitations at 12" step   Knee: Self-Stretch to increase Flexion 10 seconds   Knee: Self-Stretch Limitations 10 times   Gastroc Stretch 3 reps;30 seconds   Gastroc Stretch Limitations slantboard   Knee/Hip Exercises: Aerobic   Stationary Bike 8' at seat 10   Knee/Hip Exercises: Standing   Terminal Knee Extension Limitations x 15 with red tband   Rocker Board 2 minutes   Rocker Board Limitations R/L and A/P   Knee/Hip Exercises: Seated   Long Arc Quad 15 reps   Knee/Hip Exercises: Supine   Heel Slides 15 reps   Heel Slides Limitations with overpressure from PT   Manual Therapy   Manual Therapy Soft tissue mobilization   Soft tissue mobilization Scar tissue mobilization to decrease adhesions                PT Education - 06/02/15 1142  Education provided Yes   Education Details goals and POC reviewed   Person(s) Educated Patient   Methods Explanation;Handout   Comprehension Verbalized understanding          PT Short Term Goals - 05/26/15 1606    PT SHORT TERM GOAL #1   Title I HEP   Time 3   Period Weeks   PT SHORT TERM GOAL #2   Title PT able to sit for an hour to enjoy eating a meal at a restaurant   Time 3   Period Weeks   PT SHORT TERM GOAL #3   Title Pt to be ambulating inside without an assistive device    Time 3   Period Weeks   PT SHORT TERM GOAL #4   Title Pt ROM to be 3 to 105 to allow increased sitting tolerance and improved gt            PT Long Term Goals - 05/26/15 1608    PT LONG TERM GOAL #1   Title I in advance HEP   Time 6   Period Weeks   PT LONG TERM GOAL #2   Title Pt ROM to be to 115 to allow pt to squat to pick items off the floor   Time 6   Period  Weeks   PT LONG TERM GOAL #3   Title Pt to be walking inside and outside without an assistive device    Time 6   Period Weeks   PT LONG TERM GOAL #4   Title Pt strength to be 4+/5 to allow pt to go up and down steps in a reciprocal mannner.    Time 6   Period Weeks   PT LONG TERM GOAL #5   Title Pt pain level no greater than a 2/10 90 % of the time to allow pt to be sleeping well    Time 6   Period Weeks               Plan - 06/02/15 1142    Clinical Impression Statement Pt's evaluation, POC, and goals were reviewed with pt. Treatment focused on improving knee flexion ROM, weight bearing, and functional strengthening to improve stability of L knee. Pt demonstrates decreased scar tissue mobility along the middle portion of her scar, education provided on scar tissue mobilization with vitamin E oil.    PT Next Visit Plan Continue with ROM activities, add mini squats and lunges        Problem List Patient Active Problem List   Diagnosis Date Noted  . Arthritis of knee, degenerative 04/28/2015  . Primary osteoarthritis of left knee   . Tibial plateau fracture   . Bloating 10/26/2011  . Constipation 08/07/2011  . Family history of colon cancer 08/07/2011  . Elevated alkaline phosphatase level 08/07/2011  . Anemia 08/07/2011    Leona Singleton, PT, DPT [phone] 06/02/2015, 11:50 AM  [facility] [address] [place], [place], [address] Phone: [phone]   Fax:  [phone]

## 2015-06-03 ENCOUNTER — Ambulatory Visit (HOSPITAL_COMMUNITY): Payer: Commercial Managed Care - HMO

## 2015-06-03 DIAGNOSIS — R29898 Other symptoms and signs involving the musculoskeletal system: Secondary | ICD-10-CM

## 2015-06-03 DIAGNOSIS — R269 Unspecified abnormalities of gait and mobility: Secondary | ICD-10-CM | POA: Diagnosis not present

## 2015-06-03 DIAGNOSIS — R2689 Other abnormalities of gait and mobility: Secondary | ICD-10-CM

## 2015-06-03 DIAGNOSIS — M25662 Stiffness of left knee, not elsewhere classified: Secondary | ICD-10-CM

## 2015-06-03 DIAGNOSIS — R609 Edema, unspecified: Secondary | ICD-10-CM

## 2015-06-03 NOTE — Therapy (Signed)
[facility] [address] [place], [place], [address] Phone: [phone]   Fax:  [phone]  Physical Therapy Treatment  Patient Details  Name: Doris Riley MRN: [id number] Date of Birth: 1941/09/12 Referring Provider:  Vickki Hearing, MD  Encounter Date: 06/03/2015      PT End of Session - 06/03/15 1521    Visit Number 3   Number of Visits 18   Date for PT Re-Evaluation 06/25/15   Authorization Type [employer]   PT Start Time 1518   PT Stop Time 1612   PT Time Calculation (min) 54 min   Activity Tolerance Patient tolerated treatment well   Behavior During Therapy [facility] for tasks assessed/performed      Past Medical History  Diagnosis Date  . Hyperlipidemia   . Hypertension   . Anemia     mild  . Constipation   . Depression   . Arthritis     Past Surgical History  Procedure Laterality Date  . S/p hysterectomy      partial  . Knee surgery      left, fracture  . Colonoscopy      >10 years ago  . Abdominal hysterectomy    . Colonoscopy  09/01/2011    Procedure: COLONOSCOPY;  Surgeon: Arlyce Harman, MD;  [facility];  Service: Endoscopy;  Laterality: N/A;  10:30  . Hardware removal Left 04/28/2015    Procedure: HARDWARE REMOVAL LEFT KNEE;  Surgeon: Vickki Hearing, MD;  Location: [facility] OR[facility];  Service: Orthopedics;  Laterality: Left;  . Total knee arthroplasty Left 04/28/2015    Procedure:  LEFT TOTAL KNEE ARTHROPLASTY;  Surgeon: Vickki Hearing, MD;  Location: [facility];  Service: Orthopedics;  Laterality: Left;  depuy implant    There were no vitals filed for this visit.  Visit Diagnosis:  Abnormality of gait  Stiffness of knee joint, left  Left leg weakness  Poor balance  Edema      Subjective Assessment - 06/03/15 1516    Subjective Pt stated her knee was a sore today, pain scale 4-5/10 today.  Reports compliance with HEP daily.   Currently in Pain? Yes   Pain Score 5    Pain Location  Knee   Pain Orientation Left   Pain Descriptors / Indicators Sore            [facility] PT Assessment - 06/03/15 0001    Assessment   Medical Diagnosis Lt TKR   Onset Date/Surgical Date 04/28/15   Next MD Visit Romeo Apple 06/07/2015   Prior Therapy HH   Precautions   Precautions None            [facility] Adult PT Treatment/Exercise - 06/03/15 0001    Exercises   Exercises Knee/Hip   Knee/Hip Exercises: Stretches   Active Hamstring Stretch Left;3 reps;30 seconds   Active Hamstring Stretch Limitations at 12" step   Knee: Self-Stretch to increase Flexion 10 seconds   Knee: Self-Stretch Limitations 10 times   Gastroc Stretch 3 reps;30 seconds   Gastroc Stretch Limitations slantboard   Knee/Hip Exercises: Aerobic   Stationary Bike 8' at seat 9 c/o irritation Rt knee   Knee/Hip Exercises: Standing   Forward Lunges Both;10 reps   Forward Lunges Limitations 6in step   Terminal Knee Extension 15 reps;Theraband   Theraband Level (Terminal Knee Extension) Level 2 (Red)   Terminal Knee Extension Limitations x 15 with red tband   Functional Squat 15 reps  Functional Squat Limitations minisquats   Rocker Board 2 minutes   Rocker Board Limitations R/L and A/P   SLS Rt 4", Lt 7" max of 5   Knee/Hip Exercises: Supine   Quad Sets 10 reps   Short Arc Quad Sets 10 reps   Heel Slides 15 reps   Heel Slides Limitations 3-104 degrees            PT Education - 06/02/15 1142    Education provided Yes   Education Details goals and POC reviewed   Person(s) Educated Patient   Methods Explanation;Handout   Comprehension Verbalized understanding          PT Short Term Goals - 06/03/15 1603    PT SHORT TERM GOAL #1   Title I HEP   Status On-going   PT SHORT TERM GOAL #2   Title PT able to sit for an hour to enjoy eating a meal at a restaurant   Status On-going   PT SHORT TERM GOAL #3   Title Pt to be ambulating inside without an assistive device    Baseline 06/03/2015:Reports  ambulating with no AD indoors, does continue outside   Status Achieved   PT SHORT TERM GOAL #4   Title Pt ROM to be 3 to 105 to allow increased sitting tolerance and improved gt    Status On-going           PT Long Term Goals - 06/03/15 1605    PT LONG TERM GOAL #1   Title I in advance HEP   PT LONG TERM GOAL #2   Title Pt ROM to be to 115 to allow pt to squat to pick items off the floor   PT LONG TERM GOAL #3   Title Pt to be walking inside and outside without an assistive device    PT LONG TERM GOAL #4   Title Pt strength to be 4+/5 to allow pt to go up and down steps in a reciprocal mannner.    PT LONG TERM GOAL #5   Title Pt pain level no greater than a 2/10 90 % of the time to allow pt to be sleeping well                Plan - 06/03/15 1556    Clinical Impression Statement Session focus on improving AROM for flexion and extension and functional strengthening to improve stability of Lt knee.  Added mini squats and forward lunges for LE strengtheing, pt able to demonstrate good form following cueing and demonstration.  AROM is improving, 3-104 degrees   PT Next Visit Plan Continue with ROM activities, progress functional strengthening as ROM improves.        Problem List Patient Active Problem List   Diagnosis Date Noted  . Arthritis of knee, degenerative 04/28/2015  . Primary osteoarthritis of left knee   . Tibial plateau fracture   . Bloating 10/26/2011  . Constipation 08/07/2011  . Family history of colon cancer 08/07/2011  . Elevated alkaline phosphatase level 08/07/2011  . Anemia 08/07/2011   Becky Sax, LPTA; CBIS [phone]  Juel Burrow 06/03/2015, 4:18 PM  [facility] [address] [place], [place], [address] Phone: [phone]   Fax:  [phone]

## 2015-06-04 ENCOUNTER — Encounter (HOSPITAL_COMMUNITY): Payer: Commercial Managed Care - HMO

## 2015-06-07 ENCOUNTER — Ambulatory Visit (INDEPENDENT_AMBULATORY_CARE_PROVIDER_SITE_OTHER): Payer: Self-pay | Admitting: Orthopedic Surgery

## 2015-06-07 ENCOUNTER — Encounter (HOSPITAL_COMMUNITY): Payer: Commercial Managed Care - HMO | Admitting: Physical Therapy

## 2015-06-07 VITALS — BP 120/58 | Ht 65.0 in | Wt 190.0 lb

## 2015-06-07 DIAGNOSIS — Z4789 Encounter for other orthopedic aftercare: Secondary | ICD-10-CM

## 2015-06-07 DIAGNOSIS — Z96652 Presence of left artificial knee joint: Secondary | ICD-10-CM

## 2015-06-07 MED ORDER — TIZANIDINE HCL 2 MG PO TABS: 2.0000 mg | ORAL_TABLET | Freq: Three times a day (TID) | ORAL | Status: DC | PRN

## 2015-06-07 MED ORDER — HYDROCODONE-ACETAMINOPHEN 5-325 MG PO TABS: 1.0000 | ORAL_TABLET | ORAL | Status: DC | PRN

## 2015-06-07 NOTE — Progress Notes (Signed)
Patient ID: Doris Riley, female   DOB: 05/14/1941, 74 y.o.   MRN: [id number]  Follow up visit  Chief Complaint  Patient presents with  . Follow-up    post op 2, hardware removal + Left TKA, DOS 04/28/15    BP 120/58 mmHg  Ht  (1.651 m)  Wt 190 lb (86.183 kg)  BMI 31.62 kg/m2  Encounter Diagnoses  Name Primary?  . Status post total left knee replacement Yes  . Aftercare following surgery of the musculoskeletal system     Postop visit status post left total knee she is almost 6 weeks out. She had hardware removal and stemmed implant. She's doing well. Her wound looks great. Her knee flexion is just at 90 she has a slight flexion contracture of less than 5  She is walking without support she is driving she smiling  She's doing very well. We will continue therapy and see her in 6 weeks  Meds ordered this encounter  Medications  . tiZANidine (ZANAFLEX) 2 MG tablet    Sig: Take 1 tablet (2 mg total) by mouth every 8 (eight) hours as needed.    Dispense:  60 tablet    Refill:  0  . DISCONTD: HYDROcodone-acetaminophen (NORCO/VICODIN) 5-325 MG per tablet    Sig: Take 1 tablet by mouth every 4 (four) hours as needed for moderate pain.    Dispense:  84 tablet    Refill:  0  . HYDROcodone-acetaminophen (NORCO/VICODIN) 5-325 MG per tablet    Sig: Take 1 tablet by mouth every 4 (four) hours as needed for moderate pain.    Dispense:  84 tablet    Refill:  0

## 2015-06-09 ENCOUNTER — Ambulatory Visit (HOSPITAL_COMMUNITY): Payer: Commercial Managed Care - HMO

## 2015-06-09 DIAGNOSIS — R2689 Other abnormalities of gait and mobility: Secondary | ICD-10-CM | POA: Diagnosis not present

## 2015-06-09 DIAGNOSIS — M25662 Stiffness of left knee, not elsewhere classified: Secondary | ICD-10-CM | POA: Diagnosis not present

## 2015-06-09 DIAGNOSIS — R29898 Other symptoms and signs involving the musculoskeletal system: Secondary | ICD-10-CM | POA: Diagnosis not present

## 2015-06-09 DIAGNOSIS — R269 Unspecified abnormalities of gait and mobility: Secondary | ICD-10-CM | POA: Diagnosis not present

## 2015-06-09 DIAGNOSIS — R609 Edema, unspecified: Secondary | ICD-10-CM

## 2015-06-09 NOTE — Therapy (Signed)
[facility] [address] [place], [place], [address] Phone: [phone]   Fax:  [phone]  Physical Therapy Treatment  Patient Details  Name: Doris Riley MRN: [id number] Date of Birth: 11-12-1940 Referring Provider:  Kari Baars, MD  Encounter Date: 06/09/2015      PT End of Session - 06/09/15 1618    Visit Number 4   Number of Visits 18   Date for PT Re-Evaluation 06/25/15   Authorization Type [employer] medicare   PT Start Time 1606   PT Stop Time 1656   PT Time Calculation (min) 50 min   Activity Tolerance Patient tolerated treatment well   Behavior During Therapy [facility] for tasks assessed/performed      Past Medical History  Diagnosis Date  . Hyperlipidemia   . Hypertension   . Anemia     mild  . Constipation   . Depression   . Arthritis     Past Surgical History  Procedure Laterality Date  . S/p hysterectomy      partial  . Knee surgery      left, fracture  . Colonoscopy      >10 years ago  . Abdominal hysterectomy    . Colonoscopy  09/01/2011    Procedure: COLONOSCOPY;  Surgeon: Arlyce Harman, MD;  [facility];  Service: Endoscopy;  Laterality: N/A;  10:30  . Hardware removal Left 04/28/2015    Procedure: HARDWARE REMOVAL LEFT KNEE;  Surgeon: Vickki Hearing, MD;  Location: [facility];  Service: Orthopedics;  Laterality: Left;  . Total knee arthroplasty Left 04/28/2015    Procedure:  LEFT TOTAL KNEE ARTHROPLASTY;  Surgeon: Vickki Hearing, MD;  Location: [facility];  Service: Orthopedics;  Laterality: Left;  depuy implant    There were no vitals filed for this visit.  Visit Diagnosis:  Abnormality of gait  Stiffness of knee joint, left  Left leg weakness  Poor balance  Edema      Subjective Assessment - 06/09/15 1609    Subjective Pt stated she is pain free today, reported MD happy with progress but does wish for pt to continue therapy.     Currently in Pain? No/denies              [facility] Adult PT Treatment/Exercise - 06/09/15 0001    Exercises   Exercises Knee/Hip   Knee/Hip Exercises: Stretches   Active Hamstring Stretch Left;3 reps;30 seconds   Active Hamstring Stretch Limitations at 12" step   Quad Stretch 3 reps;30 seconds   Quad Stretch Limitations prone with rope   Knee: Self-Stretch to increase Flexion 10 seconds   Knee: Self-Stretch Limitations 10 times   Gastroc Stretch 3 reps;30 seconds   Gastroc Stretch Limitations slantboard   Knee/Hip Exercises: Aerobic   Stationary Bike 8' at seat 9 for ROM, no c/o irritation Rt knee   Knee/Hip Exercises: Standing   Forward Lunges Both;10 reps   Forward Lunges Limitations 6in step   Terminal Knee Extension 15 reps;Theraband   Theraband Level (Terminal Knee Extension) Level 2 (Red)   Functional Squat 15 reps   Functional Squat Limitations minisquats   Rocker Board 2 minutes   Rocker Board Limitations R/L and A/P   Knee/Hip Exercises: Supine   Short Arc Quad Sets 15 reps   Terminal Knee Extension 10 reps   Knee/Hip Exercises: Prone   Other Prone Exercises TKE 10x 5             PT  Short Term Goals - 06/09/15 1619    PT SHORT TERM GOAL #1   Title I HEP   Status On-going   PT SHORT TERM GOAL #2   Title PT able to sit for an hour to enjoy eating a meal at a restaurant   Status On-going   PT SHORT TERM GOAL #3   Title Pt to be ambulating inside without an assistive device    Status Achieved   PT SHORT TERM GOAL #4   Title Pt ROM to be 3 to 105 to allow increased sitting tolerance and improved gt    Status On-going           PT Long Term Goals - 06/03/15 1605    PT LONG TERM GOAL #1   Title I in advance HEP   PT LONG TERM GOAL #2   Title Pt ROM to be to 115 to allow pt to squat to pick items off the floor   PT LONG TERM GOAL #3   Title Pt to be walking inside and outside without an assistive device    PT LONG TERM GOAL #4   Title Pt strength to be 4+/5 to allow pt to go up and down steps in  a reciprocal mannner.    PT LONG TERM GOAL #5   Title Pt pain level no greater than a 2/10 90 % of the time to allow pt to be sleeping well                Plan - 06/09/15 1659    Clinical Impression Statement Session focus on improving AROM both flexion and extension and functional strengthening to improve stability of Lt knee.  TKE exercises complete in standing, supine and prone to improve knee extension and quad strengthening complete on mat.  Pt able to demonstrate good form with all exercises following cueing and demonstration.  Pt did reports slight increased in pain at end of session following bicycle.  Pt encouraged to apply ice for pain and edema control.     PT Next Visit Plan Continue with ROM activities, progress functional strengthening as ROM improves.  Resume manual scar tissue massage PRN.        Problem List Patient Active Problem List   Diagnosis Date Noted  . Arthritis of knee, degenerative 04/28/2015  . Primary osteoarthritis of left knee   . Tibial plateau fracture   . Bloating 10/26/2011  . Constipation 08/07/2011  . Family history of colon cancer 08/07/2011  . Elevated alkaline phosphatase level 08/07/2011  . Anemia 08/07/2011   Becky Sax, LPTA; CBIS [phone]  Juel Burrow 06/09/2015, 5:06 PM  [facility] [address] [place], [place], [address] Phone: [phone]   Fax:  [phone]

## 2015-06-11 ENCOUNTER — Encounter (HOSPITAL_COMMUNITY): Payer: Commercial Managed Care - HMO

## 2015-06-14 ENCOUNTER — Encounter (HOSPITAL_COMMUNITY): Payer: Commercial Managed Care - HMO | Admitting: Physical Therapy

## 2015-06-15 ENCOUNTER — Encounter (HOSPITAL_COMMUNITY): Payer: Commercial Managed Care - HMO | Admitting: Physical Therapy

## 2015-06-16 ENCOUNTER — Encounter (HOSPITAL_COMMUNITY): Payer: Commercial Managed Care - HMO

## 2015-06-17 ENCOUNTER — Ambulatory Visit (HOSPITAL_COMMUNITY): Payer: Commercial Managed Care - HMO | Admitting: Physical Therapy

## 2015-06-17 DIAGNOSIS — R29898 Other symptoms and signs involving the musculoskeletal system: Secondary | ICD-10-CM

## 2015-06-17 DIAGNOSIS — M25662 Stiffness of left knee, not elsewhere classified: Secondary | ICD-10-CM | POA: Diagnosis not present

## 2015-06-17 DIAGNOSIS — R609 Edema, unspecified: Secondary | ICD-10-CM

## 2015-06-17 DIAGNOSIS — R2689 Other abnormalities of gait and mobility: Secondary | ICD-10-CM | POA: Diagnosis not present

## 2015-06-17 DIAGNOSIS — R269 Unspecified abnormalities of gait and mobility: Secondary | ICD-10-CM | POA: Diagnosis not present

## 2015-06-17 NOTE — Therapy (Signed)
[place] [facility] [address] [place], [place], [address] Phone: [phone]   Fax:  [phone]  Physical Therapy Treatment  Patient Details  Name: Doris Riley MRN: [id number] Date of Birth: [birth year] Referring Provider:  Kari Baars, MD  Encounter Date: 06/17/2015      PT End of Session - 06/17/15 1117    Visit Number 5   Number of Visits 18   Date for PT Re-Evaluation 06/25/15   PT Start Time 1025   PT Stop Time 1119   PT Time Calculation (min) 54 min   Activity Tolerance Patient tolerated treatment well      Past Medical History  Diagnosis Date  . Hyperlipidemia   . Hypertension   . Anemia     mild  . Constipation   . Depression   . Arthritis     Past Surgical History  Procedure Laterality Date  . S/p hysterectomy      partial  . Knee surgery      left, fracture  . Colonoscopy      >10 years ago  . Abdominal hysterectomy    . Colonoscopy  09/01/2011    Procedure: COLONOSCOPY;  Surgeon: Arlyce Harman, MD;  [facility];  Service: Endoscopy;  Laterality: N/A;  10:30  . Hardware removal Left 04/28/2015    Procedure: HARDWARE REMOVAL LEFT KNEE;  Surgeon: Vickki Hearing, MD;  Location: [facility];  Service: Orthopedics;  Laterality: Left;  . Total knee arthroplasty Left 04/28/2015    Procedure:  LEFT TOTAL KNEE ARTHROPLASTY;  Surgeon: Vickki Hearing, MD;  Location: [facility];  Service: Orthopedics;  Laterality: Left;  depuy implant    There were no vitals filed for this visit.  Visit Diagnosis:  Abnormality of gait  Stiffness of knee joint, left  Left leg weakness  Poor balance  Edema      Subjective Assessment - 06/17/15 1028    Subjective I'm doing great at home.  Pt was out in the yard for an hour and her leg got a little warm.    Currently in Pain? No/denies            [facility] PT Assessment - 06/17/15 0001    AROM   Left Knee Extension 2   Left Knee Flexion 98               [facility] Adult PT Treatment/Exercise - 06/17/15 1033    Knee/Hip Exercises: Stretches   Active Hamstring Stretch Left;3 reps;30 seconds   Quad Stretch Left;3 reps;10 seconds   Knee: Self-Stretch to increase Flexion Left;3 reps;30 seconds   Knee: Self-Stretch Limitations on second step    Knee/Hip Exercises: Aerobic   Stationary Bike 10'  seat at 9   Knee/Hip Exercises: Standing   Heel Raises Both;15 reps   Knee Flexion Left;10 reps   Knee Flexion Limitations off 6" step    Terminal Knee Extension Strengthening;Left;10 reps   Knee/Hip Exercises: Supine   Quad Sets 10 reps   Heel Slides 10 reps   Terminal Knee Extension 10 reps   Knee/Hip Exercises: Prone   Hip Extension 10 reps   Contract/Relax to Increase Flexion x5   Manual Therapy   Manual Therapy Edema management   Edema Management MLD as well as retro massage and patellar mobility.                PT Education - 06/17/15 1115    Education provided Yes   Education  Details gait pattern heel to toe instead of decreased ankle/knee flexion and hip circumduction   Person(s) Educated Patient   Methods Explanation;Demonstration   Comprehension Verbalized understanding;Returned demonstration          PT Short Term Goals - 06/09/15 1619    PT SHORT TERM GOAL #1   Title I HEP   Status On-going   PT SHORT TERM GOAL #2   Title PT able to sit for an hour to enjoy eating a meal at a restaurant   Status On-going   PT SHORT TERM GOAL #3   Title Pt to be ambulating inside without an assistive device    Status Achieved   PT SHORT TERM GOAL #4   Title Pt ROM to be 3 to 105 to allow increased sitting tolerance and improved gt    Status On-going           PT Long Term Goals - 06/03/15 1605    PT LONG TERM GOAL #1   Title I in advance HEP   PT LONG TERM GOAL #2   Title Pt ROM to be to 115 to allow pt to squat to pick items off the floor   PT LONG TERM GOAL #3   Title Pt to be walking inside and outside without an  assistive device    PT LONG TERM GOAL #4   Title Pt strength to be 4+/5 to allow pt to go up and down steps in a reciprocal mannner.    PT LONG TERM GOAL #5   Title Pt pain level no greater than a 2/10 90 % of the time to allow pt to be sleeping well                Plan - 06/17/15 1118    Clinical Impression Statement Pt improvng in extension but still has swelling and limited flextion.  Pt  exhibits decreased dorsiflexion and knee flexion causing increased hip circumduction with gait.  Pt has decreased number of treatments she is coming due not being able to afford her co-pay.  Suggested that pt work out a payment plan instead of feeling that she has to pay her co-pay every session.    Pt will benefit from skilled therapeutic intervention in order to improve on the following deficits Abnormal gait;Cardiopulmonary status limiting activity;Decreased activity tolerance;Decreased balance;Difficulty walking;Decreased strength;Pain;Increased edema;Increased fascial restricitons   PT Next Visit Plan work on edema management as well as improving flexion and normalizing gait to a heel toe pattern.          Problem List Patient Active Problem List   Diagnosis Date Noted  . Arthritis of knee, degenerative 04/28/2015  . Primary osteoarthritis of left knee   . Tibial plateau fracture   . Bloating 10/26/2011  . Constipation 08/07/2011  . Family history of colon cancer 08/07/2011  . Elevated alkaline phosphatase level 08/07/2011  . Anemia 08/07/2011    Virgina Organ, PT CLT [phone] 06/17/2015, 11:21 AM  [facility] [address] [place], [place], [address] Phone: [phone]   Fax:  [phone]

## 2015-06-18 ENCOUNTER — Encounter (HOSPITAL_COMMUNITY): Payer: Commercial Managed Care - HMO

## 2015-06-21 ENCOUNTER — Encounter (HOSPITAL_COMMUNITY): Payer: Commercial Managed Care - HMO | Admitting: Physical Therapy

## 2015-06-21 ENCOUNTER — Encounter (HOSPITAL_COMMUNITY): Payer: Commercial Managed Care - HMO

## 2015-06-23 ENCOUNTER — Ambulatory Visit (HOSPITAL_COMMUNITY): Payer: Commercial Managed Care - HMO | Admitting: Physical Therapy

## 2015-06-25 ENCOUNTER — Encounter (HOSPITAL_COMMUNITY): Payer: Commercial Managed Care - HMO | Admitting: Physical Therapy

## 2015-06-29 ENCOUNTER — Ambulatory Visit (HOSPITAL_COMMUNITY): Payer: Commercial Managed Care - HMO | Attending: Orthopedic Surgery | Admitting: Physical Therapy

## 2015-06-29 DIAGNOSIS — R269 Unspecified abnormalities of gait and mobility: Secondary | ICD-10-CM | POA: Diagnosis not present

## 2015-06-29 DIAGNOSIS — R609 Edema, unspecified: Secondary | ICD-10-CM | POA: Diagnosis not present

## 2015-06-29 DIAGNOSIS — R2689 Other abnormalities of gait and mobility: Secondary | ICD-10-CM | POA: Insufficient documentation

## 2015-06-29 DIAGNOSIS — R29898 Other symptoms and signs involving the musculoskeletal system: Secondary | ICD-10-CM | POA: Insufficient documentation

## 2015-06-29 DIAGNOSIS — M25662 Stiffness of left knee, not elsewhere classified: Secondary | ICD-10-CM | POA: Diagnosis not present

## 2015-06-29 NOTE — Therapy (Signed)
[facility] [address] [place], [place], [address] Phone: [phone]   Fax:  [phone]  Physical Therapy Treatment  Patient Details  Name: Doris Riley MRN: [id number] Date of Birth: 10-Jan-1941 Referring Provider:  Vickki Hearing, MD  Encounter Date: 06/29/2015      PT End of Session - 06/29/15 1613    Visit Number 6   Number of Visits 18   Date for PT Re-Evaluation 07/27/15   Authorization Type [employer]are   PT Start Time 1100   PT Stop Time 1147   PT Time Calculation (min) 47 min   Activity Tolerance Patient tolerated treatment well   Behavior During Therapy [facility] for tasks assessed/performed      Past Medical History  Diagnosis Date  . Hyperlipidemia   . Hypertension   . Anemia     mild  . Constipation   . Depression   . Arthritis     Past Surgical History  Procedure Laterality Date  . S/p hysterectomy      partial  . Knee surgery      left, fracture  . Colonoscopy      >10 years ago  . Abdominal hysterectomy    . Colonoscopy  09/01/2011    Procedure: COLONOSCOPY;  Surgeon: Arlyce Harman, MD;  [facility];  Service: Endoscopy;  Laterality: N/A;  10:30  . Hardware removal Left 04/28/2015    Procedure: HARDWARE REMOVAL LEFT KNEE;  Surgeon: Vickki Hearing, MD;  Location: [facility];  Service: Orthopedics;  Laterality: Left;  . Total knee arthroplasty Left 04/28/2015    Procedure:  LEFT TOTAL KNEE ARTHROPLASTY;  Surgeon: Vickki Hearing, MD;  Location: [facility];  Service: Orthopedics;  Laterality: Left;  depuy implant    There were no vitals filed for this visit.  Visit Diagnosis:  Abnormality of gait  Stiffness of knee joint, left  Left leg weakness  Poor balance      Subjective Assessment - 06/29/15 1111    Subjective Pt reports that she feels that she is doing pretty well, her LLE has been doing better but her RLE is arthritic and is giving her trouble. She reports compliance with  her HEP daily. She states that she has noticed some improvements in her ROM, walking, balance, and strength.    How long can you sit comfortably? one hour   How long can you stand comfortably? one hour   How long can you walk comfortably? 30 minutes without AD   Currently in Pain? Yes   Pain Score 5             [facility] PT Assessment - 06/29/15 0001    Observation/Other Assessments   Focus on Therapeutic Outcomes (FOTO)  51% limited   AROM   Left Knee Extension 0   Left Knee Flexion 99   Strength   Left Hip Flexion 5/5   Left Hip Extension 3/5   Left Hip ABduction 4+/5   Left Knee Flexion 4+/5   Left Knee Extension 5/5   Ambulation/Gait   Stairs Yes   Stairs Assistance 5: Supervision                     [facility] Adult PT Treatment/Exercise - 06/29/15 0001    Knee/Hip Exercises: Stretches   Active Hamstring Stretch Left;3 reps;30 seconds   Quad Stretch Left;3 reps;10 seconds   Knee: Self-Stretch to increase Flexion 10 seconds   Knee: Self-Stretch Limitations  10 reps   Knee/Hip Exercises: Standing   Terminal Knee Extension Left;15 reps   Theraband Level (Terminal Knee Extension) Level 3 (Green)   Lateral Step Up 10 reps;Step Height: 4"   Forward Step Up 10 reps;Step Height: 4"   Rocker Board 2 minutes   Rocker Board Limitations R/L and A/P   Knee/Hip Exercises: Supine   Heel Slides 15 reps                  PT Short Term Goals - 2015-07-23 1616    PT SHORT TERM GOAL #1   Title I HEP   Time 3   Period Weeks   Status Achieved   PT SHORT TERM GOAL #2   Title PT able to sit for an hour to enjoy eating a meal at a restaurant   Time 3   Period Weeks   Status Achieved   PT SHORT TERM GOAL #3   Title Pt to be ambulating inside without an assistive device    Time 3   Period Weeks   Status Achieved   PT SHORT TERM GOAL #4   Title Pt ROM to be 3 to 105 to allow increased sitting tolerance and improved gt    Time 3   Period Weeks   Status On-going            PT Long Term Goals - 07-23-2015 1617    PT LONG TERM GOAL #1   Title I in advance HEP   Time 6   Period Weeks   Status On-going   PT LONG TERM GOAL #2   Title Pt ROM to be to 115 to allow pt to squat to pick items off the floor   Time 6   Period Weeks   Status On-going   PT LONG TERM GOAL #3   Title Pt to be walking inside and outside without an assistive device    Time 6   Period Weeks   Status Achieved   PT LONG TERM GOAL #4   Title Pt strength to be 4+/5 to allow pt to go up and down steps in a reciprocal mannner.    Time 6   Period Weeks   Status On-going   PT LONG TERM GOAL #5   Title Pt pain level no greater than a 2/10 90 % of the time to allow pt to be sleeping well    Time 6   Period Weeks   Status On-going               Plan - Jul 23, 2015 1614    Clinical Impression Statement Reassessment was completed today. Pt continues to demonstrate decreased knee flexion ROM, achieving only 99 degrees in today's treatment. She also continues to demonstrate weakness in hip extension, hip abduction, and knee flexion, which is hindering her functional mobility and gait mechanics. Pt will benefit from continued physical therapy services to continue to focus on improving ROM of L knee and to address weakness in LLE.    PT Next Visit Plan Continue to focus on flexion ROM, continue with hip/knee functional strengthening          G-Codes - Jul 23, 2015 1617    Functional Assessment Tool Used FOTO   Functional Limitation Mobility: Walking and moving around   Mobility: Walking and Moving Around Current Status (Z6109) At least 40 percent but less than 60 percent impaired, limited or restricted   Mobility: Walking and Moving Around Goal Status (U0454) At least 20 percent  but less than 40 percent impaired, limited or restricted      Problem List Patient Active Problem List   Diagnosis Date Noted  . Arthritis of knee, degenerative 04/28/2015  . Primary osteoarthritis of  left knee   . Tibial plateau fracture   . Bloating 10/26/2011  . Constipation 08/07/2011  . Family history of colon cancer 08/07/2011  . Elevated alkaline phosphatase level 08/07/2011  . Anemia 08/07/2011    Leona Singleton, PT, DPT [phone] 06/29/2015, 4:19 PM  [facility] [address] [place], [place], [address] Phone: [phone]   Fax:  [phone]

## 2015-07-01 ENCOUNTER — Ambulatory Visit (HOSPITAL_COMMUNITY): Payer: Commercial Managed Care - HMO | Admitting: Physical Therapy

## 2015-07-01 DIAGNOSIS — I129 Hypertensive chronic kidney disease with stage 1 through stage 4 chronic kidney disease, or unspecified chronic kidney disease: Secondary | ICD-10-CM | POA: Diagnosis not present

## 2015-07-01 DIAGNOSIS — R2689 Other abnormalities of gait and mobility: Secondary | ICD-10-CM | POA: Diagnosis not present

## 2015-07-01 DIAGNOSIS — M25662 Stiffness of left knee, not elsewhere classified: Secondary | ICD-10-CM

## 2015-07-01 DIAGNOSIS — R609 Edema, unspecified: Secondary | ICD-10-CM | POA: Diagnosis not present

## 2015-07-01 DIAGNOSIS — R29898 Other symptoms and signs involving the musculoskeletal system: Secondary | ICD-10-CM

## 2015-07-01 DIAGNOSIS — I10 Essential (primary) hypertension: Secondary | ICD-10-CM | POA: Diagnosis not present

## 2015-07-01 DIAGNOSIS — R269 Unspecified abnormalities of gait and mobility: Secondary | ICD-10-CM

## 2015-07-01 DIAGNOSIS — Z23 Encounter for immunization: Secondary | ICD-10-CM | POA: Diagnosis not present

## 2015-07-01 DIAGNOSIS — M1990 Unspecified osteoarthritis, unspecified site: Secondary | ICD-10-CM | POA: Diagnosis not present

## 2015-07-01 NOTE — Therapy (Signed)
[place] [facility] [address] [place], [place], [address] Phone: [phone]   Fax:  [phone]  Physical Therapy Treatment  Patient Details  Name: Doris Riley MRN: [id number] Date of Birth: 06-16-1941 Referring Provider:  Vickki Hearing, MD  Encounter Date: 07/01/2015      PT End of Session - 07/01/15 1159    Visit Number 7   Number of Visits 18   Date for PT Re-Evaluation 07/27/15   Authorization Type [employer]   PT Start Time 1100   PT Stop Time 1148   PT Time Calculation (min) 48 min   Activity Tolerance Patient tolerated treatment well   Behavior During Therapy [facility] for tasks assessed/performed      Past Medical History  Diagnosis Date  . Hyperlipidemia   . Hypertension   . Anemia     mild  . Constipation   . Depression   . Arthritis     Past Surgical History  Procedure Laterality Date  . S/p hysterectomy      partial  . Knee surgery      left, fracture  . Colonoscopy      >10 years ago  . Abdominal hysterectomy    . Colonoscopy  09/01/2011    Procedure: COLONOSCOPY;  Surgeon: Arlyce Harman, MD;  [facility];  Service: Endoscopy;  Laterality: N/A;  10:30  . Hardware removal Left 04/28/2015    Procedure: HARDWARE REMOVAL LEFT KNEE;  Surgeon: Vickki Hearing, MD;  Location: [facility];  Service: Orthopedics;  Laterality: Left;  . Total knee arthroplasty Left 04/28/2015    Procedure:  LEFT TOTAL KNEE ARTHROPLASTY;  Surgeon: Vickki Hearing, MD;  Location: [facility];  Service: Orthopedics;  Laterality: Left;  depuy implant    There were no vitals filed for this visit.  Visit Diagnosis:  Abnormality of gait  Stiffness of knee joint, left  Left leg weakness      Subjective Assessment - 07/01/15 1107    Subjective Pt reports that she has some increased pain today. She has been compliant with her HEP, and has the most pain with knee flexion exercises.    Currently in Pain? Yes   Pain Score 3     Pain Location Knee   Pain Orientation Left                         [facility] Adult PT Treatment/Exercise - 07/01/15 0001    Knee/Hip Exercises: Stretches   Active Hamstring Stretch Left;3 reps;30 seconds   Quad Stretch Left;3 reps;10 seconds   Knee: Self-Stretch to increase Flexion 10 seconds   Knee: Self-Stretch Limitations 10 reps   Gastroc Stretch 3 reps;30 seconds   Gastroc Stretch Limitations slantboard   Knee/Hip Exercises: Aerobic   Stationary Bike 8' seat 8   Knee/Hip Exercises: Standing   Lateral Step Up 15 reps;Step Height: 4"   Forward Step Up 15 reps;Step Height: 4"   Rocker Board 2 minutes   Rocker Board Limitations R/L and A/P   Other Standing Knee Exercises sidestepping with RTB x 2 RT   Knee/Hip Exercises: Seated   Sit to Sand 15 reps;without UE support   Knee/Hip Exercises: Supine   Quad Sets 10 reps   Quad Sets Limitations 3 second hold   Short Arc Quad Sets 15 reps   Heel Slides 15 reps  PT Short Term Goals - 06/29/15 1616    PT SHORT TERM GOAL #1   Title I HEP   Time 3   Period Weeks   Status Achieved   PT SHORT TERM GOAL #2   Title PT able to sit for an hour to enjoy eating a meal at a restaurant   Time 3   Period Weeks   Status Achieved   PT SHORT TERM GOAL #3   Title Pt to be ambulating inside without an assistive device    Time 3   Period Weeks   Status Achieved   PT SHORT TERM GOAL #4   Title Pt ROM to be 3 to 105 to allow increased sitting tolerance and improved gt    Time 3   Period Weeks   Status On-going           PT Long Term Goals - 06/29/15 1617    PT LONG TERM GOAL #1   Title I in advance HEP   Time 6   Period Weeks   Status On-going   PT LONG TERM GOAL #2   Title Pt ROM to be to 115 to allow pt to squat to pick items off the floor   Time 6   Period Weeks   Status On-going   PT LONG TERM GOAL #3   Title Pt to be walking inside and outside without an assistive device    Time  6   Period Weeks   Status Achieved   PT LONG TERM GOAL #4   Title Pt strength to be 4+/5 to allow pt to go up and down steps in a reciprocal mannner.    Time 6   Period Weeks   Status On-going   PT LONG TERM GOAL #5   Title Pt pain level no greater than a 2/10 90 % of the time to allow pt to be sleeping well    Time 6   Period Weeks   Status On-going               Plan - 07/01/15 1159    Clinical Impression Statement Continued with functional strengthening and ROM activities to improve flexion today. Quad sets and heel slides were reintroduced today due to pt reported noncompliance with these exercises on HEP. Pt was able to complete forward and lateral step ups without c/o increased pain, but required verbal cueing for proper form.    PT Next Visit Plan Continue to focus on flexion ROM, continue with hip/knee functional strengthening        Problem List Patient Active Problem List   Diagnosis Date Noted  . Arthritis of knee, degenerative 04/28/2015  . Primary osteoarthritis of left knee   . Tibial plateau fracture   . Bloating 10/26/2011  . Constipation 08/07/2011  . Family history of colon cancer 08/07/2011  . Elevated alkaline phosphatase level 08/07/2011  . Anemia 08/07/2011    Leona Singleton, PT, DPT [phone] 07/01/2015, 12:06 PM  [facility] [address] [place], [place], [address] Phone: [phone]   Fax:  [phone]

## 2015-07-06 ENCOUNTER — Ambulatory Visit (HOSPITAL_COMMUNITY): Payer: Commercial Managed Care - HMO | Admitting: Physical Therapy

## 2015-07-06 DIAGNOSIS — R269 Unspecified abnormalities of gait and mobility: Secondary | ICD-10-CM | POA: Diagnosis not present

## 2015-07-06 DIAGNOSIS — R29898 Other symptoms and signs involving the musculoskeletal system: Secondary | ICD-10-CM

## 2015-07-06 DIAGNOSIS — M25662 Stiffness of left knee, not elsewhere classified: Secondary | ICD-10-CM | POA: Diagnosis not present

## 2015-07-06 DIAGNOSIS — R609 Edema, unspecified: Secondary | ICD-10-CM | POA: Diagnosis not present

## 2015-07-06 DIAGNOSIS — R2689 Other abnormalities of gait and mobility: Secondary | ICD-10-CM | POA: Diagnosis not present

## 2015-07-06 NOTE — Therapy (Signed)
[place] [facility] [address] [place], [place], [address] Phone: [phone]   Fax:  [phone]  Physical Therapy Treatment  Patient Details  Name: Doris Riley MRN: [id number] Date of Birth: 08-Jun-1941 Referring Provider:  Vickki Hearing, MD  Encounter Date: 07/06/2015      PT End of Session - 07/06/15 1533    Visit Number 8   Number of Visits 18   Date for PT Re-Evaluation 07/27/15   Authorization Type [employer]   PT Start Time 1100   PT Stop Time 1151   PT Time Calculation (min) 51 min   Activity Tolerance Patient tolerated treatment well   Behavior During Therapy [facility] for tasks assessed/performed      Past Medical History  Diagnosis Date  . Hyperlipidemia   . Hypertension   . Anemia     mild  . Constipation   . Depression   . Arthritis     Past Surgical History  Procedure Laterality Date  . S/p hysterectomy      partial  . Knee surgery      left, fracture  . Colonoscopy      >10 years ago  . Abdominal hysterectomy    . Colonoscopy  09/01/2011    Procedure: COLONOSCOPY;  Surgeon: Arlyce Harman, MD;  [facility];  Service: Endoscopy;  Laterality: N/A;  10:30  . Hardware removal Left 04/28/2015    Procedure: HARDWARE REMOVAL LEFT KNEE;  Surgeon: Vickki Hearing, MD;  Location: [facility];  Service: Orthopedics;  Laterality: Left;  . Total knee arthroplasty Left 04/28/2015    Procedure:  LEFT TOTAL KNEE ARTHROPLASTY;  Surgeon: Vickki Hearing, MD;  Location: [facility];  Service: Orthopedics;  Laterality: Left;  depuy implant    There were no vitals filed for this visit.  Visit Diagnosis:  Abnormality of gait  Stiffness of knee joint, left  Left leg weakness      Subjective Assessment - 07/06/15 1104    Subjective Pt reports that she is having some slight pain in her LLE, but it is not too bad today.    Currently in Pain? Yes   Pain Score 4                          [facility]  Adult PT Treatment/Exercise - 07/06/15 0001    Knee/Hip Exercises: Stretches   Active Hamstring Stretch Left;3 reps;30 seconds   Active Hamstring Stretch Limitations 14" step   Quad Stretch 3 reps;30 seconds   Quad Stretch Limitations prone with rope   Knee: Self-Stretch to increase Flexion 10 seconds   Knee: Self-Stretch Limitations 10 reps   Gastroc Stretch 3 reps;30 seconds   Gastroc Stretch Limitations slantboard   Knee/Hip Exercises: Aerobic   Stationary Bike 10' seat 9   Knee/Hip Exercises: Standing   Heel Raises Both;15 reps   Forward Lunges 15 reps   Forward Lunges Limitations 6in step   Side Lunges 15 reps   Side Lunges Limitations 6 inch step   Lateral Step Up 15 reps;Step Height: 6"   Forward Step Up 15 reps;Step Height: 6"   Rocker Board 2 minutes   Rocker Board Limitations no UE, R/L and A/P   Other Standing Knee Exercises sidestepping with RTB x 2 RT   Knee/Hip Exercises: Prone   Other Prone Exercises PROM into knee flexion x 10  PT Short Term Goals - 06/29/15 1616    PT SHORT TERM GOAL #1   Title I HEP   Time 3   Period Weeks   Status Achieved   PT SHORT TERM GOAL #2   Title PT able to sit for an hour to enjoy eating a meal at a restaurant   Time 3   Period Weeks   Status Achieved   PT SHORT TERM GOAL #3   Title Pt to be ambulating inside without an assistive device    Time 3   Period Weeks   Status Achieved   PT SHORT TERM GOAL #4   Title Pt ROM to be 3 to 105 to allow increased sitting tolerance and improved gt    Time 3   Period Weeks   Status On-going           PT Long Term Goals - 06/29/15 1617    PT LONG TERM GOAL #1   Title I in advance HEP   Time 6   Period Weeks   Status On-going   PT LONG TERM GOAL #2   Title Pt ROM to be to 115 to allow pt to squat to pick items off the floor   Time 6   Period Weeks   Status On-going   PT LONG TERM GOAL #3   Title Pt to be walking inside and outside without an  assistive device    Time 6   Period Weeks   Status Achieved   PT LONG TERM GOAL #4   Title Pt strength to be 4+/5 to allow pt to go up and down steps in a reciprocal mannner.    Time 6   Period Weeks   Status On-going   PT LONG TERM GOAL #5   Title Pt pain level no greater than a 2/10 90 % of the time to allow pt to be sleeping well    Time 6   Period Weeks   Status On-going               Plan - 07/06/15 1533    Clinical Impression Statement Treatment session continued focus on improving knee strength and ROM. Pt's biggest limitation continues to be her decreased knee flexion ROM, which was addressed today with knee flexion stretch at step, prone quad stretch/ROM with rope, and manual PROM to improve flexion.    PT Next Visit Plan Continue with flexion ROM, manual therapy to improve flexion, continue with functional strengthening        Problem List Patient Active Problem List   Diagnosis Date Noted  . Arthritis of knee, degenerative 04/28/2015  . Primary osteoarthritis of left knee   . Tibial plateau fracture   . Bloating 10/26/2011  . Constipation 08/07/2011  . Family history of colon cancer 08/07/2011  . Elevated alkaline phosphatase level 08/07/2011  . Anemia 08/07/2011    Leona Singleton, PT, DPT [phone] 07/06/2015, 3:42 PM  [facility] [address] [place], [place], [address] Phone: [phone]   Fax:  [phone]

## 2015-07-08 ENCOUNTER — Ambulatory Visit (HOSPITAL_COMMUNITY): Payer: Commercial Managed Care - HMO | Admitting: Physical Therapy

## 2015-07-08 DIAGNOSIS — R29898 Other symptoms and signs involving the musculoskeletal system: Secondary | ICD-10-CM

## 2015-07-08 DIAGNOSIS — M25662 Stiffness of left knee, not elsewhere classified: Secondary | ICD-10-CM | POA: Diagnosis not present

## 2015-07-08 DIAGNOSIS — R269 Unspecified abnormalities of gait and mobility: Secondary | ICD-10-CM | POA: Diagnosis not present

## 2015-07-08 DIAGNOSIS — R2689 Other abnormalities of gait and mobility: Secondary | ICD-10-CM

## 2015-07-08 DIAGNOSIS — R609 Edema, unspecified: Secondary | ICD-10-CM | POA: Diagnosis not present

## 2015-07-08 NOTE — Therapy (Signed)
[facility] [facility] [address] [place], [place], [address] Phone: [phone]   Fax:  [phone]  Physical Therapy Treatment  Patient Details  Name: Doris Riley MRN: [id number] Date of Birth: [birth year] Referring Provider:  Vickki Hearing, MD  Encounter Date: 07/08/2015      PT End of Session - 07/08/15 1203    Visit Number 9   Number of Visits 18   Date for PT Re-Evaluation 07/27/15   Authorization Type [employer]   PT Start Time 1100   PT Stop Time 1141   PT Time Calculation (min) 41 min   Activity Tolerance Patient tolerated treatment well   Behavior During Therapy [facility] for tasks assessed/performed      Past Medical History  Diagnosis Date  . Hyperlipidemia   . Hypertension   . Anemia     mild  . Constipation   . Depression   . Arthritis     Past Surgical History  Procedure Laterality Date  . S/p hysterectomy      partial  . Knee surgery      left, fracture  . Colonoscopy      >10 years ago  . Abdominal hysterectomy    . Colonoscopy  09/01/2011    Procedure: COLONOSCOPY;  Surgeon: Arlyce Harman, MD;  [facility];  Service: Endoscopy;  Laterality: N/A;  10:30  . Hardware removal Left 04/28/2015    Procedure: HARDWARE REMOVAL LEFT KNEE;  Surgeon: Vickki Hearing, MD;  Location: [facility];  Service: Orthopedics;  Laterality: Left;  . Total knee arthroplasty Left 04/28/2015    Procedure:  LEFT TOTAL KNEE ARTHROPLASTY;  Surgeon: Vickki Hearing, MD;  Location: [facility];  Service: Orthopedics;  Laterality: Left;  depuy implant    There were no vitals filed for this visit.  Visit Diagnosis:  Abnormality of gait  Stiffness of knee joint, left  Left leg weakness  Poor balance      Subjective Assessment - 07/08/15 1105    Subjective Pt reports that she has some pain in her L knee today, rates it as a 4/10.    Currently in Pain? Yes   Pain Score 4    Pain Location Knee   Pain Orientation Left             [facility] PT Assessment - 07/08/15 0001    AROM   Left Knee Flexion 103                     [facility] Adult PT Treatment/Exercise - 07/08/15 0001    Knee/Hip Exercises: Stretches   Active Hamstring Stretch Left;3 reps;30 seconds   Active Hamstring Stretch Limitations 14" step   Quad Stretch 3 reps;30 seconds   Quad Stretch Limitations prone with rope   Knee: Self-Stretch to increase Flexion 10 seconds   Knee: Self-Stretch Limitations 10 reps   Gastroc Stretch 3 reps;30 seconds   Gastroc Stretch Limitations slantboard   Knee/Hip Exercises: Standing   Heel Raises Left;15 reps   Heel Raises Limitations unilateral   Forward Lunges 15 reps   Forward Lunges Limitations 4 inch step   Side Lunges 15 reps   Side Lunges Limitations 4 inch step   Theraband Level (Terminal Knee Extension) Level 3 (Green)   Lateral Step Up 10 reps;Step Height: 6"   Lateral Step Up Limitations no UE   Forward Step Up 10 reps;Step Height: 6"   Forward Step Up Limitations no UE  Functional Squat 15 reps   Functional Squat Limitations at mat table   Wall Squat 15 reps   Rocker Board 2 minutes   Rocker Board Limitations no UE, R/L    Knee/Hip Exercises: Supine   Quad Sets 10 reps   Quad Sets Limitations 3 second hold   Heel Slides 15 reps  + 5 reps with PT overpressure   Heel Slides Limitations 103 degrees achieved                  PT Short Term Goals - 06/29/15 1616    PT SHORT TERM GOAL #1   Title I HEP   Time 3   Period Weeks   Status Achieved   PT SHORT TERM GOAL #2   Title PT able to sit for an hour to enjoy eating a meal at a restaurant   Time 3   Period Weeks   Status Achieved   PT SHORT TERM GOAL #3   Title Pt to be ambulating inside without an assistive device    Time 3   Period Weeks   Status Achieved   PT SHORT TERM GOAL #4   Title Pt ROM to be 3 to 105 to allow increased sitting tolerance and improved gt    Time 3   Period Weeks   Status  On-going           PT Long Term Goals - 06/29/15 1617    PT LONG TERM GOAL #1   Title I in advance HEP   Time 6   Period Weeks   Status On-going   PT LONG TERM GOAL #2   Title Pt ROM to be to 115 to allow pt to squat to pick items off the floor   Time 6   Period Weeks   Status On-going   PT LONG TERM GOAL #3   Title Pt to be walking inside and outside without an assistive device    Time 6   Period Weeks   Status Achieved   PT LONG TERM GOAL #4   Title Pt strength to be 4+/5 to allow pt to go up and down steps in a reciprocal mannner.    Time 6   Period Weeks   Status On-going   PT LONG TERM GOAL #5   Title Pt pain level no greater than a 2/10 90 % of the time to allow pt to be sleeping well    Time 6   Period Weeks   Status On-going               Plan - 07/08/15 1209    Clinical Impression Statement Continued ROM activities to improve knee flexion. Pt was able to achieve 103 degrees with PROM from PT. Terminal knee extension and wall squats were added to today's treatment to improve quad strength. Pt denied any pain post treatment.    PT Next Visit Plan Continue with flexion ROM, manual therapy to improve flexion, continue with functional strengthening        Problem List Patient Active Problem List   Diagnosis Date Noted  . Arthritis of knee, degenerative 04/28/2015  . Primary osteoarthritis of left knee   . Tibial plateau fracture   . Bloating 10/26/2011  . Constipation 08/07/2011  . Family history of colon cancer 08/07/2011  . Elevated alkaline phosphatase level 08/07/2011  . Anemia 08/07/2011    Leona Singleton, PT, DPT [phone] 07/08/2015, 12:14 PM  [facility] [address] [place], [place], [address] Phone: [phone]   Fax:  [phone]

## 2015-07-13 ENCOUNTER — Ambulatory Visit (HOSPITAL_COMMUNITY): Payer: Commercial Managed Care - HMO | Admitting: Physical Therapy

## 2015-07-13 DIAGNOSIS — R29898 Other symptoms and signs involving the musculoskeletal system: Secondary | ICD-10-CM

## 2015-07-13 DIAGNOSIS — R2689 Other abnormalities of gait and mobility: Secondary | ICD-10-CM | POA: Diagnosis not present

## 2015-07-13 DIAGNOSIS — R269 Unspecified abnormalities of gait and mobility: Secondary | ICD-10-CM

## 2015-07-13 DIAGNOSIS — R609 Edema, unspecified: Secondary | ICD-10-CM

## 2015-07-13 DIAGNOSIS — M25662 Stiffness of left knee, not elsewhere classified: Secondary | ICD-10-CM

## 2015-07-13 NOTE — Therapy (Signed)
[facility] [address] [place], [place], [address] Phone: [phone]   Fax:  [phone]  Physical Therapy Treatment  Patient Details  Name: Doris Riley MRN: [id number] Date of Birth: 03/22/1941 Referring Provider:  Vickki Hearing, MD  Encounter Date: 07/13/2015      PT End of Session - 07/13/15 1117    Visit Number 10   Number of Visits 18   Date for PT Re-Evaluation 07/27/15   Authorization Type [employer] medicare (gcode was updated on visit 6)   Authorization - Visit Number 10   Authorization - Number of Visits 16   PT Start Time 1108   PT Stop Time 1146   PT Time Calculation (min) 38 min   Activity Tolerance Patient tolerated treatment well   Behavior During Therapy [facility] for tasks assessed/performed      Past Medical History  Diagnosis Date  . Hyperlipidemia   . Hypertension   . Anemia     mild  . Constipation   . Depression   . Arthritis     Past Surgical History  Procedure Laterality Date  . S/p hysterectomy      partial  . Knee surgery      left, fracture  . Colonoscopy      >10 years ago  . Abdominal hysterectomy    . Colonoscopy  09/01/2011    Procedure: COLONOSCOPY;  Surgeon: Arlyce Harman, MD;  [facility]: [facility];  Service: Endoscopy;  Laterality: N/A;  10:30  . Hardware removal Left 04/28/2015    Procedure: HARDWARE REMOVAL LEFT KNEE;  Surgeon: Vickki Hearing, MD;  Location: [facility] ORS;  Service: Orthopedics;  Laterality: Left;  . Total knee arthroplasty Left 04/28/2015    Procedure:  LEFT TOTAL KNEE ARTHROPLASTY;  Surgeon: Vickki Hearing, MD;  Location: [facility] OR[facility];  Service: Orthopedics;  Laterality: Left;  depuy implant    There were no vitals filed for this visit.  Visit Diagnosis:  Abnormality of gait  Stiffness of knee joint, left  Left leg weakness  Poor balance  Edema      Subjective Assessment - 07/13/15 1110    Subjective Pt states she is not hurting.  STates she has a  funeral to attend at noon so will not be able to complete full session    Currently in Pain? No/denies                         [facility] Adult PT Treatment/Exercise - 07/13/15 1113    Knee/Hip Exercises: Stretches   Active Hamstring Stretch Left;3 reps;30 seconds   Active Hamstring Stretch Limitations 14" step   Knee: Self-Stretch to increase Flexion Left;10 seconds   Knee: Self-Stretch Limitations 10 reps onto 14" box   Gastroc Stretch 3 reps;30 seconds   Gastroc Stretch Limitations slantboard   Knee/Hip Exercises: Standing   Heel Raises Left;15 reps   Heel Raises Limitations both without UE 15 reps   Forward Lunges 15 reps   Forward Lunges Limitations 4 inch step   Side Lunges 15 reps   Side Lunges Limitations 4 inch step   Lateral Step Up 10 reps;Step Height: 6"   Lateral Step Up Limitations no UE   Forward Step Up 10 reps;Step Height: 6"   Forward Step Up Limitations no UE   Functional Squat 15 reps   Rocker Board 2 minutes   Rocker Board Limitations no UE, R/L    Other Standing Knee  Exercises sidestepping with GTB x 2 RT   Other Standing Knee Exercises tandem, retro 1RT each   Knee/Hip Exercises: Supine   Heel Slides 15 reps   Heel Slides Limitations 105 AAROM/ 108 PROM                  PT Short Term Goals - 06/29/15 1616    PT SHORT TERM GOAL #1   Title I HEP   Time 3   Period Weeks   Status Achieved   PT SHORT TERM GOAL #2   Title PT able to sit for an hour to enjoy eating a meal at a restaurant   Time 3   Period Weeks   Status Achieved   PT SHORT TERM GOAL #3   Title Pt to be ambulating inside without an assistive device    Time 3   Period Weeks   Status Achieved   PT SHORT TERM GOAL #4   Title Pt ROM to be 3 to 105 to allow increased sitting tolerance and improved gt    Time 3   Period Weeks   Status On-going           PT Long Term Goals - 06/29/15 1617    PT LONG TERM GOAL #1   Title I in advance HEP   Time 6   Period  Weeks   Status On-going   PT LONG TERM GOAL #2   Title Pt ROM to be to 115 to allow pt to squat to pick items off the floor   Time 6   Period Weeks   Status On-going   PT LONG TERM GOAL #3   Title Pt to be walking inside and outside without an assistive device    Time 6   Period Weeks   Status Achieved   PT LONG TERM GOAL #4   Title Pt strength to be 4+/5 to allow pt to go up and down steps in a reciprocal mannner.    Time 6   Period Weeks   Status On-going   PT LONG TERM GOAL #5   Title Pt pain level no greater than a 2/10 90 % of the time to allow pt to be sleeping well    Time 6   Period Weeks   Status On-going               Plan - 07/13/15 1149    Clinical Impression Statement Able to progress Lt knee flexion to 105 degrees AAROM and 108 degrees PROM.  Pt with significant hypertrophic scar tissue; encouraged patient to continue scar massage to help decrease adhesions and increase pliability of surrounding skin.  Pt unable to complete full session as she had to leave to get to a funeral.  Progressed dynamic balance actvity and increased to green theraband with side stepping today without LOB.    PT Next Visit Plan Continue with flexion ROM, manual therapy to improve flexion, continue with functional strengthening        Problem List Patient Active Problem List   Diagnosis Date Noted  . Arthritis of knee, degenerative 04/28/2015  . Primary osteoarthritis of left knee   . Tibial plateau fracture   . Bloating 10/26/2011  . Constipation 08/07/2011  . Family history of colon cancer 08/07/2011  . Elevated alkaline phosphatase level 08/07/2011  . Anemia 08/07/2011    Lurena Nida, PTA/CLT [phone] 07/13/2015, 11:52 AM  [facility] [address] [place], [place],  [place] Phone: [phone]   Fax:  [phone]

## 2015-07-15 ENCOUNTER — Ambulatory Visit (HOSPITAL_COMMUNITY): Payer: Commercial Managed Care - HMO | Admitting: Physical Therapy

## 2015-07-15 DIAGNOSIS — R29898 Other symptoms and signs involving the musculoskeletal system: Secondary | ICD-10-CM

## 2015-07-15 DIAGNOSIS — M25662 Stiffness of left knee, not elsewhere classified: Secondary | ICD-10-CM | POA: Diagnosis not present

## 2015-07-15 DIAGNOSIS — R609 Edema, unspecified: Secondary | ICD-10-CM | POA: Diagnosis not present

## 2015-07-15 DIAGNOSIS — R269 Unspecified abnormalities of gait and mobility: Secondary | ICD-10-CM | POA: Diagnosis not present

## 2015-07-15 DIAGNOSIS — R2689 Other abnormalities of gait and mobility: Secondary | ICD-10-CM | POA: Diagnosis not present

## 2015-07-15 NOTE — Therapy (Signed)
[place] [address] [place], [place], [address] Phone: [phone]   Fax:  [phone]  Physical Therapy Treatment  Patient Details  Name: Doris Riley MRN: [id number] Date of Birth: 07-Nov-1940 Referring Provider:  Sinda Du, MD  Encounter Date: 07/15/2015      PT End of Session - 07/15/15 1637    Visit Number 11   Number of Visits 18   Authorization Type [employer] medicare (gcode was updated on visit 6)   Authorization - Visit Number 11   Authorization - Number of Visits 16   PT Start Time 1100   PT Stop Time 1140   PT Time Calculation (min) 40 min   Activity Tolerance Patient tolerated treatment well   Behavior During Therapy [facility] for tasks assessed/performed      Past Medical History  Diagnosis Date  . Hyperlipidemia   . Hypertension   . Anemia     mild  . Constipation   . Depression   . Arthritis     Past Surgical History  Procedure Laterality Date  . S/p hysterectomy      partial  . Knee surgery      left, fracture  . Colonoscopy      >10 years ago  . Abdominal hysterectomy    . Colonoscopy  09/01/2011    Procedure: COLONOSCOPY;  Surgeon: Dorothyann Peng, MD;  [facility];  Service: Endoscopy;  Laterality: N/A;  10:30  . Hardware removal Left 04/28/2015    Procedure: HARDWARE REMOVAL LEFT KNEE;  Surgeon: Carole Civil, MD;  Location: [facility] ORS;  Service: Orthopedics;  Laterality: Left;  . Total knee arthroplasty Left 04/28/2015    Procedure:  LEFT TOTAL KNEE ARTHROPLASTY;  Surgeon: Carole Civil, MD;  Location: [facility];  Service: Orthopedics;  Laterality: Left;  depuy implant    There were no vitals filed for this visit.  Visit Diagnosis:  Abnormality of gait  Stiffness of knee joint, left  Left leg weakness  Poor balance      Subjective Assessment - 07/15/15 1104    Subjective Pt reports that she is not having any pain today. She states that she wishes to be discharged at this  time because she is happy with her progress. She has been walking with less pain on her LLE, and is able to move her knee more easily. She reports that she still feels that her balance is limited, and she is unable to maintain SLS. She reports at this time that her R knee is giving her more trouble than her L knee.    How long can you sit comfortably? 2 hours   How long can you stand comfortably? 30-60 minutes   How long can you walk comfortably? 30 minutes without AD   Currently in Pain? No/denies   Pain Score 0-No pain            [facility] PT Assessment - 07/15/15 0001    Observation/Other Assessments   Focus on Therapeutic Outcomes (FOTO)  44% limited   AROM   Left Knee Extension 0   Left Knee Flexion 103   Strength   Left Hip Flexion 5/5   Left Hip Extension 3+/5   Left Hip ABduction 4+/5   Left Knee Flexion 4+/5   Left Knee Extension 5/5                     [facility] Adult PT Treatment/Exercise - 07/15/15 0001  Knee/Hip Exercises: Stretches   Active Hamstring Stretch Left;3 reps;30 seconds   Active Hamstring Stretch Limitations 14" step   Knee: Self-Stretch to increase Flexion Left;10 seconds   Knee: Self-Stretch Limitations 10 reps onto 14" box   Gastroc Stretch 3 reps;30 seconds   Gastroc Stretch Limitations slantboard   Knee/Hip Exercises: Standing   Lateral Step Up 10 reps;Step Height: 6"   Lateral Step Up Limitations no UE   Forward Step Up 10 reps;Step Height: 6"   Forward Step Up Limitations no UE   SLS 10" max on LLE   Other Standing Knee Exercises sidestepping with GTB x 2 RT   Other Standing Knee Exercises tandem, retro 1RT each                PT Education - 07/15/15 1637    Education provided Yes   Education Details HEP reviewed, goals discussed   Person(s) Educated Patient   Methods Explanation   Comprehension Verbalized understanding          PT Short Term Goals - 07/15/15 1639    PT SHORT TERM GOAL #1   Title I HEP   Time 3    Period Weeks   Status Achieved   PT SHORT TERM GOAL #2   Title PT able to sit for an hour to enjoy eating a meal at a restaurant   Time 3   Period Weeks   Status Achieved   PT SHORT TERM GOAL #3   Title Pt to be ambulating inside without an assistive device    Time 3   Period Weeks   Status Partially Met   PT SHORT TERM GOAL #4   Title Pt ROM to be 3 to 105 to allow increased sitting tolerance and improved gt    Time 3   Period Weeks   Status Not Met           PT Long Term Goals - 07/15/15 1640    PT LONG TERM GOAL #1   Title I in advance HEP   Time 6   Period Weeks   Status Achieved   PT LONG TERM GOAL #2   Title Pt ROM to be to 115 to allow pt to squat to pick items off the floor   Time 6   Period Weeks   Status Not Met   PT LONG TERM GOAL #3   Title Pt to be walking inside and outside without an assistive device    Time 6   Period Weeks   Status Achieved   PT LONG TERM GOAL #4   Title Pt strength to be 4+/5 to allow pt to go up and down steps in a reciprocal mannner.    Time 6   Period Weeks   Status Partially Met   PT LONG TERM GOAL #5   Title Pt pain level no greater than a 2/10 90 % of the time to allow pt to be sleeping well    Time 6   Period Weeks   Status Achieved               Plan - 07/15/15 1638    Clinical Impression Statement Reassessment was completed today. Pt demonstrates improvements in LE strength and functional mobility, but continues to demonstrate limitations in knee flexion ROM. Pt has met 3/5 of her LTGs, but has not met her goals set for strengthening or ROM. Pt states that she wishes to be dischaged at this time to continue independently with  HEP.    PT Treatment/Interventions ADLs/Self Care Home Management;Gait training;Stair training;Functional mobility training;Therapeutic activities;Therapeutic exercise;Balance training;Neuromuscular re-education;Patient/family education;Passive range of motion;Manual techniques           G-Codes - 16-Jul-2015 1642    Functional Assessment Tool Used FOTO   Functional Limitation Mobility: Walking and moving around   Mobility: Walking and Moving Around Goal Status [phone]) At least 20 percent but less than 40 percent impaired, limited or restricted   Mobility: Walking and Moving Around Discharge Status [phone]) At least 40 percent but less than 60 percent impaired, limited or restricted      Problem List Patient Active Problem List   Diagnosis Date Noted  . Arthritis of knee, degenerative 04/28/2015  . Primary osteoarthritis of left knee   . Tibial plateau fracture   . Bloating 10/26/2011  . Constipation 08/07/2011  . Family history of colon cancer 08/07/2011  . Elevated alkaline phosphatase level 08/07/2011  . Anemia 08/07/2011    PHYSICAL THERAPY DISCHARGE SUMMARY  Visits from Start of Care: 11  Current functional level related to goals / functional outcomes: Pt demonstrates improvements in functional activity tolerance and gait. She is now able to sit and stand for longer periods of time without onset of pain, and is able to ambulate community distances without AD.    Remaining deficits: Pt continues to demonstrate decreased LE strength and decreased flexion ROM of Lt knee.    Education / Equipment: HEP  Plan: Patient agrees to discharge.  Patient goals were partially met. Patient is being discharged due to being pleased with the current functional level.  ?????        Hilma Favors, PT, DPT [phone] 07-16-2015, 4:45 PM  [place] [address] [place], [place], [address] Phone: [phone]   Fax:  [phone]

## 2015-07-15 NOTE — Patient Instructions (Signed)
Step: Up, Anterior   Stand facing step. Place involved leg up. Raise body using top leg only. Step down backward, involved leg first, lower body using other leg. Repeat __15__ times per set. Do __2__ sets per session. Do __5__ sessions per week.  Copyright  VHI. All rights reserved.  Step: Up, Lateral   Stand with side toward step. Place involved leg up. Raise body using top leg only. Step off other side, involved leg first, lower body using other leg. Repeat __15__ times per set. Do __2__ sets per session. Do __5__ sessions per week.  Copyright  VHI. All rights reserved.  FUNCTIONAL MOBILITY: Wall Squat   Stance: shoulder-width on floor, against wall. Place feet in front of hips. Bend hips and knees. Keep back straight. Do not allow knees to bend past toes. Squeeze glutes and quads to stand. _10__ reps per set, __2_ sets per day, _5__ days per week  Copyright  VHI. All rights reserved.  Without Support   Stand on one leg in neutral spine without support. Hold __15-45__ seconds. Repeat on other leg. Do __3__ repetitions every day.   http://bt.exer.us/36   Copyright  VHI. All rights reserved.

## 2015-07-19 ENCOUNTER — Encounter: Payer: Self-pay | Admitting: Orthopedic Surgery

## 2015-07-19 ENCOUNTER — Ambulatory Visit (INDEPENDENT_AMBULATORY_CARE_PROVIDER_SITE_OTHER): Payer: Commercial Managed Care - HMO | Admitting: Orthopedic Surgery

## 2015-07-19 VITALS — BP 129/63 | Ht 65.0 in | Wt 185.0 lb

## 2015-07-19 DIAGNOSIS — M129 Arthropathy, unspecified: Secondary | ICD-10-CM | POA: Diagnosis not present

## 2015-07-19 DIAGNOSIS — M1711 Unilateral primary osteoarthritis, right knee: Secondary | ICD-10-CM

## 2015-07-19 DIAGNOSIS — Z4789 Encounter for other orthopedic aftercare: Secondary | ICD-10-CM | POA: Diagnosis not present

## 2015-07-19 DIAGNOSIS — Z96652 Presence of left artificial knee joint: Secondary | ICD-10-CM | POA: Diagnosis not present

## 2015-07-19 DIAGNOSIS — M25561 Pain in right knee: Secondary | ICD-10-CM | POA: Diagnosis not present

## 2015-07-19 NOTE — Progress Notes (Signed)
Patient ID: Doris Riley, female   DOB: April 04, 1941, 74 y.o.   MRN: [id number]  Follow up visit  Chief Complaint  Patient presents with  . Follow-up    6 week follow up LEFT TKA + HARDWARE REMOVAL, DOS 04/28/15    BP 129/63 mmHg  Ht  (1.651 m)  Wt 185 lb (83.915 kg)  BMI 30.79 kg/m2  Encounter Diagnoses  Name Primary?  Marland Kitchen Aftercare following surgery of the musculoskeletal system   . Status post total left knee replacement   . Arthritis of right knee Yes  . Pain in right knee     Patient comes in for her follow-up visit almost 12 weeks out from her left total knee and hardware removal and she is doing great. She has no pain in her left knee  She has osteoarthritis of the right knee and wants evaluation regarding that  Chief complaint pain right knee  History history of osteoarthritis right knee with increasing pain after left total knee replacement  Review of systems no fever chills or erythema around the right knee  Exam shows stable vital signs slight deformity range of motion is 90 motor exam is normal all ligaments are stable and neurovascular exam is intact  Established problem worse  Injection  Return in 3 months  Procedure note right knee injection verbal consent was obtained to inject right knee joint  Timeout was completed to confirm the site of injection  The medications used were 40 mg of Depo-Medrol and 1% lidocaine 3 cc  Anesthesia was provided by ethyl chloride and the skin was prepped with alcohol.  After cleaning the skin with alcohol a 20-gauge needle was used to inject the right knee joint. There were no complications. A sterile bandage was applied.

## 2015-07-19 NOTE — Patient Instructions (Signed)
Joint Injection  Care After  Refer to this sheet in the next few days. These instructions provide you with information on caring for yourself after you have had a joint injection. Your caregiver also may give you more specific instructions. Your treatment has been planned according to current medical practices, but problems sometimes occur. Call your caregiver if you have any problems or questions after your procedure.  After any type of joint injection, it is not uncommon to experience:  · Soreness, swelling, or bruising around the injection site.  · Mild numbness, tingling, or weakness around the injection site caused by the numbing medicine used before or with the injection.  It also is possible to experience the following effects associated with the specific agent after injection:  · Iodine-based contrast agents:  ¨ Allergic reaction (itching, hives, widespread redness, and swelling beyond the injection site).  · Corticosteroids (These effects are rare.):  ¨ Allergic reaction.  ¨ Increased blood sugar levels (If you have diabetes and you notice that your blood sugar levels have increased, notify your caregiver).  ¨ Increased blood pressure levels.  ¨ Mood swings.  · Hyaluronic acid in the use of viscosupplementation.  ¨ Temporary heat or redness.  ¨ Temporary rash and itching.  ¨ Increased fluid accumulation in the injected joint.  These effects all should resolve within a day after your procedure.   HOME CARE INSTRUCTIONS  · Limit yourself to light activity the day of your procedure. Avoid lifting heavy objects, bending, stooping, or twisting.  · Take prescription or over-the-counter pain medication as directed by your caregiver.  · You may apply ice to your injection site to reduce pain and swelling the day of your procedure. Ice may be applied 03-04 times:  ¨ Put ice in a plastic bag.  ¨ Place a towel between your skin and the bag.  ¨ Leave the ice on for no longer than 15-20 minutes each time.  SEEK  IMMEDIATE MEDICAL CARE IF:   · Pain and swelling get worse rather than better or extend beyond the injection site.  · Numbness does not go away.  · Blood or fluid continues to leak from the injection site.  · You have chest pain.  · You have swelling of your face or tongue.  · You have trouble breathing or you become dizzy.  · You develop a fever, chills, or severe tenderness at the injection site that last longer than 1 day.  MAKE SURE YOU:  · Understand these instructions.  · Watch your condition.  · Get help right away if you are not doing well or if you get worse.  Document Released: 06/22/2011 Document Revised: 01/01/2012 Document Reviewed: 06/22/2011  ExitCare® Patient Information ©2015 [employer]. This information is not intended to replace advice given to you by your health care provider. Make sure you discuss any questions you have with your health care provider.

## 2015-08-05 ENCOUNTER — Other Ambulatory Visit (HOSPITAL_COMMUNITY): Payer: Self-pay | Admitting: Pulmonary Disease

## 2015-08-05 DIAGNOSIS — Z1231 Encounter for screening mammogram for malignant neoplasm of breast: Secondary | ICD-10-CM

## 2015-08-11 ENCOUNTER — Ambulatory Visit (HOSPITAL_COMMUNITY)
Admission: RE | Admit: 2015-08-11 | Discharge: 2015-08-11 | Disposition: A | Discharge status: Home / Self Care | Payer: Commercial Managed Care - HMO | Source: Ambulatory Visit | Attending: Pulmonary Disease | Admitting: Pulmonary Disease

## 2015-08-11 DIAGNOSIS — Z1231 Encounter for screening mammogram for malignant neoplasm of breast: Secondary | ICD-10-CM

## 2015-10-19 ENCOUNTER — Ambulatory Visit: Payer: Commercial Managed Care - HMO | Admitting: Orthopedic Surgery

## 2015-10-28 ENCOUNTER — Ambulatory Visit (INDEPENDENT_AMBULATORY_CARE_PROVIDER_SITE_OTHER): Payer: Commercial Managed Care - HMO | Admitting: Orthopedic Surgery

## 2015-10-28 VITALS — BP 158/77 | Ht 65.0 in | Wt 185.0 lb

## 2015-10-28 DIAGNOSIS — Z96652 Presence of left artificial knee joint: Secondary | ICD-10-CM

## 2015-10-28 DIAGNOSIS — M129 Arthropathy, unspecified: Secondary | ICD-10-CM | POA: Diagnosis not present

## 2015-10-28 DIAGNOSIS — M1711 Unilateral primary osteoarthritis, right knee: Secondary | ICD-10-CM | POA: Diagnosis not present

## 2015-10-28 NOTE — Progress Notes (Signed)
Routine follow-up visit  Chief Complaint  Patient presents with  . Follow-up    3 month recheck on left TKA and hardware removal, DOS 04-28-15    She had a left total knee with hardware removal in July doing well no problems has no complaints  New complaint pain right knee  Review of systems no catching locking or giving way but dull aching pain right knee had a history of osteoarthritis in that knee as well Severity is moderate timing is intermittent   Knee appears to be collapsing into valgus  Past Medical History  Diagnosis Date  . Hyperlipidemia   . Hypertension   . Anemia     mild  . Constipation   . Depression   . Arthritis    Physical examination  BP 158/77 mmHg  Ht 5\' 5"  (1.651 m)  Wt 185 lb (83.915 kg)  BMI 30.79 kg/m2  Her ambulatory status shows that the right leg is going in the valgus the left knee is functioning normally. The left knee incision is well-healed the alignment is normal her knee flexion is 115 knee is stable to motor exam is normal she's neurovascularly intact  The right knee shows no effusion valgus alignment knee flexion 102 5 motor exam is normal knee is stable neurovascular exam intact  Diagnosis status post left total knee functioning well no further treatment needed today x-ray at one year  Right knee osteoarthritis recommend intra-articular injection  Procedure note right knee injection verbal consent was obtained to inject right knee joint  Timeout was completed to confirm the site of injection  The medications used were 40 mg of Depo-Medrol and 1% lidocaine 3 cc  Anesthesia was provided by ethyl chloride and the skin was prepped with alcohol.  After cleaning the skin with alcohol a 20-gauge needle was used to inject the right knee joint. There were no complications. A sterile bandage was applied.

## 2015-10-28 NOTE — Patient Instructions (Signed)
Joint Injection  Care After  Refer to this sheet in the next few days. These instructions provide you with information on caring for yourself after you have had a joint injection. Your caregiver also may give you more specific instructions. Your treatment has been planned according to current medical practices, but problems sometimes occur. Call your caregiver if you have any problems or questions after your procedure.  After any type of joint injection, it is not uncommon to experience:  · Soreness, swelling, or bruising around the injection site.  · Mild numbness, tingling, or weakness around the injection site caused by the numbing medicine used before or with the injection.  It also is possible to experience the following effects associated with the specific agent after injection:  · Iodine-based contrast agents:  ¨ Allergic reaction (itching, hives, widespread redness, and swelling beyond the injection site).  · Corticosteroids (These effects are rare.):  ¨ Allergic reaction.  ¨ Increased blood sugar levels (If you have diabetes and you notice that your blood sugar levels have increased, notify your caregiver).  ¨ Increased blood pressure levels.  ¨ Mood swings.  · Hyaluronic acid in the use of viscosupplementation.  ¨ Temporary heat or redness.  ¨ Temporary rash and itching.  ¨ Increased fluid accumulation in the injected joint.  These effects all should resolve within a day after your procedure.   HOME CARE INSTRUCTIONS  · Limit yourself to light activity the day of your procedure. Avoid lifting heavy objects, bending, stooping, or twisting.  · Take prescription or over-the-counter pain medication as directed by your caregiver.  · You may apply ice to your injection site to reduce pain and swelling the day of your procedure. Ice may be applied 03-04 times:  ¨ Put ice in a plastic bag.  ¨ Place a towel between your skin and the bag.  ¨ Leave the ice on for no longer than 15-20 minutes each time.  SEEK  IMMEDIATE MEDICAL CARE IF:   · Pain and swelling get worse rather than better or extend beyond the injection site.  · Numbness does not go away.  · Blood or fluid continues to leak from the injection site.  · You have chest pain.  · You have swelling of your face or tongue.  · You have trouble breathing or you become dizzy.  · You develop a fever, chills, or severe tenderness at the injection site that last longer than 1 day.  MAKE SURE YOU:  · Understand these instructions.  · Watch your condition.  · Get help right away if you are not doing well or if you get worse.  Document Released: 06/22/2011 Document Revised: 01/01/2012 Document Reviewed: 06/22/2011  ExitCare® Patient Information ©2015 [employer]. This information is not intended to replace advice given to you by your health care provider. Make sure you discuss any questions you have with your health care provider.

## 2015-12-31 DIAGNOSIS — I10 Essential (primary) hypertension: Secondary | ICD-10-CM | POA: Diagnosis not present

## 2015-12-31 DIAGNOSIS — M1990 Unspecified osteoarthritis, unspecified site: Secondary | ICD-10-CM | POA: Diagnosis not present

## 2015-12-31 DIAGNOSIS — I129 Hypertensive chronic kidney disease with stage 1 through stage 4 chronic kidney disease, or unspecified chronic kidney disease: Secondary | ICD-10-CM | POA: Diagnosis not present

## 2015-12-31 DIAGNOSIS — E785 Hyperlipidemia, unspecified: Secondary | ICD-10-CM | POA: Diagnosis not present

## 2016-01-05 DIAGNOSIS — I10 Essential (primary) hypertension: Secondary | ICD-10-CM | POA: Diagnosis not present

## 2016-01-05 DIAGNOSIS — I129 Hypertensive chronic kidney disease with stage 1 through stage 4 chronic kidney disease, or unspecified chronic kidney disease: Secondary | ICD-10-CM | POA: Diagnosis not present

## 2016-01-05 DIAGNOSIS — E785 Hyperlipidemia, unspecified: Secondary | ICD-10-CM | POA: Diagnosis not present

## 2016-01-05 DIAGNOSIS — M1990 Unspecified osteoarthritis, unspecified site: Secondary | ICD-10-CM | POA: Diagnosis not present

## 2016-03-30 ENCOUNTER — Ambulatory Visit: Payer: Commercial Managed Care - HMO | Admitting: Orthopedic Surgery

## 2016-04-10 ENCOUNTER — Ambulatory Visit: Payer: Commercial Managed Care - HMO | Admitting: Orthopedic Surgery

## 2016-05-08 ENCOUNTER — Ambulatory Visit: Payer: Commercial Managed Care - HMO | Admitting: Orthopedic Surgery

## 2016-05-24 ENCOUNTER — Encounter: Payer: Commercial Managed Care - HMO | Admitting: Orthopedic Surgery

## 2016-06-13 ENCOUNTER — Encounter: Payer: Self-pay | Admitting: Orthopedic Surgery

## 2016-08-28 ENCOUNTER — Encounter: Payer: Self-pay | Admitting: Gastroenterology

## 2016-12-18 DIAGNOSIS — I10 Essential (primary) hypertension: Secondary | ICD-10-CM | POA: Diagnosis not present

## 2016-12-18 DIAGNOSIS — L02411 Cutaneous abscess of right axilla: Secondary | ICD-10-CM | POA: Diagnosis not present

## 2017-01-02 DIAGNOSIS — L0292 Furuncle, unspecified: Secondary | ICD-10-CM | POA: Diagnosis not present

## 2017-01-02 DIAGNOSIS — I10 Essential (primary) hypertension: Secondary | ICD-10-CM | POA: Diagnosis not present

## 2017-01-02 DIAGNOSIS — M1990 Unspecified osteoarthritis, unspecified site: Secondary | ICD-10-CM | POA: Diagnosis not present

## 2017-01-02 DIAGNOSIS — E785 Hyperlipidemia, unspecified: Secondary | ICD-10-CM | POA: Diagnosis not present

## 2017-01-05 DIAGNOSIS — E785 Hyperlipidemia, unspecified: Secondary | ICD-10-CM | POA: Diagnosis not present

## 2017-01-05 DIAGNOSIS — M1990 Unspecified osteoarthritis, unspecified site: Secondary | ICD-10-CM | POA: Diagnosis not present

## 2017-01-05 DIAGNOSIS — I10 Essential (primary) hypertension: Secondary | ICD-10-CM | POA: Diagnosis not present

## 2017-02-01 DIAGNOSIS — H5200 Hypermetropia, unspecified eye: Secondary | ICD-10-CM | POA: Diagnosis not present

## 2017-02-01 DIAGNOSIS — H25813 Combined forms of age-related cataract, bilateral: Secondary | ICD-10-CM | POA: Diagnosis not present

## 2018-01-09 ENCOUNTER — Other Ambulatory Visit (HOSPITAL_COMMUNITY): Payer: Self-pay | Admitting: Pulmonary Disease

## 2018-01-09 DIAGNOSIS — Z1231 Encounter for screening mammogram for malignant neoplasm of breast: Secondary | ICD-10-CM

## 2018-01-10 ENCOUNTER — Ambulatory Visit (HOSPITAL_COMMUNITY): Payer: Commercial Managed Care - HMO

## 2018-01-10 DIAGNOSIS — E785 Hyperlipidemia, unspecified: Secondary | ICD-10-CM | POA: Diagnosis not present

## 2018-01-10 DIAGNOSIS — M1990 Unspecified osteoarthritis, unspecified site: Secondary | ICD-10-CM | POA: Diagnosis not present

## 2018-01-10 DIAGNOSIS — R413 Other amnesia: Secondary | ICD-10-CM | POA: Diagnosis not present

## 2018-01-10 DIAGNOSIS — I10 Essential (primary) hypertension: Secondary | ICD-10-CM | POA: Diagnosis not present

## 2018-01-11 ENCOUNTER — Encounter (HOSPITAL_COMMUNITY): Payer: Self-pay

## 2018-01-11 ENCOUNTER — Ambulatory Visit (HOSPITAL_COMMUNITY)
Admission: RE | Admit: 2018-01-11 | Discharge: 2018-01-11 | Disposition: A | Discharge status: Home / Self Care | Payer: Medicare HMO | Source: Ambulatory Visit | Attending: Pulmonary Disease | Admitting: Pulmonary Disease

## 2018-01-11 DIAGNOSIS — Z1231 Encounter for screening mammogram for malignant neoplasm of breast: Secondary | ICD-10-CM | POA: Diagnosis not present

## 2018-01-11 LAB — CMP 10231
AST: 25
Albumin/Globulin Ratio: 1.3
Albumin: 4.6
Alkaline Phosphatase: 191
BUN/Creatinine Ratio: 8
BUN: 7 (ref 4–21)
Calcium: 10.2
Carbon Dioxide, Total: 19
Chloride: 102
Creat: 0.92
EGFR (African American): 71
EGFR (Non-African Amer.): 62
Globulin, Total: 3.6
Glucose: 89
Potassium: 3.3
Sodium: 143
Total Bilirubin: 0.6
Total Protein: 8.2 (ref 6.4–8.2)

## 2018-01-11 LAB — CBC WITH DIFF/PLATELET
BASO(ABSOLUTE): 0
Basophils: 0
Eosinophils Absolute: 0
Eosinophils, %: 1
HCT: 40 (ref 29–41)
Hemoglobin: 12.9
Immature Granulocytes: 0
LYMPH: 17 %
Lymphs Abs: 1.5
MCH: 27.9
MCHC: 32
MCV: 87 (ref 76–111)
Monocytes(Absolute): 1
Monocytes: 11
Neutro Abs: 6.4
Neutrophils: 71
RBC: 4.62 (ref 3.87–5.11)
RDW: 13.2
WBC: 9
platelet count: 447

## 2018-01-11 LAB — TSH: TSH: 1.25

## 2018-01-11 LAB — LIPID PANEL
Cholesterol, Total: 247
HDL Cholesterol: 71 — AB (ref 35–70)
LDL Cholesterol: 157
Triglycerides: 94 (ref 40–160)
VLDL Cholesterol Cal: 19

## 2018-01-11 LAB — VITAMIN B12: VITAMIN B12: 284

## 2018-01-17 ENCOUNTER — Other Ambulatory Visit (HOSPITAL_COMMUNITY): Payer: Self-pay | Admitting: Pulmonary Disease

## 2018-01-17 DIAGNOSIS — R4182 Altered mental status, unspecified: Secondary | ICD-10-CM

## 2018-01-18 ENCOUNTER — Ambulatory Visit (HOSPITAL_COMMUNITY)
Admission: RE | Admit: 2018-01-18 | Discharge: 2018-01-18 | Disposition: A | Discharge status: Home / Self Care | Payer: Medicare HMO | Source: Ambulatory Visit | Attending: Pulmonary Disease | Admitting: Pulmonary Disease

## 2018-01-18 DIAGNOSIS — R4182 Altered mental status, unspecified: Secondary | ICD-10-CM

## 2018-01-18 DIAGNOSIS — R440 Auditory hallucinations: Secondary | ICD-10-CM | POA: Diagnosis not present

## 2018-01-24 DIAGNOSIS — I129 Hypertensive chronic kidney disease with stage 1 through stage 4 chronic kidney disease, or unspecified chronic kidney disease: Secondary | ICD-10-CM | POA: Diagnosis not present

## 2018-01-24 DIAGNOSIS — E785 Hyperlipidemia, unspecified: Secondary | ICD-10-CM | POA: Diagnosis not present

## 2018-01-24 DIAGNOSIS — F0390 Unspecified dementia without behavioral disturbance: Secondary | ICD-10-CM | POA: Diagnosis not present

## 2018-01-24 DIAGNOSIS — I10 Essential (primary) hypertension: Secondary | ICD-10-CM | POA: Diagnosis not present

## 2018-03-07 DIAGNOSIS — I129 Hypertensive chronic kidney disease with stage 1 through stage 4 chronic kidney disease, or unspecified chronic kidney disease: Secondary | ICD-10-CM | POA: Diagnosis not present

## 2018-03-07 DIAGNOSIS — I10 Essential (primary) hypertension: Secondary | ICD-10-CM | POA: Diagnosis not present

## 2018-03-07 DIAGNOSIS — E785 Hyperlipidemia, unspecified: Secondary | ICD-10-CM | POA: Diagnosis not present

## 2018-03-07 DIAGNOSIS — F0390 Unspecified dementia without behavioral disturbance: Secondary | ICD-10-CM | POA: Diagnosis not present

## 2018-04-22 DIAGNOSIS — I129 Hypertensive chronic kidney disease with stage 1 through stage 4 chronic kidney disease, or unspecified chronic kidney disease: Secondary | ICD-10-CM | POA: Diagnosis not present

## 2018-04-22 DIAGNOSIS — M1990 Unspecified osteoarthritis, unspecified site: Secondary | ICD-10-CM | POA: Diagnosis not present

## 2018-04-22 DIAGNOSIS — E785 Hyperlipidemia, unspecified: Secondary | ICD-10-CM | POA: Diagnosis not present

## 2018-04-22 DIAGNOSIS — F0390 Unspecified dementia without behavioral disturbance: Secondary | ICD-10-CM | POA: Diagnosis not present

## 2018-06-26 DIAGNOSIS — I10 Essential (primary) hypertension: Secondary | ICD-10-CM | POA: Diagnosis not present

## 2018-06-26 DIAGNOSIS — E785 Hyperlipidemia, unspecified: Secondary | ICD-10-CM | POA: Diagnosis not present

## 2018-06-26 DIAGNOSIS — F0390 Unspecified dementia without behavioral disturbance: Secondary | ICD-10-CM | POA: Diagnosis not present

## 2018-06-26 DIAGNOSIS — G4700 Insomnia, unspecified: Secondary | ICD-10-CM | POA: Diagnosis not present

## 2018-11-20 ENCOUNTER — Ambulatory Visit: Payer: Self-pay | Admitting: Orthopedic Surgery

## 2018-11-21 ENCOUNTER — Encounter: Payer: Self-pay | Admitting: Orthopedic Surgery

## 2018-11-29 ENCOUNTER — Emergency Department (HOSPITAL_COMMUNITY)
Admission: EM | Admit: 2018-11-29 | Discharge: 2018-11-29 | Disposition: A | Discharge status: Home / Self Care | Payer: Medicare Other

## 2018-11-29 NOTE — ED Triage Notes (Signed)
Per registration patient left ?

## 2018-11-29 NOTE — ED Triage Notes (Signed)
Patient called for Triage, no answer. 

## 2018-11-30 ENCOUNTER — Emergency Department (HOSPITAL_COMMUNITY): Payer: Medicare Other

## 2018-11-30 ENCOUNTER — Emergency Department (HOSPITAL_COMMUNITY)
Admission: EM | Admit: 2018-11-30 | Discharge: 2018-11-30 | Disposition: A | Discharge status: Home / Self Care | Payer: Medicare Other | Attending: Emergency Medicine | Admitting: Emergency Medicine

## 2018-11-30 ENCOUNTER — Other Ambulatory Visit: Payer: Self-pay

## 2018-11-30 ENCOUNTER — Encounter (HOSPITAL_COMMUNITY): Payer: Self-pay | Admitting: Emergency Medicine

## 2018-11-30 DIAGNOSIS — Z79899 Other long term (current) drug therapy: Secondary | ICD-10-CM | POA: Insufficient documentation

## 2018-11-30 DIAGNOSIS — F329 Major depressive disorder, single episode, unspecified: Secondary | ICD-10-CM | POA: Insufficient documentation

## 2018-11-30 DIAGNOSIS — S8991XA Unspecified injury of right lower leg, initial encounter: Secondary | ICD-10-CM | POA: Diagnosis present

## 2018-11-30 DIAGNOSIS — Z96652 Presence of left artificial knee joint: Secondary | ICD-10-CM | POA: Insufficient documentation

## 2018-11-30 DIAGNOSIS — M1711 Unilateral primary osteoarthritis, right knee: Secondary | ICD-10-CM | POA: Insufficient documentation

## 2018-11-30 DIAGNOSIS — Y92410 Unspecified street and highway as the place of occurrence of the external cause: Secondary | ICD-10-CM | POA: Insufficient documentation

## 2018-11-30 DIAGNOSIS — Y9389 Activity, other specified: Secondary | ICD-10-CM | POA: Insufficient documentation

## 2018-11-30 DIAGNOSIS — I10 Essential (primary) hypertension: Secondary | ICD-10-CM | POA: Diagnosis not present

## 2018-11-30 DIAGNOSIS — S8001XA Contusion of right knee, initial encounter: Secondary | ICD-10-CM | POA: Insufficient documentation

## 2018-11-30 DIAGNOSIS — Y998 Other external cause status: Secondary | ICD-10-CM | POA: Insufficient documentation

## 2018-11-30 DIAGNOSIS — S8000XA Contusion of unspecified knee, initial encounter: Secondary | ICD-10-CM

## 2018-11-30 MED ORDER — DEXAMETHASONE SODIUM PHOSPHATE 10 MG/ML IJ SOLN
10.0000 mg | Freq: Once | INTRAMUSCULAR | Status: AC
  Administered 2018-11-30: 10 mg via INTRAMUSCULAR
  Filled 2018-11-30: qty 1

## 2018-11-30 MED ORDER — DICLOFENAC SODIUM 1 % TD GEL: TRANSDERMAL | 1 refills | Status: DC

## 2018-11-30 MED ORDER — ONDANSETRON HCL 4 MG PO TABS
4.0000 mg | ORAL_TABLET | Freq: Once | ORAL | Status: AC
  Administered 2018-11-30: 4 mg via ORAL
  Filled 2018-11-30: qty 1

## 2018-11-30 MED ORDER — HYDROCODONE-ACETAMINOPHEN 5-325 MG PO TABS
1.0000 | ORAL_TABLET | Freq: Once | ORAL | Status: AC
  Administered 2018-11-30: 1 via ORAL
  Filled 2018-11-30: qty 1

## 2018-11-30 MED ORDER — HYDROCODONE-ACETAMINOPHEN 5-325 MG PO TABS: ORAL_TABLET | ORAL | 0 refills | Status: DC
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
## 2018-11-30 NOTE — Discharge Instructions (Addendum)
Your x-ray is negative for fracture or dislocation.  Your x-ray does show advanced arthritis of the knee.  Please apply diclofenac gel three times daily.There is also some fluid in the joint called an effusion.  Please use your knee sleeve daily.  You do not need to sleep in this device.  Please use your walker until you are seen by Dr. Romeo AppleHarrison, or the orthopedic specialist of your choice.  Please use Tylenol every 4 hours for pain or discomfort.  May use hydrocodone at bedtime if needed for soreness.  This medication may cause drowsiness, and or lightheadedness.  Please use caution getting around when taking this medication.  Please see Dr. Romeo AppleHarrison as soon as possible for additional evaluation of your knee.

## 2018-12-09 ENCOUNTER — Ambulatory Visit: Payer: Medicare Other | Admitting: Orthopedic Surgery

## 2018-12-09 ENCOUNTER — Encounter: Payer: Self-pay | Admitting: Orthopedic Surgery

## 2018-12-09 VITALS — BP 134/63 | HR 72 | Ht 66.0 in | Wt 182.0 lb

## 2018-12-09 DIAGNOSIS — M1711 Unilateral primary osteoarthritis, right knee: Secondary | ICD-10-CM

## 2018-12-09 DIAGNOSIS — M25561 Pain in right knee: Secondary | ICD-10-CM | POA: Diagnosis not present

## 2018-12-09 DIAGNOSIS — M1710 Unilateral primary osteoarthritis, unspecified knee: Secondary | ICD-10-CM
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
## 2019-01-06 ENCOUNTER — Ambulatory Visit: Payer: Medicare Other | Admitting: Orthopedic Surgery

## 2019-03-12 ENCOUNTER — Other Ambulatory Visit (HOSPITAL_COMMUNITY): Payer: Self-pay | Admitting: Pulmonary Disease

## 2019-03-12 DIAGNOSIS — Z1231 Encounter for screening mammogram for malignant neoplasm of breast: Secondary | ICD-10-CM

## 2019-03-21 ENCOUNTER — Ambulatory Visit (HOSPITAL_COMMUNITY)
Admission: RE | Admit: 2019-03-21 | Discharge: 2019-03-21 | Disposition: A | Discharge status: Home / Self Care | Payer: Medicare Other | Source: Ambulatory Visit | Attending: Pulmonary Disease | Admitting: Pulmonary Disease

## 2019-03-21 ENCOUNTER — Other Ambulatory Visit: Payer: Self-pay

## 2019-03-21 DIAGNOSIS — Z1231 Encounter for screening mammogram for malignant neoplasm of breast: Secondary | ICD-10-CM | POA: Insufficient documentation

## 2019-03-21 LAB — HM MAMMOGRAPHY

## 2019-05-07 ENCOUNTER — Ambulatory Visit: Payer: Medicare Other | Admitting: Orthopedic Surgery

## 2019-05-12 ENCOUNTER — Other Ambulatory Visit: Payer: Self-pay

## 2019-05-12 ENCOUNTER — Encounter: Payer: Self-pay | Admitting: Orthopedic Surgery

## 2019-05-12 ENCOUNTER — Ambulatory Visit (INDEPENDENT_AMBULATORY_CARE_PROVIDER_SITE_OTHER): Payer: Medicare Other | Admitting: Orthopedic Surgery

## 2019-05-12 VITALS — BP 170/69 | HR 74 | Temp 98.2°F | Ht 66.0 in | Wt 187.0 lb

## 2019-05-12 DIAGNOSIS — M25561 Pain in right knee: Secondary | ICD-10-CM

## 2019-05-12 DIAGNOSIS — G8929 Other chronic pain: Secondary | ICD-10-CM

## 2019-05-12 MED ORDER — TRAMADOL-ACETAMINOPHEN 37.5-325 MG PO TABS: 1.0000 | ORAL_TABLET | ORAL | 5 refills | Status: DC | PRN

## 2019-05-12 NOTE — Progress Notes (Signed)
Doris Riley  05/12/2019  HISTORY SECTION :  Chief Complaint  Patient presents with  . Knee Pain    Rt knee pain getting worse.    78 years old chronic knee pain progressing dementia not a surgical candidate presents for worsening right knee pain history of osteoarthritis and valgus deformity of the right knee  Patient complains of pain lateral joint line some weakness in the right lower extremity.  Dull aching pain consistent with arthritis wears a brace on the knee as a knee sleeve  Reports good relief with Voltaren gel but too expensive    PMD HAWKINS   Review of Systems  Musculoskeletal: Positive for joint pain.  Neurological: Negative for tingling and tremors.  Psychiatric/Behavioral: Positive for memory loss.     Past Medical History:  Diagnosis Date  . Anemia    mild  . Arthritis   . Constipation   . Depression   . Hyperlipidemia   . Hypertension     Past Surgical History:  Procedure Laterality Date  . ABDOMINAL HYSTERECTOMY    . COLONOSCOPY     >10 years ago  . COLONOSCOPY  09/01/2011   Procedure: COLONOSCOPY;  Surgeon: Arlyce HarmanSandi M Fields, MD;  Location: [facility];  Service: Endoscopy;  Laterality: N/A;  10:30  . HARDWARE REMOVAL Left 04/28/2015   Procedure: HARDWARE REMOVAL LEFT KNEE;  Surgeon: Vickki HearingStanley E , MD;  Location: [facility];  Service: Orthopedics;  Laterality: Left;  . KNEE SURGERY     left, fracture  . S/P Hysterectomy     partial  . TOTAL KNEE ARTHROPLASTY Left 04/28/2015   Procedure:  LEFT TOTAL KNEE ARTHROPLASTY;  Surgeon: Vickki HearingStanley E , MD;  Location: [facility];  Service: Orthopedics;  Laterality: Left;  depuy implant     No Known Allergies   Current Outpatient Medications:  .  amLODipine-benazepril (LOTREL) 10-40 MG per capsule, Take 1 capsule by mouth daily., Disp: , Rfl:  .  atorvastatin (LIPITOR) 10 MG tablet, Take 10 mg by mouth daily., Disp: , Rfl:  .  donepezil (ARICEPT) 10 MG tablet, , Disp: , Rfl:  .  escitalopram  (LEXAPRO) 10 MG tablet, , Disp: , Rfl:  .  memantine (NAMENDA) 10 MG tablet, , Disp: , Rfl:  .  Cyanocobalamin (VITAMIN B 12 PO), Take 1 tablet by mouth daily., Disp: , Rfl:  .  diclofenac sodium (VOLTAREN) 1 % GEL, Apply to the right knee TID (Patient not taking: Reported on 12/09/2018), Disp: 1 Tube, Rfl: 1 .  Green Tea, Camillia sinensis, (GREEN TEA PO), Take 8 oz by mouth every evening., Disp: , Rfl:  .  HYDROcodone-acetaminophen (NORCO/VICODIN) 5-325 MG tablet, 1 at hs or q6h prn pain (Patient not taking: Reported on 12/09/2018), Disp: 10 tablet, Rfl: 0 .  IRON PO, Take 65 mg by mouth daily., Disp: , Rfl:  .  Multiple Vitamin (ONE-DAILY MULTI VITAMINS PO), Take 1 tablet by mouth daily.  , Disp: , Rfl:    PHYSICAL EXAM SECTION: 1) BP (!) 170/69   Pulse 74   Temp 98.2 F (36.8 C)   Ht 5\' 6"  (1.676 m)   Wt 187 lb (84.8 kg)   BMI 30.18 kg/m   Body mass index is 30.18 kg/m. General appearance: Well-developed well-nourished no gross deformities  2) Cardiovascular normal pulse and perfusion  normal color without edema  3) Neurologically deep tendon reflexes are equal and normal, no sensation loss or deficits no pathologic reflexes  4) Psychological: Awake alert and oriented  x3 mood and affect normal  5) Skin no lacerations or ulcerations no nodularity no palpable masses, no erythema or nodularity  6) Musculoskeletal:  Right knee is swollen skin is normal joint effusion is noted flexion is noted to have significant crepitance pain in the lateral joint line tenderness there knee is stable overall range of motion 115 degrees collateral ligaments are stable  MEDICAL DECISION SECTION:  No diagnosis found.  Imaging No new imaging last x-ray from December 12, 2018, ER film I read this as:  Severe valgus osteoarthritis  Plan:  (Rx., Inj., surg., Frx, MRI/CT, XR:2)  Procedure note right knee injection   verbal consent was obtained to inject right knee joint  Timeout was completed  to confirm the site of injection  The medications used were 40 mg of Depo-Medrol and 1% lidocaine 3 cc  Anesthesia was provided by ethyl chloride and the skin was prepped with alcohol.  After cleaning the skin with alcohol a 20-gauge needle was used to inject the right knee joint. There were no complications. A sterile bandage was applied.  Meds ordered this encounter  Medications  . traMADol-acetaminophen (ULTRACET) 37.5-325 MG tablet    Sig: Take 1 tablet by mouth every 4 (four) hours as needed.    Dispense:  90 tablet    Refill:  5    Add Voltaren gel generic follow-up as needed  10:16 AM

## 2019-05-12 NOTE — Patient Instructions (Signed)
VOLTAREN GEL

## 2019-05-20 ENCOUNTER — Other Ambulatory Visit: Payer: Self-pay

## 2019-05-20 ENCOUNTER — Other Ambulatory Visit: Payer: Medicare Other

## 2019-05-20 DIAGNOSIS — Z20822 Contact with and (suspected) exposure to covid-19: Secondary | ICD-10-CM

## 2019-05-22 LAB — NOVEL CORONAVIRUS, NAA: SARS-CoV-2, NAA: NOT DETECTED

## 2019-06-13 LAB — CBC WITH DIFF/PLATELET
BASO(ABSOLUTE): 0.1
Basophils: 1
Eosinophils Absolute: 0
Eosinophils, %: 2
HCT: 36 (ref 29–41)
Hemoglobin: 11.6
Immature Granulocytes: 0
LYMPH: 27 %
Lymphs Abs: 1.2
MCH: 28.3
MCHC: 32.5
MCV: 87 (ref 76–111)
Monocytes(Absolute): 0.7
Monocytes: 15
Neutro Abs: 2.4
Neutrophils: 55
RBC: 4.1 (ref 3.87–5.11)
RDW: 11.4
WBC: 4.4
platelet count: 336

## 2019-06-14 LAB — CMP 10231
ALT: 19 (ref 3–30)
AST: 26
Albumin/Globulin Ratio: 1.8
Albumin: 4.7
Alkaline Phosphatase: 174
BUN/Creatinine Ratio: 12
BUN: 18 (ref 4–21)
Calcium: 10
Carbon Dioxide, Total: 25
Chloride: 102
Creat: 1.47
EGFR (African American): 40
EGFR (Non-African Amer.): 34
Globulin, Total: 2.6
Glucose: 102
Potassium: 4.8
Sodium: 142
Total Bilirubin: 0.4
Total Protein: 7.3 (ref 6.4–8.2)

## 2019-06-14 LAB — LIPID PANEL
Cholesterol, Total: 163
HDL Cholesterol: 57 (ref 35–70)
LDL Cholesterol: 91
Triglycerides: 75 (ref 40–160)
VLDL Cholesterol Cal: 15

## 2019-06-14 LAB — TSH: TSH: 1.03

## 2019-11-18 ENCOUNTER — Other Ambulatory Visit: Payer: Self-pay | Admitting: Orthopedic Surgery

## 2019-11-18 DIAGNOSIS — G8929 Other chronic pain: Secondary | ICD-10-CM

## 2019-11-26 ENCOUNTER — Telehealth: Payer: Self-pay

## 2019-11-26 NOTE — Telephone Encounter (Signed)
Faith Little called asking if Dr. Romeo Apple would do a refill on patient's BP medication until she gets to see her new doctor replacing Dr. Juanetta Gosling. It is as follows:  Amlodipine-benazepril (LOTREL) 10-40 MG per capsule  Take 1 capsule by mouth daily.  PATIENT USES [facility] ON [address]  Patient was last seen here in July, 2020. I explained that he may not be able to do this, but that I would send the message.

## 2019-11-26 NOTE — Telephone Encounter (Signed)
NO WE CANT DO THAT

## 2019-11-26 NOTE — Telephone Encounter (Signed)
I have called patient to advise. She voiced understanding.  

## 2019-12-01 ENCOUNTER — Ambulatory Visit (INDEPENDENT_AMBULATORY_CARE_PROVIDER_SITE_OTHER): Payer: Medicare HMO | Admitting: Family Medicine

## 2019-12-01 ENCOUNTER — Other Ambulatory Visit: Payer: Self-pay

## 2019-12-01 ENCOUNTER — Encounter: Payer: Self-pay | Admitting: Family Medicine

## 2019-12-01 VITALS — BP 131/72 | HR 71 | Temp 97.9°F | Ht 65.5 in | Wt 173.6 lb

## 2019-12-01 DIAGNOSIS — F0391 Unspecified dementia with behavioral disturbance: Secondary | ICD-10-CM | POA: Diagnosis not present

## 2019-12-01 DIAGNOSIS — E785 Hyperlipidemia, unspecified: Secondary | ICD-10-CM | POA: Diagnosis not present

## 2019-12-01 DIAGNOSIS — G8929 Other chronic pain: Secondary | ICD-10-CM

## 2019-12-01 DIAGNOSIS — M25561 Pain in right knee: Secondary | ICD-10-CM

## 2019-12-01 DIAGNOSIS — I10 Essential (primary) hypertension: Secondary | ICD-10-CM | POA: Diagnosis not present

## 2019-12-01 DIAGNOSIS — F03918 Unspecified dementia, unspecified severity, with other behavioral disturbance: Secondary | ICD-10-CM | POA: Insufficient documentation

## 2019-12-01 LAB — BASIC METABOLIC PANEL
BUN/Creatinine Ratio: 12 (calc) (ref 6–22)
BUN: 12 mg/dL (ref 7–25)
CO2: 29 mmol/L (ref 20–32)
Calcium: 10.1 mg/dL (ref 8.6–10.4)
Chloride: 105 mmol/L (ref 98–110)
Creat: 0.99 mg/dL — ABNORMAL HIGH (ref 0.60–0.93)
Glucose, Bld: 121 mg/dL — ABNORMAL HIGH (ref 65–99)
Potassium: 3.8 mmol/L (ref 3.5–5.3)
Sodium: 142 mmol/L (ref 135–146)

## 2019-12-01 MED ORDER — ATORVASTATIN CALCIUM 10 MG PO TABS: 10.0000 mg | ORAL_TABLET | Freq: Every day | ORAL | 3 refills | Status: AC

## 2019-12-01 MED ORDER — DONEPEZIL HCL 10 MG PO TABS: 10.0000 mg | ORAL_TABLET | Freq: Every day | ORAL | 1 refills | Status: AC

## 2019-12-01 MED ORDER — ESCITALOPRAM OXALATE 10 MG PO TABS: 10.0000 mg | ORAL_TABLET | ORAL | 1 refills | Status: DC

## 2019-12-01 MED ORDER — MEMANTINE HCL 10 MG PO TABS: 10.0000 mg | ORAL_TABLET | Freq: Two times a day (BID) | ORAL | 1 refills | Status: AC

## 2019-12-01 MED ORDER — AMLODIPINE BESY-BENAZEPRIL HCL 10-40 MG PO CAPS: 1.0000 | ORAL_CAPSULE | Freq: Every day | ORAL | 1 refills | Status: DC

## 2019-12-01 NOTE — Progress Notes (Signed)
New Patient Office Visit  Subjective:  Patient ID: Doris Riley, female    DOB: 03/15/1941  Age: 79 y.o. MRN: [id number]  CC:  Chief Complaint  Patient presents with  . Establish Care  . Knee Pain    Right Knee  . Medication Refill    HPI Doris Riley presents for HTN/Dementia/Hyperlipidemia Right knee pain-arthritis  Past Medical History:  Diagnosis Date  . Anemia    mild  . Arthritis   . Constipation   . Depression   . Hyperlipidemia   . Hypertension     Past Surgical History:  Procedure Laterality Date  . ABDOMINAL HYSTERECTOMY    . COLONOSCOPY     >10 years ago  . COLONOSCOPY  09/01/2011   Procedure: COLONOSCOPY;  Surgeon: Arlyce Harman, MD;  [facility];  Service: Endoscopy;  Laterality: N/A;  10:30  . HARDWARE REMOVAL Left 04/28/2015   Procedure: HARDWARE REMOVAL LEFT KNEE;  Surgeon: Vickki Hearing, MD;  Location: [facility];  Service: Orthopedics;  Laterality: Left;  . KNEE SURGERY     left, fracture  . S/P Hysterectomy     partial  . TOTAL KNEE ARTHROPLASTY Left 04/28/2015   Procedure:  LEFT TOTAL KNEE ARTHROPLASTY;  Surgeon: Vickki Hearing, MD;  Location: [facility];  Service: Orthopedics;  Laterality: Left;  depuy implant    Family History  Problem Relation Age of Onset  . Colon cancer Mother        68s  . Prostate cancer Father   . GI problems Brother        colostomy, patient not sure why  . Colon cancer Brother     Social History   Socioeconomic History  . Marital status: Divorced    Spouse name: Not on file  . Number of children: 1  . Years of education: Not on file  . Highest education level: Not on file  Occupational History  . Occupation: retired    Associate Professor: RETIRED  Tobacco Use  . Smoking status: Never Smoker  . Smokeless tobacco: Never Used  Substance and Sexual Activity  . Alcohol use: No  . Drug use: No  . Sexual activity: Yes    Birth control/protection: Surgical  Other Topics Concern  . Not on file   Social History Narrative  . Not on file   Social Determinants of Health   Financial Resource Strain:   . Difficulty of Paying Living Expenses: Not on file  Food Insecurity:   . Worried About Programme researcher, broadcasting/film/video in the Last Year: Not on file  . Ran Out of Food in the Last Year: Not on file  Transportation Needs:   . Lack of Transportation (Medical): Not on file  . Lack of Transportation (Non-Medical): Not on file  Physical Activity:   . Days of Exercise per Week: Not on file  . Minutes of Exercise per Session: Not on file  Stress:   . Feeling of Stress : Not on file  Social Connections:   . Frequency of Communication with Friends and Family: Not on file  . Frequency of Social Gatherings with Friends and Family: Not on file  . Attends Religious Services: Not on file  . Active Member of Clubs or Organizations: Not on file  . Attends Banker Meetings: Not on file  . Marital Status: Not on file  Intimate Partner Violence:   . Fear of Current or Ex-Partner: Not on file  . Emotionally Abused: Not  on file  . Physically Abused: Not on file  . Sexually Abused: Not on file    ROS Review of Systems  Constitutional: Negative.   HENT: Negative.   Eyes:       Glasses  Respiratory: Negative.   Cardiovascular: Negative.   Gastrointestinal: Negative.   Endocrine: Negative.   Genitourinary: Negative.   Musculoskeletal: Positive for arthralgias.  Skin: Negative.   Allergic/Immunologic: Negative.   Neurological: Negative.        Dementia-mild-moderate  Hematological: Negative.   Psychiatric/Behavioral: Positive for confusion, dysphoric mood, hallucinations and sleep disturbance. The patient is nervous/anxious.     Objective:   Today's Vitals: BP 131/72 (BP Location: Left Arm, Patient Position: Sitting, Cuff Size: Normal)   Pulse 71   Temp 97.9 F (36.6 C) (Oral)   Ht 5' 5.5" (1.664 m)   Wt 173 lb 9.6 oz (78.7 kg)   SpO2 99%   BMI 28.45 kg/m   Physical  Exam Constitutional:      Appearance: Normal appearance.  HENT:     Head: Normocephalic and atraumatic.     Right Ear: Tympanic membrane normal.     Left Ear: Tympanic membrane normal.  Cardiovascular:     Rate and Rhythm: Normal rate and regular rhythm.     Pulses: Normal pulses.     Heart sounds: Normal heart sounds.  Pulmonary:     Effort: Pulmonary effort is normal.     Breath sounds: Normal breath sounds.  Musculoskeletal:        General: Swelling and deformity present.     Cervical back: Normal range of motion and neck supple.     Right lower leg: Edema present.     Comments: Right knee  Neurological:     Mental Status: She is alert.  Psychiatric:        Mood and Affect: Mood normal.     Assessment & Plan:  1. Dementia with behavioral disturbance, unspecified dementia type [facility]) - Ambulatory referral to Orthopedic Surgery - Ambulatory referral to Neurology Aricept/namenda/lexapro-behavior changes concerning-hallucinations-neuro in the past-no recent evaluation MMS 17-pt lives with son-family members check on pt during the day-pt believes she goes to work with family. Pt worried about people in home and people talking to her namenda/aricept 2. Chronic pain of right knee Orthopedic surgery-injections in the past-used Voltaren topical in the past  3. Essential hypertension Amlodipine-rx  4. Hyperlipidemia, unspecified hyperlipidemia type Atorvastatin-rx-lipid panel-8/20  Outpatient Encounter Medications as of 12/01/2019  Medication Sig  . amLODipine-benazepril (LOTREL) 10-40 MG per capsule Take 1 capsule by mouth daily.  Marland Kitchen atorvastatin (LIPITOR) 10 MG tablet Take 10 mg by mouth daily.  . Cyanocobalamin (VITAMIN B 12 PO) Take 1 tablet by mouth daily.  . diclofenac sodium (VOLTAREN) 1 % GEL Apply to the right knee TID  . donepezil (ARICEPT) 10 MG tablet   . escitalopram (LEXAPRO) 10 MG tablet   . IRON PO Take 65 mg by mouth daily.  . memantine (NAMENDA) 10 MG tablet    . Multiple Vitamin (ONE-DAILY MULTI VITAMINS PO) Take 1 tablet by mouth daily.    Nyoka Cowden Tea, Camillia sinensis, (GREEN TEA PO) Take 8 oz by mouth every evening.  . [DISCONTINUED] HYDROcodone-acetaminophen (NORCO/VICODIN) 5-325 MG tablet 1 at hs or q6h prn pain (Patient not taking: Reported on 12/09/2018)  . [DISCONTINUED] traMADol-acetaminophen (ULTRACET) 37.5-325 MG tablet Take 1 tablet by mouth every 4 (four) hours as needed. (Patient not taking: Reported on 12/01/2019)   No facility-administered encounter medications  on file as of 12/01/2019.    Follow-up: neurology referral, ortho referral 6 month  Gaylord Seydel Mat Carne, MD

## 2019-12-01 NOTE — Patient Instructions (Addendum)
Neurology referral-concern for worsening dementia Ortho referral-right knee injection, Voltaren gel  Non fasting labwork

## 2019-12-04 ENCOUNTER — Telehealth: Payer: Self-pay | Admitting: Family Medicine

## 2019-12-04 NOTE — Telephone Encounter (Signed)
Patient niece Rushie Goltz is calling and states the patient is now walking out her house and adult day care center sent a fax over to be filled out today, also she is concerned about how she does about getting her set up with assisted living facility. [phone]

## 2019-12-04 NOTE — Telephone Encounter (Signed)
Schedule the patient for an appointment to complete form for adult day care. I will need one hour. We do not have a Child psychotherapist to assist with placement. Family can call skilled care facilities for availability and cost

## 2019-12-05 NOTE — Telephone Encounter (Signed)
Pt is scheduled for Tuesday 2/16 at 8am.

## 2019-12-08 ENCOUNTER — Other Ambulatory Visit: Payer: Self-pay

## 2019-12-08 ENCOUNTER — Encounter: Payer: Self-pay | Admitting: Orthopedic Surgery

## 2019-12-08 ENCOUNTER — Ambulatory Visit (INDEPENDENT_AMBULATORY_CARE_PROVIDER_SITE_OTHER): Payer: Medicare HMO | Admitting: Orthopedic Surgery

## 2019-12-08 VITALS — BP 146/66 | HR 85 | Ht 65.5 in | Wt 170.0 lb

## 2019-12-08 DIAGNOSIS — M25561 Pain in right knee: Secondary | ICD-10-CM | POA: Diagnosis not present

## 2019-12-08 DIAGNOSIS — G8929 Other chronic pain: Secondary | ICD-10-CM

## 2019-12-08 NOTE — Progress Notes (Addendum)
Doris Riley  12/08/2019  Body mass index is 27.86 kg/m.   HISTORY SECTION :  Chief Complaint  Patient presents with  . Knee Pain    right    HPI The patient presents for evaluation of  (mild/moderate/severe/ ) moderate to severe chronic pain, in the (right /left) right knee, for several years, associated with deformity difficulty walking.  Prior treatment prior injection  Patient now has severe dementia not a surgical candidate based on her mental health   Review of Systems  Respiratory: Negative for shortness of breath.   Cardiovascular: Negative for chest pain.     has a past medical history of Anemia, Arthritis, Constipation, Depression, Hyperlipidemia, and Hypertension.   Past Surgical History:  Procedure Laterality Date  . ABDOMINAL HYSTERECTOMY    . COLONOSCOPY     >10 years ago  . COLONOSCOPY  09/01/2011   Procedure: COLONOSCOPY;  Surgeon: Dorothyann Peng, MD;  [facility];  Service: Endoscopy;  Laterality: N/A;  10:30  . HARDWARE REMOVAL Left 04/28/2015   Procedure: HARDWARE REMOVAL LEFT KNEE;  Surgeon: Carole Civil, MD;  Location: [facility];  Service: Orthopedics;  Laterality: Left;  . KNEE SURGERY     left, fracture  . S/P Hysterectomy     partial  . TOTAL KNEE ARTHROPLASTY Left 04/28/2015   Procedure:  LEFT TOTAL KNEE ARTHROPLASTY;  Surgeon: Carole Civil, MD;  Location: [facility];  Service: Orthopedics;  Laterality: Left;  depuy implant    Body mass index is 27.86 kg/m.   No Known Allergies   Current Outpatient Medications:  .  amLODipine-benazepril (LOTREL) 10-40 MG capsule, Take 1 capsule by mouth daily., Disp: 90 capsule, Rfl: 1 .  atorvastatin (LIPITOR) 10 MG tablet, Take 1 tablet (10 mg total) by mouth daily., Disp: 90 tablet, Rfl: 3 .  Cyanocobalamin (VITAMIN B 12 PO), Take 1 tablet by mouth daily., Disp: , Rfl:  .  diclofenac sodium (VOLTAREN) 1 % GEL, Apply to the right knee TID, Disp: 1 Tube, Rfl: 1 .  donepezil (ARICEPT) 10  MG tablet, Take 1 tablet (10 mg total) by mouth at bedtime., Disp: 90 tablet, Rfl: 1 .  escitalopram (LEXAPRO) 10 MG tablet, Take 1 tablet (10 mg total) by mouth 1 day or 1 dose., Disp: 30 tablet, Rfl: 1 .  Green Tea, Camillia sinensis, (GREEN TEA PO), Take 8 oz by mouth every evening., Disp: , Rfl:  .  IRON PO, Take 65 mg by mouth daily., Disp: , Rfl:  .  memantine (NAMENDA) 10 MG tablet, Take 1 tablet (10 mg total) by mouth 2 (two) times daily., Disp: 180 tablet, Rfl: 1 .  Multiple Vitamin (ONE-DAILY MULTI VITAMINS PO), Take 1 tablet by mouth daily.  , Disp: , Rfl:    PHYSICAL EXAM SECTION:  BP (!) 146/66   Pulse 85   Ht 5' 5.5" (1.664 m)   Wt 170 lb (77.1 kg)   BMI 27.86 kg/m   Body mass index is 27.86 kg/m. General appearance: Well-developed well-nourished no gross deformities  Mood affect normal  Oriented person place time  Poor short and long-term memory  She is walking without support but she has a major valgus deformity to that right leg  No pseudolaxity is noted. Right Knee Exam   Tenderness  The patient is experiencing tenderness in the medial joint line and lateral joint line.  Range of Motion  Extension: abnormal  Flexion: abnormal   Tests  Drawer:  Anterior - negative  Other  Erythema: absent Scars: absent Sensation: normal Pulse: present Swelling: none Effusion: no effusion present       MEDICAL DECISION MAKING  A.  Encounter Diagnosis  Name Primary?  . Chronic pain of right knee Yes    B. DATA ANALYSED:  C. MANAGEMENT   Recommend nonoperative in treatment and recommend injection right knee  Procedure note right knee injection   verbal consent was obtained to inject right knee joint  Timeout was completed to confirm the site of injection  The medications used were 40 mg of Depo-Medrol and 1% lidocaine 3 cc  Anesthesia was provided by ethyl chloride and the skin was prepped with alcohol.  After cleaning the skin with alcohol  a 20-gauge needle was used to inject the right knee joint. There were no complications. A sterile bandage was applied.   No orders of the defined types were placed in this encounter.     Fuller Canada, MD  12/08/2019 10:25 AM

## 2019-12-08 NOTE — Patient Instructions (Signed)

## 2019-12-09 ENCOUNTER — Other Ambulatory Visit: Payer: Self-pay

## 2019-12-09 ENCOUNTER — Ambulatory Visit: Payer: Medicare HMO

## 2019-12-09 ENCOUNTER — Ambulatory Visit (INDEPENDENT_AMBULATORY_CARE_PROVIDER_SITE_OTHER): Payer: Medicare HMO | Admitting: Family Medicine

## 2019-12-09 ENCOUNTER — Encounter: Payer: Self-pay | Admitting: Family Medicine

## 2019-12-09 VITALS — BP 124/64 | HR 67 | Temp 98.2°F | Wt 174.8 lb

## 2019-12-09 DIAGNOSIS — Z119 Encounter for screening for infectious and parasitic diseases, unspecified: Secondary | ICD-10-CM | POA: Diagnosis not present

## 2019-12-09 DIAGNOSIS — Z23 Encounter for immunization: Secondary | ICD-10-CM

## 2019-12-09 DIAGNOSIS — R7309 Other abnormal glucose: Secondary | ICD-10-CM

## 2019-12-09 LAB — POCT GLYCOSYLATED HEMOGLOBIN (HGB A1C): Hemoglobin A1C: 5.6 % (ref 4.0–5.6)

## 2019-12-09 NOTE — Progress Notes (Addendum)
Established Patient Office Visit  Subjective:  Patient ID: Doris Riley, female    DOB: 05-12-1941  Age: 79 y.o. MRN: [id number]  CC:  Chief Complaint  Patient presents with  . Dementia    re evaluation    HPI [name] presents for evaluation for adult day care. Pt need PPD. Family states that pt has started to decline in her memory-at night pt forgets where she is located and she has started to wander  HTN//Hyperlipidemia-stablized Right knee pain-arthritis Elevated glucose-no h/o DM  Past Medical History:  Diagnosis Date  . Anemia    mild  . Arthritis   . Constipation   . Depression   . Hyperlipidemia   . Hypertension     Past Surgical History:  Procedure Laterality Date  . ABDOMINAL HYSTERECTOMY    . COLONOSCOPY     >10 years ago  . COLONOSCOPY  09/01/2011   Procedure: COLONOSCOPY;  Surgeon: Arlyce Harman, MD;  [facility];  Service: Endoscopy;  Laterality: N/A;  10:30  . HARDWARE REMOVAL Left 04/28/2015   Procedure: HARDWARE REMOVAL LEFT KNEE;  Surgeon: Vickki Hearing, MD;  Location: [facility];  Service: Orthopedics;  Laterality: Left;  . KNEE SURGERY     left, fracture  . S/P Hysterectomy     partial  . TOTAL KNEE ARTHROPLASTY Left 04/28/2015   Procedure:  LEFT TOTAL KNEE ARTHROPLASTY;  Surgeon: Vickki Hearing, MD;  Location: [facility];  Service: Orthopedics;  Laterality: Left;  depuy implant    Family History  Problem Relation Age of Onset  . Colon cancer Mother        31s  . Prostate cancer Father   . GI problems Brother        colostomy, patient not sure why  . Colon cancer Brother     Social History   Socioeconomic History  . Marital status: Divorced    Spouse name: Not on file  . Number of children: 1  . Years of education: Not on file  . Highest education level: Not on file  Occupational History  . Occupation: retired    Associate Professor: RETIRED  Tobacco Use  . Smoking status: Never Smoker  . Smokeless tobacco: Never Used   Substance and Sexual Activity  . Alcohol use: No  . Drug use: No  . Sexual activity: Yes    Birth control/protection: Surgical  Other Topics Concern  . Not on file  Social History Narrative  . Not on file   Social Determinants of Health   Financial Resource Strain:   . Difficulty of Paying Living Expenses: Not on file  Food Insecurity:   . Worried About Programme researcher, broadcasting/film/video in the Last Year: Not on file  . Ran Out of Food in the Last Year: Not on file  Transportation Needs:   . Lack of Transportation (Medical): Not on file  . Lack of Transportation (Non-Medical): Not on file  Physical Activity:   . Days of Exercise per Week: Not on file  . Minutes of Exercise per Session: Not on file  Stress:   . Feeling of Stress : Not on file  Social Connections:   . Frequency of Communication with Friends and Family: Not on file  . Frequency of Social Gatherings with Friends and Family: Not on file  . Attends Religious Services: Not on file  . Active Member of Clubs or Organizations: Not on file  . Attends Banker Meetings: Not on  file  . Marital Status: Not on file  Intimate Partner Violence:   . Fear of Current or Ex-Partner: Not on file  . Emotionally Abused: Not on file  . Physically Abused: Not on file  . Sexually Abused: Not on file    ROS Review of Systems  Constitutional: Negative.   HENT: Negative.   Eyes:       Glasses  Respiratory: Negative.   Cardiovascular: Negative.   Gastrointestinal: Negative.   Endocrine: Negative.   Genitourinary: Negative.   Musculoskeletal: Positive for arthralgias.  Skin: Negative.   Allergic/Immunologic: Negative.   Neurological: Negative.        Dementia-moderate  Hematological: Negative.   Psychiatric/Behavioral: Positive for confusion, dysphoric mood, hallucinations and sleep disturbance. The patient is nervous/anxious.     Objective:   Today's Vitals: BP 124/64 (BP Location: Right Arm, Patient Position: Sitting)    Pulse 67   Temp 98.2 F (36.8 C) (Oral)   Wt 174 lb 12.8 oz (79.3 kg)   SpO2 99%   BMI 28.65 kg/m   Physical Exam Constitutional:      Appearance: Normal appearance.  HENT:     Head: Normocephalic and atraumatic.     Right Ear: Tympanic membrane normal.     Left Ear: Tympanic membrane normal.  Cardiovascular:     Rate and Rhythm: Normal rate and regular rhythm.     Pulses: Normal pulses.     Heart sounds: Normal heart sounds.  Pulmonary:     Effort: Pulmonary effort is normal.     Breath sounds: Normal breath sounds.  Musculoskeletal:        General: Swelling and deformity present.     Cervical back: Normal range of motion and neck supple.     Right lower leg: Edema present.     Comments: Right knee  Neurological:     Mental Status: She is alert.  Psychiatric:        Mood and Affect: Mood normal.    Assessment & Plan:  1. Dementia with behavioral disturbance, unspecified dementia type [facility]) - Ambulatory referral to Neurology Aricept/namenda/lexapro-behavior changes concerning-hallucinations-neuro in the past-no recent evaluation MMS 17-pt lives with son-family members check on pt during the day-pt believes she goes to work with family. Pt worried about people in home and people talking to her namenda/aricept 2. Chronic pain of right knee Orthopedic surgery-injections in the past-used Voltaren topical in the past  3. Essential hypertension Amlodipine-rx  4. Hyperlipidemia, unspecified hyperlipidemia type Atorvastatin-rx-lipid panel-8/20 5. Elevated glucose - POCT glycosylated hemoglobin (Hb A1C) Normal 5.6% 6. Screening examination for infectious disease - PPD-read on Thurs/Friday 7.Need for vaccination for Strep pneumoniae - Pneumococcal polysaccharide vaccine 23-valent greater than or equal to 2yo subcutaneous/IM Outpatient Encounter Medications as of 12/09/2019  Medication Sig  . amLODipine-benazepril (LOTREL) 10-40 MG capsule Take 1 capsule by mouth daily.   Marland Kitchen atorvastatin (LIPITOR) 10 MG tablet Take 1 tablet (10 mg total) by mouth daily.  Marland Kitchen donepezil (ARICEPT) 10 MG tablet Take 1 tablet (10 mg total) by mouth at bedtime.  Marland Kitchen escitalopram (LEXAPRO) 10 MG tablet Take 1 tablet (10 mg total) by mouth 1 day or 1 dose.  . memantine (NAMENDA) 10 MG tablet Take 1 tablet (10 mg total) by mouth 2 (two) times daily.  . [DISCONTINUED] Cyanocobalamin (VITAMIN B 12 PO) Take 1 tablet by mouth daily.  . [DISCONTINUED] diclofenac sodium (VOLTAREN) 1 % GEL Apply to the right knee TID (Patient not taking: Reported on 12/09/2019)  . [DISCONTINUED]  Green Tea, Camillia sinensis, (GREEN TEA PO) Take 8 oz by mouth every evening.  . [DISCONTINUED] IRON PO Take 65 mg by mouth daily.  . [DISCONTINUED] Multiple Vitamin (ONE-DAILY MULTI VITAMINS PO) Take 1 tablet by mouth daily.     No facility-administered encounter medications on file as of 12/09/2019.   Follow-up: neurology referral, ortho referral 6 month f/u Form completed for adult day care-PPD placed and read pending on Thurs or Friday.
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
## 2019-12-10 ENCOUNTER — Telehealth: Payer: Self-pay

## 2019-12-10 ENCOUNTER — Ambulatory Visit (INDEPENDENT_AMBULATORY_CARE_PROVIDER_SITE_OTHER): Payer: Medicare HMO | Admitting: Orthopedic Surgery

## 2019-12-10 ENCOUNTER — Ambulatory Visit: Payer: Medicare HMO

## 2019-12-10 ENCOUNTER — Encounter: Payer: Self-pay | Admitting: Orthopedic Surgery

## 2019-12-10 VITALS — BP 164/83 | HR 82 | Ht 65.5 in | Wt 174.0 lb

## 2019-12-10 DIAGNOSIS — W19XXXA Unspecified fall, initial encounter: Secondary | ICD-10-CM

## 2019-12-10 DIAGNOSIS — G8929 Other chronic pain: Secondary | ICD-10-CM

## 2019-12-10 DIAGNOSIS — M25561 Pain in right knee: Secondary | ICD-10-CM

## 2019-12-10 MED ORDER — ACETAMINOPHEN-CODEINE #3 300-30 MG PO TABS: 1.0000 | ORAL_TABLET | ORAL | 0 refills | Status: AC | PRN

## 2019-12-10 NOTE — Telephone Encounter (Signed)
Talked with Amy at [facility] and they will read the TB test result on Friday and send [place] a copy of the results.

## 2019-12-10 NOTE — Progress Notes (Signed)
Chief Complaint  Patient presents with  . Knee Pain    right/ fell yesterday has increased pain swelling    Patient fell and had an injection this week.  Went home and fell.  Complains of increased pain in the right knee   She was mopping the floor it got wet she slipped and fell  Complains of a lot of pain swelling and trouble weightbearing  Review of systems dementia memory loss joint pain no numbness no tingling  Past Medical History:  Diagnosis Date  . Anemia    mild  . Arthritis   . Constipation   . Depression   . Hyperlipidemia   . Hypertension       X-rays were negative for fracture she does have severe arthritis of the lateral compartment mild arthritis medial compartment with multiple places with secondary bone changes   BP (!) 164/83   Pulse 82   Ht 5' 5.5" (1.664 m)   Wt 174 lb (78.9 kg)   BMI 28.51 kg/m   Physical Exam  Again dementia normal development grooming and hygiene awake and alert  Right knee is swollen.  Injection site looks normal  Painful range of motion she does not fully extend it even passively without a significant pain in her flexion is about 100 degrees with pain feels stable in the coronal and sagittal plane there is no neurovascular deficit skin is intact  X-ray shows valgus knee with severe arthritis  Knee immobilizer weight-bear as tolerated come back in 3 weeks reevaluate the knee  Encounter Diagnosis  Name Primary?  . Acute pain of right knee Yes    Acute uncomplicated injury prescription management x-ray in-house no outside records

## 2019-12-10 NOTE — Patient Instructions (Signed)
Wear brace except for bathing and sleeping   Use the walker   Start pain medication for knee for 7 days

## 2019-12-11 ENCOUNTER — Ambulatory Visit: Payer: Medicare HMO

## 2019-12-11 DIAGNOSIS — R7309 Other abnormal glucose: Secondary | ICD-10-CM | POA: Insufficient documentation

## 2019-12-11 DIAGNOSIS — Z119 Encounter for screening for infectious and parasitic diseases, unspecified: Secondary | ICD-10-CM | POA: Insufficient documentation

## 2019-12-11 DIAGNOSIS — Z23 Encounter for immunization: Secondary | ICD-10-CM | POA: Insufficient documentation

## 2019-12-14 ENCOUNTER — Other Ambulatory Visit: Payer: Self-pay

## 2019-12-14 ENCOUNTER — Encounter (HOSPITAL_COMMUNITY): Payer: Self-pay | Admitting: Emergency Medicine

## 2019-12-14 ENCOUNTER — Emergency Department (HOSPITAL_COMMUNITY)
Admission: EM | Admit: 2019-12-14 | Discharge: 2019-12-14 | Disposition: A | Discharge status: Home / Self Care | Payer: Medicare HMO | Attending: Emergency Medicine | Admitting: Emergency Medicine

## 2019-12-14 DIAGNOSIS — Z79899 Other long term (current) drug therapy: Secondary | ICD-10-CM | POA: Insufficient documentation

## 2019-12-14 DIAGNOSIS — I10 Essential (primary) hypertension: Secondary | ICD-10-CM | POA: Diagnosis not present

## 2019-12-14 DIAGNOSIS — R300 Dysuria: Secondary | ICD-10-CM | POA: Diagnosis present

## 2019-12-14 DIAGNOSIS — N3090 Cystitis, unspecified without hematuria: Secondary | ICD-10-CM | POA: Diagnosis not present

## 2019-12-14 LAB — CBC WITH DIFFERENTIAL/PLATELET
Abs Immature Granulocytes: 0.03 10*3/uL (ref 0.00–0.07)
Basophils Absolute: 0.1 10*3/uL (ref 0.0–0.1)
Basophils Relative: 1 %
Eosinophils Absolute: 0.2 10*3/uL (ref 0.0–0.5)
Eosinophils Relative: 2 %
HCT: 37.9 % (ref 36.0–46.0)
Hemoglobin: 11.5 g/dL — ABNORMAL LOW (ref 12.0–15.0)
Immature Granulocytes: 0 %
Lymphocytes Relative: 12 %
Lymphs Abs: 1 10*3/uL (ref 0.7–4.0)
MCH: 28 pg (ref 26.0–34.0)
MCHC: 30.3 g/dL (ref 30.0–36.0)
MCV: 92.4 fL (ref 80.0–100.0)
Monocytes Absolute: 0.9 10*3/uL (ref 0.1–1.0)
Monocytes Relative: 11 %
Neutro Abs: 6.2 10*3/uL (ref 1.7–7.7)
Neutrophils Relative %: 74 %
Platelets: 373 10*3/uL (ref 150–400)
RBC: 4.1 MIL/uL (ref 3.87–5.11)
RDW: 13.2 % (ref 11.5–15.5)
WBC: 8.4 10*3/uL (ref 4.0–10.5)
nRBC: 0 % (ref 0.0–0.2)

## 2019-12-14 LAB — COMPREHENSIVE METABOLIC PANEL
ALT: 22 U/L (ref 0–44)
AST: 33 U/L (ref 15–41)
Albumin: 3.6 g/dL (ref 3.5–5.0)
Alkaline Phosphatase: 116 U/L (ref 38–126)
Anion gap: 9 (ref 5–15)
BUN: 19 mg/dL (ref 8–23)
CO2: 27 mmol/L (ref 22–32)
Calcium: 9.3 mg/dL (ref 8.9–10.3)
Chloride: 101 mmol/L (ref 98–111)
Creatinine, Ser: 1.16 mg/dL — ABNORMAL HIGH (ref 0.44–1.00)
GFR calc Af Amer: 53 mL/min — ABNORMAL LOW (ref >60)
GFR calc non Af Amer: 46 mL/min — ABNORMAL LOW (ref >60)
Glucose, Bld: 101 mg/dL — ABNORMAL HIGH (ref 70–99)
Potassium: 5.1 mmol/L (ref 3.5–5.1)
Sodium: 137 mmol/L (ref 135–145)
Total Bilirubin: 0.8 mg/dL (ref 0.3–1.2)
Total Protein: 8 g/dL (ref 6.5–8.1)

## 2019-12-14 LAB — URINALYSIS, ROUTINE W REFLEX MICROSCOPIC
Bilirubin Urine: NEGATIVE
Glucose, UA: NEGATIVE mg/dL
Ketones, ur: NEGATIVE mg/dL
Nitrite: POSITIVE — AB
Protein, ur: 100 mg/dL — AB
Specific Gravity, Urine: 1.024 (ref 1.005–1.030)
WBC, UA: >50 WBC/hpf — ABNORMAL HIGH (ref 0–5)
pH: 5 (ref 5.0–8.0)

## 2019-12-14 MED ORDER — SODIUM CHLORIDE 0.9 % IV SOLN
1.0000 g | Freq: Once | INTRAVENOUS | Status: AC
  Administered 2019-12-14: 12:00:00 1 g via INTRAVENOUS
  Filled 2019-12-14: qty 10

## 2019-12-14 MED ORDER — CEFTRIAXONE SODIUM 1 G IJ SOLR: 1.0000 g | Freq: Once | INTRAMUSCULAR | Status: DC

## 2019-12-14 MED ORDER — CEPHALEXIN 500 MG PO CAPS: 500.0000 mg | ORAL_CAPSULE | Freq: Four times a day (QID) | ORAL | 0 refills | Status: DC

## 2019-12-14 NOTE — Discharge Instructions (Addendum)
Drink plenty of fluids and follow-up with your doctor later this week.  Return if problems

## 2019-12-14 NOTE — ED Triage Notes (Signed)
Patient previous c/o dysuria with frequency to family after patient noted to be incontinent of urine. Denies any fevers. Per family member patient has had decrease in mental status for past year but has gotten worse with confusion in past week. Family member also asking for possible help with rehab placement.

## 2019-12-14 NOTE — ED Provider Notes (Signed)
[facility] EMERGENCY DEPARTMENT Provider Note   CSN: [id number] Arrival date & time: 12/14/19  1054     History Chief Complaint  Patient presents with  . Dysuria    Doris Riley is a 79 y.o. female.  Patient has foul-smelling urine and frequency.  She also is having increased confusion.  This is according to family members  The history is provided by a relative. No language interpreter was used.  Dysuria Pain quality:  Burning Pain severity:  Mild Onset quality:  Sudden Timing:  Constant Progression:  Worsening Chronicity:  New Recent urinary tract infections: no   Relieved by:  Nothing Worsened by:  Nothing Ineffective treatments:  None tried Urinary symptoms: discolored urine   Associated symptoms: no abdominal pain        Past Medical History:  Diagnosis Date  . Anemia    mild  . Arthritis   . Constipation   . Depression   . Hyperlipidemia   . Hypertension     Patient Active Problem List   Diagnosis Date Noted  . Elevated glucose 12/11/2019  . Need for vaccination for Strep pneumoniae 12/11/2019  . Screening examination for infectious disease 12/11/2019  . Dementia with behavioral disturbance (HCC) 12/01/2019  . Chronic pain of right knee 12/01/2019  . Essential hypertension 12/01/2019  . Hyperlipidemia 12/01/2019  . Arthritis of knee, degenerative 04/28/2015  . S/P total knee replacement, left 04/28/15   . Tibial plateau fracture   . Bloating 10/26/2011  . Constipation 08/07/2011  . Family history of colon cancer 08/07/2011  . Elevated alkaline phosphatase level 08/07/2011  . Anemia 08/07/2011    Past Surgical History:  Procedure Laterality Date  . ABDOMINAL HYSTERECTOMY    . COLONOSCOPY     >10 years ago  . COLONOSCOPY  09/01/2011   Procedure: COLONOSCOPY;  Surgeon: Arlyce Harman, MD;  Location: [facility];  Service: Endoscopy;  Laterality: N/A;  10:30  . HARDWARE REMOVAL Left 04/28/2015   Procedure: HARDWARE REMOVAL LEFT KNEE;   Surgeon: Vickki Hearing, MD;  Location: [facility] ORS;  Service: Orthopedics;  Laterality: Left;  . KNEE SURGERY     left, fracture  . S/P Hysterectomy     partial  . TOTAL KNEE ARTHROPLASTY Left 04/28/2015   Procedure:  LEFT TOTAL KNEE ARTHROPLASTY;  Surgeon: Vickki Hearing, MD;  Location: [facility] ORS;  Service: Orthopedics;  Laterality: Left;  depuy implant     OB History   No obstetric history on file.     Family History  Problem Relation Age of Onset  . Colon cancer Mother        94s  . Prostate cancer Father   . GI problems Brother        colostomy, patient not sure why  . Colon cancer Brother     Social History   Tobacco Use  . Smoking status: Never Smoker  . Smokeless tobacco: Never Used  Substance Use Topics  . Alcohol use: No  . Drug use: No    Home Medications Prior to Admission medications   Medication Sig Start Date End Date Taking? Authorizing Provider  acetaminophen-codeine (TYLENOL #3) 300-30 MG tablet Take 1 tablet by mouth every 4 (four) hours as needed for up to 7 days for moderate pain. 12/10/19 12/17/19  Vickki Hearing, MD  amLODipine-benazepril (LOTREL) 10-40 MG capsule Take 1 capsule by mouth daily. 12/01/19   Corum, Minerva Fester, MD  atorvastatin (LIPITOR) 10 MG tablet Take 1 tablet (10 mg  total) by mouth daily. 12/01/19   Corum, Rex Kras, MD  cephALEXin (KEFLEX) 500 MG capsule Take 1 capsule (500 mg total) by mouth 4 (four) times daily. 12/14/19   Milton Ferguson, MD  donepezil (ARICEPT) 10 MG tablet Take 1 tablet (10 mg total) by mouth at bedtime. 12/01/19   Corum, Rex Kras, MD  escitalopram (LEXAPRO) 10 MG tablet Take 1 tablet (10 mg total) by mouth 1 day or 1 dose. 12/01/19   Corum, Rex Kras, MD  memantine (NAMENDA) 10 MG tablet Take 1 tablet (10 mg total) by mouth 2 (two) times daily. 12/01/19   Corum, Rex Kras, MD    Allergies    Patient has no known allergies.  Review of Systems   Review of Systems  Constitutional: Negative for appetite change and fatigue.  HENT:  Negative for congestion, ear discharge and sinus pressure.   Eyes: Negative for discharge.  Respiratory: Negative for cough.   Cardiovascular: Negative for chest pain.  Gastrointestinal: Negative for abdominal pain and diarrhea.  Genitourinary: Positive for dysuria. Negative for frequency and hematuria.  Musculoskeletal: Negative for back pain.  Skin: Negative for rash.  Neurological: Negative for seizures and headaches.  Psychiatric/Behavioral: Negative for hallucinations.    Physical Exam Updated Vital Signs BP (!) 163/64 (BP Location: Right Arm)   Pulse 66   Temp 97.8 F (36.6 C) (Oral)   Resp 18   Ht 5\' 6"  (1.676 m)   Wt 78.9 kg   SpO2 100%   BMI 28.08 kg/m   Physical Exam Vitals and nursing note reviewed.  Constitutional:      Appearance: She is well-developed.  HENT:     Head: Normocephalic.     Nose: Nose normal.     Mouth/Throat:     Mouth: Mucous membranes are moist.  Eyes:     General: No scleral icterus.    Conjunctiva/sclera: Conjunctivae normal.  Neck:     Thyroid: No thyromegaly.  Cardiovascular:     Rate and Rhythm: Normal rate and regular rhythm.     Heart sounds: No murmur. No friction rub. No gallop.   Pulmonary:     Breath sounds: No stridor. No wheezing or rales.  Chest:     Chest wall: No tenderness.  Abdominal:     General: There is no distension.     Tenderness: There is no abdominal tenderness. There is no rebound.  Musculoskeletal:        General: Normal range of motion.     Cervical back: Neck supple.  Lymphadenopathy:     Cervical: No cervical adenopathy.  Skin:    Findings: No erythema or rash.  Neurological:     Mental Status: She is alert.     Motor: No abnormal muscle tone.     Coordination: Coordination normal.     Comments: Oriented to person and place  Psychiatric:        Behavior: Behavior normal.     ED Results / Procedures / Treatments   Labs (all labs ordered are listed, but only abnormal results are  displayed) Labs Reviewed  CBC WITH DIFFERENTIAL/PLATELET - Abnormal; Notable for the following components:      Result Value   Hemoglobin 11.5 (*)    All other components within normal limits  COMPREHENSIVE METABOLIC PANEL - Abnormal; Notable for the following components:   Glucose, Bld 101 (*)    Creatinine, Ser 1.16 (*)    GFR calc non Af Amer 46 (*)    GFR calc  Af Amer 53 (*)    All other components within normal limits  URINALYSIS, ROUTINE W REFLEX MICROSCOPIC - Abnormal; Notable for the following components:   APPearance CLOUDY (*)    Hgb urine dipstick SMALL (*)    Protein, ur 100 (*)    Nitrite POSITIVE (*)    Leukocytes,Ua MODERATE (*)    WBC, UA >50 (*)    Bacteria, UA MANY (*)    All other components within normal limits  URINE CULTURE    EKG None  Radiology No results found.  Procedures Procedures (including critical care time)  Medications Ordered in ED Medications  cefTRIAXone (ROCEPHIN) 1 g in sodium chloride 0.9 % 100 mL IVPB (0 g Intravenous Stopped 12/14/19 1253)    ED Course  I have reviewed the triage vital signs and the nursing notes.  Pertinent labs & imaging results that were available during my care of the patient were reviewed by me and considered in my medical decision making (see chart for details).    MDM Rules/Calculators/A&P                      Patient has urinary tract infection.  She will be treated with Keflex and will follow up with her PCP.  Culture pending Final Clinical Impression(s) / ED Diagnoses Final diagnoses:  Cystitis    Rx / DC Orders ED Discharge Orders         Ordered    cephALEXin (KEFLEX) 500 MG capsule  4 times daily     12/14/19 1256           Bethann Berkshire, MD 12/14/19 1301

## 2019-12-14 NOTE — Clinical Social Work Note (Signed)
Spoke with cousin, Faith Little, who was with patient in ED. Patient lives with her son. Son works during day. Faith and another cousin stay with patient during the day as much as they can.  Discussed contacting Elder Law or local attorney to discuss protecting assets.  Alfredo Batty has started Medicaid process. Patient's income is too high.  Discussed that if she went in to long term care she may qualify for LTC medicaid.  Discussed that patient would need a memory care unit due to a recent episode of getting up at 3:00 a.m., getting dressed and preparing to leave for church. Patient will start going to [facility] next week from 8-4.  Discussed HH and Faith determined it was not needed at this time. Discussed that current insurance would only pay for short term rehab.   TOC signing off.    Thai Burgueno, Juleen China, LCSW

## 2019-12-16 LAB — URINE CULTURE: Culture: 90000 — AB

## 2019-12-17 ENCOUNTER — Telehealth: Payer: Self-pay

## 2019-12-17 NOTE — Telephone Encounter (Signed)
Post ED Visit - Positive Culture Follow-up  Culture report reviewed by antimicrobial stewardship pharmacist: [facility] []  , Pharm.D. []  Enzo Bi, Pharm.D., BCPS AQ-ID []  , Pharm.D., BCPS []  Celedonio Miyamoto, Pharm.D., BCPS []  [place], Garvin Fila.D., BCPS, AAHIVP []  , Pharm.D., BCPS, AAHIVP []  Georgina Pillion, PharmD, BCPS []  , PharmD, BCPS []  [place], PharmD, BCPS []  [address], PharmD []  , PharmD, BCPS []  Estella Husk, PharmD [facility] []  Lysle Pearl, PharmD []  , PharmD []  Phillips Climes, PharmD []  , Rph []  Agapito Games) , PharmD []  Verlan Friends, PharmD []  , PharmD []  Mervyn Gay, PharmD []  , PharmD []  Vinnie Level, PharmD []  Bufford Lope, PharmD []  , PharmD []  Len Childs, PharmD   Positive urine culture Treated with Cephalexin, organism sensitive to the same and no further patient follow-up is required at this time.  12/17/2019, 9:31 AM

## 2019-12-31 ENCOUNTER — Ambulatory Visit: Payer: Medicare HMO | Admitting: Orthopedic Surgery

## 2020-01-05 ENCOUNTER — Encounter: Payer: Self-pay | Admitting: Neurology

## 2020-01-05 ENCOUNTER — Other Ambulatory Visit: Payer: Self-pay

## 2020-01-05 ENCOUNTER — Ambulatory Visit: Payer: Medicare HMO | Admitting: Diagnostic Neuroimaging

## 2020-01-05 ENCOUNTER — Ambulatory Visit: Payer: Medicare HMO | Admitting: Neurology

## 2020-01-05 ENCOUNTER — Telehealth: Payer: Self-pay | Admitting: *Deleted

## 2020-01-05 VITALS — Temp 97.7°F | Ht 66.0 in | Wt 174.0 lb

## 2020-01-05 DIAGNOSIS — F0391 Unspecified dementia with behavioral disturbance: Secondary | ICD-10-CM | POA: Diagnosis not present

## 2020-01-05 DIAGNOSIS — E538 Deficiency of other specified B group vitamins: Secondary | ICD-10-CM | POA: Diagnosis not present

## 2020-01-05 MED ORDER — QUETIAPINE FUMARATE 25 MG PO TABS: 25.0000 mg | ORAL_TABLET | Freq: Every day | ORAL | 11 refills | Status: DC

## 2020-01-05 NOTE — Telephone Encounter (Signed)
PA approved. Case [id number]. Valid through 10/22/20.

## 2020-01-05 NOTE — Telephone Encounter (Signed)
PA for quetiapine 25mg , one tab QHS started on covermymeds.com (key ). Pt has pharmacy coverage w/ [employer] [phone]). Decision pending.
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
## 2020-01-06 ENCOUNTER — Telehealth: Payer: Self-pay | Admitting: Neurology

## 2020-01-06 DIAGNOSIS — E538 Deficiency of other specified B group vitamins: Secondary | ICD-10-CM

## 2020-01-06 LAB — VITAMIN B12: Vitamin B-12: 219 pg/mL — ABNORMAL LOW (ref 232–1245)

## 2020-01-06 NOTE — Telephone Encounter (Signed)
Called and spoke with Doris Riley (on DPR). Relayed Dr. Zannie Cove message. She is going to try and get labs done at [place]. She will call back if orders need to be faxed to her. Her fax: [phone].

## 2020-01-06 NOTE — Telephone Encounter (Signed)
Please call patient, laboratory evaluation showed vitamin B12 deficiency, 218,  She may come in for repeat laboratory, I have put in order, may also begin B12 supplement according to protocol aft blood draw

## 2020-01-09 ENCOUNTER — Other Ambulatory Visit (HOSPITAL_COMMUNITY)
Admission: RE | Admit: 2020-01-09 | Discharge: 2020-01-09 | Disposition: A | Discharge status: Home / Self Care | Payer: Medicare HMO | Source: Ambulatory Visit | Attending: Neurology | Admitting: Neurology

## 2020-01-09 ENCOUNTER — Other Ambulatory Visit: Payer: Self-pay

## 2020-01-09 DIAGNOSIS — E538 Deficiency of other specified B group vitamins: Secondary | ICD-10-CM | POA: Diagnosis not present

## 2020-01-09 LAB — FOLATE: Folate: 8.7 ng/mL (ref >5.9)

## 2020-01-09 LAB — VITAMIN B12: Vitamin B-12: 130 pg/mL — ABNORMAL LOW (ref 180–914)

## 2020-01-12 ENCOUNTER — Telehealth: Payer: Self-pay | Admitting: Neurology

## 2020-01-12 LAB — HOMOCYSTEINE: Homocysteine: 23.1 umol/L — ABNORMAL HIGH (ref 0.0–19.2)

## 2020-01-12 NOTE — Telephone Encounter (Signed)
Please call patient, repeat laboratory evaluation continues to show evidence of vitamin B12 deficiency, B12 level was 130, she should start B12 supplement IM, 1000 mcg daily for 1 week, then weekly for 1 month, then monthly

## 2020-01-12 NOTE — Telephone Encounter (Signed)
I spoke to her cousin, Jodelle Red, who provides care to this patient. She verbalized understanding of the B12 results. She is in agreement to starting an injectable supplement. She plans to discuss this with her PCP, so monitoring and treatment can be provided at that office. Labs results have been faxed to Dr. Misty Stanley Corum's attention for her review.

## 2020-01-13 ENCOUNTER — Ambulatory Visit: Payer: Medicare Other | Admitting: Family Medicine

## 2020-01-13 ENCOUNTER — Encounter (HOSPITAL_COMMUNITY): Payer: Self-pay

## 2020-01-13 ENCOUNTER — Emergency Department (HOSPITAL_COMMUNITY)
Admission: EM | Admit: 2020-01-13 | Discharge: 2020-01-13 | Disposition: A | Discharge status: Home / Self Care | Payer: Medicare HMO | Attending: Emergency Medicine | Admitting: Emergency Medicine

## 2020-01-13 ENCOUNTER — Other Ambulatory Visit: Payer: Self-pay

## 2020-01-13 ENCOUNTER — Emergency Department (HOSPITAL_COMMUNITY): Payer: Medicare HMO

## 2020-01-13 DIAGNOSIS — Z79899 Other long term (current) drug therapy: Secondary | ICD-10-CM | POA: Insufficient documentation

## 2020-01-13 DIAGNOSIS — I10 Essential (primary) hypertension: Secondary | ICD-10-CM | POA: Insufficient documentation

## 2020-01-13 DIAGNOSIS — F0391 Unspecified dementia with behavioral disturbance: Secondary | ICD-10-CM | POA: Diagnosis not present

## 2020-01-13 DIAGNOSIS — F329 Major depressive disorder, single episode, unspecified: Secondary | ICD-10-CM | POA: Diagnosis not present

## 2020-01-13 DIAGNOSIS — Z96652 Presence of left artificial knee joint: Secondary | ICD-10-CM | POA: Insufficient documentation

## 2020-01-13 DIAGNOSIS — R4182 Altered mental status, unspecified: Secondary | ICD-10-CM | POA: Diagnosis not present

## 2020-01-13 DIAGNOSIS — F0390 Unspecified dementia without behavioral disturbance: Secondary | ICD-10-CM | POA: Diagnosis not present

## 2020-01-13 LAB — CBC WITH DIFFERENTIAL/PLATELET
Abs Immature Granulocytes: 0.01 10*3/uL (ref 0.00–0.07)
Basophils Absolute: 0.1 10*3/uL (ref 0.0–0.1)
Basophils Relative: 1 %
Eosinophils Absolute: 0.2 10*3/uL (ref 0.0–0.5)
Eosinophils Relative: 3 %
HCT: 39.6 % (ref 36.0–46.0)
Hemoglobin: 11 g/dL — ABNORMAL LOW (ref 12.0–15.0)
Immature Granulocytes: 0 %
Lymphocytes Relative: 18 %
Lymphs Abs: 1 10*3/uL (ref 0.7–4.0)
MCH: 27.4 pg (ref 26.0–34.0)
MCHC: 27.8 g/dL — ABNORMAL LOW (ref 30.0–36.0)
MCV: 98.5 fL (ref 80.0–100.0)
Monocytes Absolute: 0.7 10*3/uL (ref 0.1–1.0)
Monocytes Relative: 13 %
Neutro Abs: 3.4 10*3/uL (ref 1.7–7.7)
Neutrophils Relative %: 65 %
Platelets: 260 10*3/uL (ref 150–400)
RBC: 4.02 MIL/uL (ref 3.87–5.11)
RDW: 13.8 % (ref 11.5–15.5)
WBC: 5.3 10*3/uL (ref 4.0–10.5)
nRBC: 0 % (ref 0.0–0.2)

## 2020-01-13 LAB — BASIC METABOLIC PANEL
Anion gap: 10 (ref 5–15)
BUN: 19 mg/dL (ref 8–23)
CO2: 25 mmol/L (ref 22–32)
Calcium: 9.2 mg/dL (ref 8.9–10.3)
Chloride: 106 mmol/L (ref 98–111)
Creatinine, Ser: 1.08 mg/dL — ABNORMAL HIGH (ref 0.44–1.00)
GFR calc Af Amer: 58 mL/min — ABNORMAL LOW (ref >60)
GFR calc non Af Amer: 50 mL/min — ABNORMAL LOW (ref >60)
Glucose, Bld: 95 mg/dL (ref 70–99)
Potassium: 3.6 mmol/L (ref 3.5–5.1)
Sodium: 141 mmol/L (ref 135–145)

## 2020-01-13 LAB — URINALYSIS, ROUTINE W REFLEX MICROSCOPIC
Bacteria, UA: NONE SEEN
Bilirubin Urine: NEGATIVE
Glucose, UA: NEGATIVE mg/dL
Hgb urine dipstick: NEGATIVE
Ketones, ur: NEGATIVE mg/dL
Nitrite: NEGATIVE
Protein, ur: NEGATIVE mg/dL
Specific Gravity, Urine: 1.019 (ref 1.005–1.030)
pH: 5 (ref 5.0–8.0)

## 2020-01-13 LAB — AMMONIA: Ammonia: 17 umol/L (ref 9–35)

## 2020-01-13 LAB — TSH: TSH: 1.045 u[IU]/mL (ref 0.350–4.500)

## 2020-01-13 NOTE — ED Provider Notes (Signed)
[facility] EMERGENCY DEPARTMENT Provider Note   CSN: [id number] Arrival date & time: 01/13/20  7902     History Chief Complaint  Patient presents with  . V70.1    Doris Riley is a 79 y.o. female.  She has a history of depression dementia.  She is brought in by her family member who is providing most of the history.  Level 5 caveat secondary to dementia.  She has had progressively increasing auditory and visual hallucinations and forgetfulness.  Now is wandering away from house.  Saw neurology about a week ago and they added Seroquel at nighttime to help with her sundowning.  Does not seem to be helping.  Family is hoping that she can be placed in a memory care unit as they cannot provide 24/7 supervision.  Patient herself only complains of some right knee pain from arthritis that appears chronic.  The history is provided by the patient and a relative.  Altered Mental Status Presenting symptoms: behavior changes, confusion and disorientation   Severity:  Moderate Most recent episode:  Today Episode history:  Continuous Timing:  Constant Progression:  Worsening Chronicity:  New Context: dementia and recent infection   Associated symptoms: hallucinations   Associated symptoms: no abdominal pain, no fever, no headaches, no nausea, no rash, no suicidal behavior and no vomiting        Past Medical History:  Diagnosis Date  . Anemia    mild  . Arthritis   . Constipation   . Dementia ([place])   . Depression   . Hyperlipidemia   . Hypertension     Patient Active Problem List   Diagnosis Date Noted  . Vitamin B 12 deficiency 01/06/2020  . Elevated glucose 12/11/2019  . Need for vaccination for Strep pneumoniae 12/11/2019  . Screening examination for infectious disease 12/11/2019  . Dementia with behavioral disturbance ([place]) 12/01/2019  . Chronic pain of right knee 12/01/2019  . Essential hypertension 12/01/2019  . Hyperlipidemia 12/01/2019  . Arthritis of knee,  degenerative 04/28/2015  . S/P total knee replacement, left 04/28/15   . Tibial plateau fracture   . Bloating 10/26/2011  . Constipation 08/07/2011  . Family history of colon cancer 08/07/2011  . Elevated alkaline phosphatase level 08/07/2011  . Anemia 08/07/2011    Past Surgical History:  Procedure Laterality Date  . ABDOMINAL HYSTERECTOMY    . COLONOSCOPY     >10 years ago  . COLONOSCOPY  09/01/2011   Procedure: COLONOSCOPY;  Surgeon: Dorothyann Peng, MD;  Location: [facility]ITE;  Service: Endoscopy;  Laterality: N/A;  10:30  . HARDWARE REMOVAL Left 04/28/2015   Procedure: HARDWARE REMOVAL LEFT KNEE;  Surgeon: Carole Civil, MD;  Location: [facility] ORS;  Service: Orthopedics;  Laterality: Left;  . KNEE SURGERY     left, fracture  . S/P Hysterectomy     partial  . TOTAL KNEE ARTHROPLASTY Left 04/28/2015   Procedure:  LEFT TOTAL KNEE ARTHROPLASTY;  Surgeon: Carole Civil, MD;  Location: [facility] ORS;  Service: Orthopedics;  Laterality: Left;  depuy implant     OB History   No obstetric history on file.     Family History  Problem Relation Age of Onset  . Colon cancer Mother        72s  . Prostate cancer Father        16s  . GI problems Brother        colostomy, patient not sure why  . Colon cancer Brother   .  Prostate cancer Brother     Social History   Tobacco Use  . Smoking status: Never Smoker  . Smokeless tobacco: Never Used  Substance Use Topics  . Alcohol use: No  . Drug use: No    Home Medications Prior to Admission medications   Medication Sig Start Date End Date Taking? Authorizing Provider  amLODipine-benazepril (LOTREL) 10-40 MG capsule Take 1 capsule by mouth daily. 12/01/19   Corum, Minerva Fester, MD  atorvastatin (LIPITOR) 10 MG tablet Take 1 tablet (10 mg total) by mouth daily. 12/01/19   Corum, Minerva Fester, MD  cephALEXin (KEFLEX) 500 MG capsule Take 1 capsule (500 mg total) by mouth 4 (four) times daily. 12/14/19   Bethann Berkshire, MD  donepezil (ARICEPT) 10 MG  tablet Take 1 tablet (10 mg total) by mouth at bedtime. 12/01/19   Corum, Minerva Fester, MD  escitalopram (LEXAPRO) 10 MG tablet Take 1 tablet (10 mg total) by mouth 1 day or 1 dose. 12/01/19   Corum, Minerva Fester, MD  memantine (NAMENDA) 10 MG tablet Take 1 tablet (10 mg total) by mouth 2 (two) times daily. 12/01/19   Corum, Minerva Fester, MD  QUEtiapine (SEROQUEL) 25 MG tablet Take 1 tablet (25 mg total) by mouth at bedtime. 01/05/20   Levert Feinstein, MD    Allergies    Patient has no known allergies.  Review of Systems   Review of Systems  Unable to perform ROS: Dementia  Constitutional: Negative for fever.  HENT: Negative for sore throat.   Eyes: Negative for visual disturbance.  Respiratory: Negative for shortness of breath.   Cardiovascular: Negative for chest pain.  Gastrointestinal: Negative for abdominal pain, nausea and vomiting.  Genitourinary: Negative for dysuria.  Musculoskeletal: Positive for gait problem (r knee arthritis).  Skin: Negative for rash.  Neurological: Negative for headaches.  Psychiatric/Behavioral: Positive for confusion and hallucinations.    Physical Exam Updated Vital Signs BP 137/65 (BP Location: Left Arm)   Pulse 76   Temp 98.5 F (36.9 C) (Oral)   Resp 20   Ht 5\' 6"  (1.676 m)   Wt 78.9 kg   SpO2 100%   BMI 28.08 kg/m   Physical Exam Vitals and nursing note reviewed.  Constitutional:      General: She is not in acute distress.    Appearance: She is well-developed.  HENT:     Head: Normocephalic and atraumatic.  Eyes:     Conjunctiva/sclera: Conjunctivae normal.  Cardiovascular:     Rate and Rhythm: Normal rate and regular rhythm.     Heart sounds: No murmur.  Pulmonary:     Effort: Pulmonary effort is normal. No respiratory distress.     Breath sounds: Normal breath sounds.  Abdominal:     Palpations: Abdomen is soft.     Tenderness: There is no abdominal tenderness.  Musculoskeletal:        General: Normal range of motion.     Cervical back: Neck supple.   Skin:    General: Skin is warm and dry.     Capillary Refill: Capillary refill takes less than 2 seconds.  Neurological:     Mental Status: She is alert. Mental status is at baseline.     Cranial Nerves: No cranial nerve deficit.     Sensory: No sensory deficit.     Motor: No weakness.     Comments: Patient is alert and oriented x2.  She is knows she is in the hospital but does not know the name.  She thinks it is November or December.     ED Results / Procedures / Treatments   Labs (all labs ordered are listed, but only abnormal results are displayed) Labs Reviewed  CBC WITH DIFFERENTIAL/PLATELET - Abnormal; Notable for the following components:      Result Value   Hemoglobin 11.0 (*)    MCHC 27.8 (*)    All other components within normal limits  BASIC METABOLIC PANEL - Abnormal; Notable for the following components:   Creatinine, Ser 1.08 (*)    GFR calc non Af Amer 50 (*)    GFR calc Af Amer 58 (*)    All other components within normal limits  URINALYSIS, ROUTINE W REFLEX MICROSCOPIC - Abnormal; Notable for the following components:   APPearance HAZY (*)    Leukocytes,Ua TRACE (*)    All other components within normal limits  AMMONIA  TSH    EKG EKG Interpretation  Date/Time:  Tuesday January 13 2020 07:29:17 EDT Ventricular Rate:  68 PR Interval:    QRS Duration: 147 QT Interval:  454 QTC Calculation: 483 R Axis:   70 Text Interpretation: Sinus rhythm IVCD, consider atypical LBBB No significant change since 6/12 Confirmed by Meridee Score [phone]) on 01/13/2020 7:47:17 AM   Radiology CT Head Wo Contrast  Result Date: 01/13/2020 CLINICAL DATA:  Altered mental status. EXAM: CT HEAD WITHOUT CONTRAST TECHNIQUE: Contiguous axial images were obtained from the base of the skull through the vertex without intravenous contrast. COMPARISON:  July 15, 2013. FINDINGS: Brain: No evidence of acute infarction, hemorrhage, hydrocephalus, extra-axial collection or mass  lesion/mass effect. Vascular: No hyperdense vessel or unexpected calcification. Skull: Normal. Negative for fracture or focal lesion. Sinuses/Orbits: No acute finding. Other: None. IMPRESSION: Normal head CT. Electronically Signed   By: Lupita Raider M.D.   On: 01/13/2020 08:36    Procedures Procedures (including critical care time)  Medications Ordered in ED Medications - No data to display  ED Course  I have reviewed the triage vital signs and the nursing notes.  Pertinent labs & imaging results that were available during my care of the patient were reviewed by me and considered in my medical decision making (see chart for details).  Clinical Course as of Jan 13 1807  Tue Jan 13, 2020  0745 Patient here with likely progressive dementia.  We will check a head CT to rule out any structural disease and had an MRI 2 years ago.  Check a urine to make sure is not a UTI.  Checking some basic labs to look for metabolic derangement.  Social work to see what her options are for placement.   [MB]  [id number] Reviewed prior notes.  She had seen neurology just about a week ago and they added Seroquel for her nighttime agitation.  They also noted her B12 to be low and recommended her getting IM medication   [MB]  0844 Patient's urine showing resolution of her UTI.  Lab work otherwise fairly unremarkable.  CT head showing no acute findings.   [MB]  [phone] Updated family and patient on her results.  Discussed with social work who will reach out to patient and family regarding options.   [MB]    Clinical Course User Index [MB] Terrilee Files, MD   MDM Rules/Calculators/A&P                       Final Clinical Impression(s) / ED Diagnoses Final diagnoses:  Dementia with behavioral disturbance,  unspecified dementia type [facility])    Rx / DC Orders ED Discharge Orders    None       Terrilee Files, MD 01/13/20 [phone]

## 2020-01-13 NOTE — Progress Notes (Signed)
Consult request has been received. CSW attempting to follow up at present time  Laniqua Torrens M. Bryant Saye LCSWA Transitions of Care  Clinical Social Worker  Ph: [phone] 

## 2020-01-13 NOTE — Progress Notes (Signed)
CSW in contact with Faith who reports that she has submitted medicaid application for patient. Medicaid worked at [employer] informed family that they can begin to seek placement and to also have an FL2 submitted.  Family was informed that patient is ready for discharge. Family was reassured to know that they can begin to seek placement for patient. CSW advised patient to inquire about FL2 from PCP since patient is being discharged.   Family is compliant and receptive to taking patient back home  No further needs at this time  Burle Kwan Tomma Rakers Transitions of Care  Clinical Social Worker  Ph: [phone]

## 2020-01-13 NOTE — TOC Initial Note (Signed)
Transition of Care [facility]) - Initial/Assessment Note    Patient Details  Name: Doris Riley MRN: [id number] Date of Birth: 08/10/1941  Transition of Care [facility]) CM/SW Contact:    Sanaiya Welliver Sherryle Lis, LCSW Phone Number: 01/13/2020, 11:07 AM  Clinical Narrative:   CSW at bedside to greet patient and family. Family preferred that the conversation be had not in front of the patient has it pertained to long term care placement. Patient has a diagnosis of dementia which has progressively gotten worse. Those present during the meeting consisted of patients two cousins, Rushie Goltz and Consuella Lose. CSW actively listened as family described patients home life as it pertained to her lack of supervision. Faith explains that although patient resides at home with his son, he works 12 hour shifts and patient is often left at home alone without supervision. Patients dementia has become worse and has escalated into hallucinations and wandering. Family reports that patient has been noted wandering up the street with her walker and several clothing items.   Patients cousins Consuella Lose and Rushie Goltz explain that because of work and other family related obligations they do not have the time to assist with supervising patient. Patient attends an adult [facility]ocational center for [facility] called [employer] on Mondays-Friday from 10am-3pm. Family states that based on patients income, she can not afford a private pay nurse aid to assist with supervision needs.   Patient ambulates with a walker after having a fall 1 month ago.   CSW informed family that barriers to placing patient would be lack of medicaid insurance and/or funds to afford her stay privately. CSW validates family's concern for patient discharging back to an unsafe home situation. CSW advised family to present to their local DSS and apply for Long term care medicaid. CSW informed family that if they refuse to pick patient up from ED after being discharged due to them feeling its unsafe for  her to remain home, CSW can contact APS to use as an additional resource.   TOC team will continue to follow patient for discharge related needs  Shylah Dossantos Tomma Rakers Transitions of Care  Clinical Social Worker  Ph: [phone]          Expected Discharge Plan: Long Term Nursing Home Barriers to Discharge: Unsafe home situation, ED Patient/Family Requesting Placement but Has No Payor   Patient Goals and CMS Choice Patient states their goals for this hospitalization and ongoing recovery are:: to be placed into long term care      Expected Discharge Plan and Services Expected Discharge Plan: Long Term Nursing Home In-house Referral: Clinical Social Work, Conservator, museum/gallery Services: CM Consult Post Acute Care Choice: Nursing Home Living arrangements for the past 2 months: Single Family Home                                      Prior Living Arrangements/Services Living arrangements for the past 2 months: Single Family Home Lives with:: Adult Children Patient language and need for interpreter reviewed:: Yes Do you feel safe going back to the place where you live?: No   family states that patient has no 24 hr supervision  Need for Family Participation in Patient Care: Yes (Comment) Care giver support system in place?: No (comment) Current home services: DME Criminal Activity/Legal Involvement Pertinent to Current Situation/Hospitalization: No - Comment as needed  Activities of Daily Living      Permission  Sought/Granted Permission sought to share information with : Case Manager Permission granted to share information with : Yes, Release of Information Signed  Share Information with NAME: Faith Little  Permission granted to share info w AGENCY: [facility]  Permission granted to share info w Relationship: cousin  Permission granted to share info w Contact Information: [phone]  Emotional Assessment Appearance:: Appears stated  age Attitude/Demeanor/Rapport: Engaged Affect (typically observed): Stable, Happy, Pleasant Orientation: : Oriented to Self Alcohol / Substance Use: Not Applicable Psych Involvement: No (comment)  Admission diagnosis:  EVALUATION Patient Active Problem List   Diagnosis Date Noted  . Vitamin B 12 deficiency 01/06/2020  . Elevated glucose 12/11/2019  . Need for vaccination for Strep pneumoniae 12/11/2019  . Screening examination for infectious disease 12/11/2019  . Dementia with behavioral disturbance ([place]) 12/01/2019  . Chronic pain of right knee 12/01/2019  . Essential hypertension 12/01/2019  . Hyperlipidemia 12/01/2019  . Arthritis of knee, degenerative 04/28/2015  . S/P total knee replacement, left 04/28/15   . Tibial plateau fracture   . Bloating 10/26/2011  . Constipation 08/07/2011  . Family history of colon cancer 08/07/2011  . Elevated alkaline phosphatase level 08/07/2011  . Anemia 08/07/2011   PCP:  Maryruth Hancock, MD Pharmacy:   [facility], [address] [place] [address] Phone: [phone] Fax: [phone]     Social Determinants of Health (SDOH) Interventions    Readmission Risk Interventions No flowsheet data found.

## 2020-01-13 NOTE — ED Triage Notes (Signed)
Family reports pt has dementia and has been getting worse over the past year.  Reports pt having auditory and visual hallucinations, wandering, forgetfulness.  Family says pcp recommended placement for pt.  Family says pt no longer know how to cook for herself or wash clothes.  Reports pt stays up all night.  Reports confusion worse at night.

## 2020-01-13 NOTE — Discharge Instructions (Addendum)
You were seen in the emergency department for worsening dementia symptoms.  You had blood work urinalysis and a CAT scan that did not show any serious findings.  Social work is involved and is setting you up with some home services.  It will be important that you continue to pursue Medicaid as a possibility for getting some long-term care.  Please return to the emergency department if any acute worsening symptoms

## 2020-01-14 ENCOUNTER — Ambulatory Visit (INDEPENDENT_AMBULATORY_CARE_PROVIDER_SITE_OTHER): Payer: Medicare HMO | Admitting: Family Medicine

## 2020-01-14 DIAGNOSIS — E538 Deficiency of other specified B group vitamins: Secondary | ICD-10-CM

## 2020-01-14 MED ORDER — CYANOCOBALAMIN 1000 MCG/ML IJ SOLN
1000.0000 ug | Freq: Once | INTRAMUSCULAR | Status: AC
  Administered 2020-01-14 – 2020-01-29 (×3): 1000 ug via INTRAMUSCULAR

## 2020-01-16 LAB — METHYLMALONIC ACID, SERUM: Methylmalonic Acid, Quantitative: 374 nmol/L (ref 0–378)

## 2020-01-20 ENCOUNTER — Telehealth: Payer: Self-pay | Admitting: Emergency Medicine

## 2020-01-20 NOTE — Telephone Encounter (Signed)
Left a msg for the care giver to give [place] a call so they can go through the medicaid form with me before we are able to have form completed

## 2020-01-22 ENCOUNTER — Other Ambulatory Visit: Payer: Self-pay

## 2020-01-22 ENCOUNTER — Ambulatory Visit (INDEPENDENT_AMBULATORY_CARE_PROVIDER_SITE_OTHER): Payer: Medicare HMO | Admitting: Family Medicine

## 2020-01-22 ENCOUNTER — Ambulatory Visit: Payer: Medicare HMO

## 2020-01-22 DIAGNOSIS — E538 Deficiency of other specified B group vitamins: Secondary | ICD-10-CM

## 2020-01-29 ENCOUNTER — Other Ambulatory Visit: Payer: Self-pay

## 2020-01-29 ENCOUNTER — Ambulatory Visit (INDEPENDENT_AMBULATORY_CARE_PROVIDER_SITE_OTHER): Payer: Medicare HMO | Admitting: Family Medicine

## 2020-01-29 ENCOUNTER — Ambulatory Visit: Payer: Medicare HMO

## 2020-01-29 DIAGNOSIS — E538 Deficiency of other specified B group vitamins: Secondary | ICD-10-CM

## 2020-02-05 ENCOUNTER — Other Ambulatory Visit: Payer: Self-pay

## 2020-02-05 ENCOUNTER — Ambulatory Visit (INDEPENDENT_AMBULATORY_CARE_PROVIDER_SITE_OTHER): Payer: Medicare HMO | Admitting: Family Medicine

## 2020-02-05 DIAGNOSIS — E538 Deficiency of other specified B group vitamins: Secondary | ICD-10-CM | POA: Diagnosis not present

## 2020-02-05 MED ORDER — CYANOCOBALAMIN 1000 MCG/ML IJ SOLN
1000.0000 ug | Freq: Once | INTRAMUSCULAR | Status: AC
  Administered 2020-02-05: 1000 ug via INTRAMUSCULAR

## 2020-02-17 ENCOUNTER — Other Ambulatory Visit: Payer: Self-pay | Admitting: Family Medicine

## 2020-02-20 DIAGNOSIS — I10 Essential (primary) hypertension: Secondary | ICD-10-CM | POA: Diagnosis not present

## 2020-02-20 DIAGNOSIS — F329 Major depressive disorder, single episode, unspecified: Secondary | ICD-10-CM | POA: Diagnosis not present

## 2020-02-20 DIAGNOSIS — E785 Hyperlipidemia, unspecified: Secondary | ICD-10-CM | POA: Diagnosis not present

## 2020-02-20 DIAGNOSIS — F0391 Unspecified dementia with behavioral disturbance: Secondary | ICD-10-CM | POA: Diagnosis not present

## 2020-02-22 DIAGNOSIS — R2681 Unsteadiness on feet: Secondary | ICD-10-CM | POA: Diagnosis not present

## 2020-02-22 DIAGNOSIS — M1711 Unilateral primary osteoarthritis, right knee: Secondary | ICD-10-CM | POA: Diagnosis not present

## 2020-02-22 DIAGNOSIS — R41841 Cognitive communication deficit: Secondary | ICD-10-CM | POA: Diagnosis not present

## 2020-02-22 DIAGNOSIS — M6281 Muscle weakness (generalized): Secondary | ICD-10-CM | POA: Diagnosis not present

## 2020-02-22 DIAGNOSIS — F0391 Unspecified dementia with behavioral disturbance: Secondary | ICD-10-CM | POA: Diagnosis not present

## 2020-02-23 DIAGNOSIS — R2681 Unsteadiness on feet: Secondary | ICD-10-CM | POA: Diagnosis not present

## 2020-02-23 DIAGNOSIS — D649 Anemia, unspecified: Secondary | ICD-10-CM | POA: Diagnosis not present

## 2020-02-23 DIAGNOSIS — R41841 Cognitive communication deficit: Secondary | ICD-10-CM | POA: Diagnosis not present

## 2020-02-23 DIAGNOSIS — M1711 Unilateral primary osteoarthritis, right knee: Secondary | ICD-10-CM | POA: Diagnosis not present

## 2020-02-23 DIAGNOSIS — D519 Vitamin B12 deficiency anemia, unspecified: Secondary | ICD-10-CM | POA: Diagnosis not present

## 2020-02-23 DIAGNOSIS — F0391 Unspecified dementia with behavioral disturbance: Secondary | ICD-10-CM | POA: Diagnosis not present

## 2020-02-23 DIAGNOSIS — M6281 Muscle weakness (generalized): Secondary | ICD-10-CM | POA: Diagnosis not present

## 2020-02-23 DIAGNOSIS — E1100 Type 2 diabetes mellitus with hyperosmolarity without nonketotic hyperglycemic-hyperosmolar coma (NKHHC): Secondary | ICD-10-CM | POA: Diagnosis not present

## 2020-02-24 DIAGNOSIS — M1711 Unilateral primary osteoarthritis, right knee: Secondary | ICD-10-CM | POA: Diagnosis not present

## 2020-02-24 DIAGNOSIS — R2681 Unsteadiness on feet: Secondary | ICD-10-CM | POA: Diagnosis not present

## 2020-02-24 DIAGNOSIS — F0391 Unspecified dementia with behavioral disturbance: Secondary | ICD-10-CM | POA: Diagnosis not present

## 2020-02-24 DIAGNOSIS — E785 Hyperlipidemia, unspecified: Secondary | ICD-10-CM | POA: Diagnosis not present

## 2020-02-24 DIAGNOSIS — M6281 Muscle weakness (generalized): Secondary | ICD-10-CM | POA: Diagnosis not present

## 2020-02-24 DIAGNOSIS — R41841 Cognitive communication deficit: Secondary | ICD-10-CM | POA: Diagnosis not present

## 2020-02-24 DIAGNOSIS — F0390 Unspecified dementia without behavioral disturbance: Secondary | ICD-10-CM | POA: Diagnosis not present

## 2020-02-24 DIAGNOSIS — F329 Major depressive disorder, single episode, unspecified: Secondary | ICD-10-CM | POA: Diagnosis not present

## 2020-02-24 DIAGNOSIS — I10 Essential (primary) hypertension: Secondary | ICD-10-CM | POA: Diagnosis not present

## 2020-02-25 DIAGNOSIS — M6281 Muscle weakness (generalized): Secondary | ICD-10-CM | POA: Diagnosis not present

## 2020-02-25 DIAGNOSIS — M1711 Unilateral primary osteoarthritis, right knee: Secondary | ICD-10-CM | POA: Diagnosis not present

## 2020-02-25 DIAGNOSIS — R41841 Cognitive communication deficit: Secondary | ICD-10-CM | POA: Diagnosis not present

## 2020-02-25 DIAGNOSIS — R2681 Unsteadiness on feet: Secondary | ICD-10-CM | POA: Diagnosis not present

## 2020-02-25 DIAGNOSIS — F0391 Unspecified dementia with behavioral disturbance: Secondary | ICD-10-CM | POA: Diagnosis not present

## 2020-02-26 DIAGNOSIS — M6281 Muscle weakness (generalized): Secondary | ICD-10-CM | POA: Diagnosis not present

## 2020-02-26 DIAGNOSIS — R41841 Cognitive communication deficit: Secondary | ICD-10-CM | POA: Diagnosis not present

## 2020-02-26 DIAGNOSIS — R2681 Unsteadiness on feet: Secondary | ICD-10-CM | POA: Diagnosis not present

## 2020-02-26 DIAGNOSIS — M1711 Unilateral primary osteoarthritis, right knee: Secondary | ICD-10-CM | POA: Diagnosis not present

## 2020-02-26 DIAGNOSIS — F0391 Unspecified dementia with behavioral disturbance: Secondary | ICD-10-CM | POA: Diagnosis not present

## 2020-02-27 DIAGNOSIS — R41841 Cognitive communication deficit: Secondary | ICD-10-CM | POA: Diagnosis not present

## 2020-02-27 DIAGNOSIS — R2681 Unsteadiness on feet: Secondary | ICD-10-CM | POA: Diagnosis not present

## 2020-02-27 DIAGNOSIS — F0391 Unspecified dementia with behavioral disturbance: Secondary | ICD-10-CM | POA: Diagnosis not present

## 2020-02-27 DIAGNOSIS — M1711 Unilateral primary osteoarthritis, right knee: Secondary | ICD-10-CM | POA: Diagnosis not present

## 2020-02-27 DIAGNOSIS — M6281 Muscle weakness (generalized): Secondary | ICD-10-CM | POA: Diagnosis not present

## 2020-03-01 DIAGNOSIS — R41841 Cognitive communication deficit: Secondary | ICD-10-CM | POA: Diagnosis not present

## 2020-03-01 DIAGNOSIS — F0391 Unspecified dementia with behavioral disturbance: Secondary | ICD-10-CM | POA: Diagnosis not present

## 2020-03-01 DIAGNOSIS — M1711 Unilateral primary osteoarthritis, right knee: Secondary | ICD-10-CM | POA: Diagnosis not present

## 2020-03-01 DIAGNOSIS — M6281 Muscle weakness (generalized): Secondary | ICD-10-CM | POA: Diagnosis not present

## 2020-03-01 DIAGNOSIS — R2681 Unsteadiness on feet: Secondary | ICD-10-CM | POA: Diagnosis not present

## 2020-03-02 DIAGNOSIS — R41841 Cognitive communication deficit: Secondary | ICD-10-CM | POA: Diagnosis not present

## 2020-03-02 DIAGNOSIS — R2681 Unsteadiness on feet: Secondary | ICD-10-CM | POA: Diagnosis not present

## 2020-03-02 DIAGNOSIS — F0391 Unspecified dementia with behavioral disturbance: Secondary | ICD-10-CM | POA: Diagnosis not present

## 2020-03-02 DIAGNOSIS — M1711 Unilateral primary osteoarthritis, right knee: Secondary | ICD-10-CM | POA: Diagnosis not present

## 2020-03-02 DIAGNOSIS — M6281 Muscle weakness (generalized): Secondary | ICD-10-CM | POA: Diagnosis not present

## 2020-03-03 ENCOUNTER — Ambulatory Visit: Payer: Medicare HMO

## 2020-03-04 DIAGNOSIS — E785 Hyperlipidemia, unspecified: Secondary | ICD-10-CM | POA: Diagnosis not present

## 2020-03-04 DIAGNOSIS — R41841 Cognitive communication deficit: Secondary | ICD-10-CM | POA: Diagnosis not present

## 2020-03-04 DIAGNOSIS — I10 Essential (primary) hypertension: Secondary | ICD-10-CM | POA: Diagnosis not present

## 2020-03-04 DIAGNOSIS — F0390 Unspecified dementia without behavioral disturbance: Secondary | ICD-10-CM | POA: Diagnosis not present

## 2020-03-04 DIAGNOSIS — F329 Major depressive disorder, single episode, unspecified: Secondary | ICD-10-CM | POA: Diagnosis not present

## 2020-03-04 DIAGNOSIS — F0391 Unspecified dementia with behavioral disturbance: Secondary | ICD-10-CM | POA: Diagnosis not present

## 2020-03-04 DIAGNOSIS — R278 Other lack of coordination: Secondary | ICD-10-CM | POA: Diagnosis not present

## 2020-03-04 DIAGNOSIS — M6281 Muscle weakness (generalized): Secondary | ICD-10-CM | POA: Diagnosis not present

## 2020-03-08 DIAGNOSIS — R278 Other lack of coordination: Secondary | ICD-10-CM | POA: Diagnosis not present

## 2020-03-08 DIAGNOSIS — F0391 Unspecified dementia with behavioral disturbance: Secondary | ICD-10-CM | POA: Diagnosis not present

## 2020-03-08 DIAGNOSIS — M6281 Muscle weakness (generalized): Secondary | ICD-10-CM | POA: Diagnosis not present

## 2020-03-08 DIAGNOSIS — R41841 Cognitive communication deficit: Secondary | ICD-10-CM | POA: Diagnosis not present

## 2020-03-09 DIAGNOSIS — E119 Type 2 diabetes mellitus without complications: Secondary | ICD-10-CM | POA: Diagnosis not present

## 2020-03-09 DIAGNOSIS — E559 Vitamin D deficiency, unspecified: Secondary | ICD-10-CM | POA: Diagnosis not present

## 2020-03-09 DIAGNOSIS — D649 Anemia, unspecified: Secondary | ICD-10-CM | POA: Diagnosis not present

## 2020-03-09 DIAGNOSIS — M6281 Muscle weakness (generalized): Secondary | ICD-10-CM | POA: Diagnosis not present

## 2020-03-09 DIAGNOSIS — F0391 Unspecified dementia with behavioral disturbance: Secondary | ICD-10-CM | POA: Diagnosis not present

## 2020-03-09 DIAGNOSIS — E039 Hypothyroidism, unspecified: Secondary | ICD-10-CM | POA: Diagnosis not present

## 2020-03-09 DIAGNOSIS — R41841 Cognitive communication deficit: Secondary | ICD-10-CM | POA: Diagnosis not present

## 2020-03-09 DIAGNOSIS — R278 Other lack of coordination: Secondary | ICD-10-CM | POA: Diagnosis not present

## 2020-03-09 DIAGNOSIS — E785 Hyperlipidemia, unspecified: Secondary | ICD-10-CM | POA: Diagnosis not present

## 2020-03-09 DIAGNOSIS — E782 Mixed hyperlipidemia: Secondary | ICD-10-CM | POA: Diagnosis not present

## 2020-03-09 DIAGNOSIS — Z79899 Other long term (current) drug therapy: Secondary | ICD-10-CM | POA: Diagnosis not present

## 2020-03-09 DIAGNOSIS — D518 Other vitamin B12 deficiency anemias: Secondary | ICD-10-CM | POA: Diagnosis not present

## 2020-03-10 DIAGNOSIS — R278 Other lack of coordination: Secondary | ICD-10-CM | POA: Diagnosis not present

## 2020-03-10 DIAGNOSIS — M6281 Muscle weakness (generalized): Secondary | ICD-10-CM | POA: Diagnosis not present

## 2020-03-10 DIAGNOSIS — R41841 Cognitive communication deficit: Secondary | ICD-10-CM | POA: Diagnosis not present

## 2020-03-10 DIAGNOSIS — F0391 Unspecified dementia with behavioral disturbance: Secondary | ICD-10-CM | POA: Diagnosis not present

## 2020-03-11 DIAGNOSIS — R41841 Cognitive communication deficit: Secondary | ICD-10-CM | POA: Diagnosis not present

## 2020-03-11 DIAGNOSIS — F0391 Unspecified dementia with behavioral disturbance: Secondary | ICD-10-CM | POA: Diagnosis not present

## 2020-03-11 DIAGNOSIS — M6281 Muscle weakness (generalized): Secondary | ICD-10-CM | POA: Diagnosis not present

## 2020-03-11 DIAGNOSIS — R278 Other lack of coordination: Secondary | ICD-10-CM | POA: Diagnosis not present

## 2020-03-12 DIAGNOSIS — F0391 Unspecified dementia with behavioral disturbance: Secondary | ICD-10-CM | POA: Diagnosis not present

## 2020-03-12 DIAGNOSIS — R278 Other lack of coordination: Secondary | ICD-10-CM | POA: Diagnosis not present

## 2020-03-12 DIAGNOSIS — M6281 Muscle weakness (generalized): Secondary | ICD-10-CM | POA: Diagnosis not present

## 2020-03-12 DIAGNOSIS — R41841 Cognitive communication deficit: Secondary | ICD-10-CM | POA: Diagnosis not present

## 2020-03-13 DIAGNOSIS — R41841 Cognitive communication deficit: Secondary | ICD-10-CM | POA: Diagnosis not present

## 2020-03-13 DIAGNOSIS — M6281 Muscle weakness (generalized): Secondary | ICD-10-CM | POA: Diagnosis not present

## 2020-03-13 DIAGNOSIS — F0391 Unspecified dementia with behavioral disturbance: Secondary | ICD-10-CM | POA: Diagnosis not present

## 2020-03-13 DIAGNOSIS — R278 Other lack of coordination: Secondary | ICD-10-CM | POA: Diagnosis not present

## 2020-03-13 DIAGNOSIS — R7611 Nonspecific reaction to tuberculin skin test without active tuberculosis: Secondary | ICD-10-CM | POA: Diagnosis not present

## 2020-03-14 DIAGNOSIS — R278 Other lack of coordination: Secondary | ICD-10-CM | POA: Diagnosis not present

## 2020-03-14 DIAGNOSIS — F0391 Unspecified dementia with behavioral disturbance: Secondary | ICD-10-CM | POA: Diagnosis not present

## 2020-03-14 DIAGNOSIS — M6281 Muscle weakness (generalized): Secondary | ICD-10-CM | POA: Diagnosis not present

## 2020-03-14 DIAGNOSIS — R41841 Cognitive communication deficit: Secondary | ICD-10-CM | POA: Diagnosis not present

## 2020-03-15 DIAGNOSIS — F0391 Unspecified dementia with behavioral disturbance: Secondary | ICD-10-CM | POA: Diagnosis not present

## 2020-03-15 DIAGNOSIS — R41841 Cognitive communication deficit: Secondary | ICD-10-CM | POA: Diagnosis not present

## 2020-03-15 DIAGNOSIS — R278 Other lack of coordination: Secondary | ICD-10-CM | POA: Diagnosis not present

## 2020-03-15 DIAGNOSIS — M6281 Muscle weakness (generalized): Secondary | ICD-10-CM | POA: Diagnosis not present

## 2020-03-16 DIAGNOSIS — R41841 Cognitive communication deficit: Secondary | ICD-10-CM | POA: Diagnosis not present

## 2020-03-16 DIAGNOSIS — F0391 Unspecified dementia with behavioral disturbance: Secondary | ICD-10-CM | POA: Diagnosis not present

## 2020-03-16 DIAGNOSIS — R278 Other lack of coordination: Secondary | ICD-10-CM | POA: Diagnosis not present

## 2020-03-16 DIAGNOSIS — M6281 Muscle weakness (generalized): Secondary | ICD-10-CM | POA: Diagnosis not present

## 2020-03-17 DIAGNOSIS — R41841 Cognitive communication deficit: Secondary | ICD-10-CM | POA: Diagnosis not present

## 2020-03-17 DIAGNOSIS — M6281 Muscle weakness (generalized): Secondary | ICD-10-CM | POA: Diagnosis not present

## 2020-03-17 DIAGNOSIS — M2350 Chronic instability of knee, unspecified knee: Secondary | ICD-10-CM | POA: Diagnosis not present

## 2020-03-17 DIAGNOSIS — M2351 Chronic instability of knee, right knee: Secondary | ICD-10-CM | POA: Diagnosis not present

## 2020-03-17 DIAGNOSIS — F0391 Unspecified dementia with behavioral disturbance: Secondary | ICD-10-CM | POA: Diagnosis not present

## 2020-03-17 DIAGNOSIS — M25561 Pain in right knee: Secondary | ICD-10-CM | POA: Diagnosis not present

## 2020-03-17 DIAGNOSIS — R278 Other lack of coordination: Secondary | ICD-10-CM | POA: Diagnosis not present

## 2020-03-18 DIAGNOSIS — R278 Other lack of coordination: Secondary | ICD-10-CM | POA: Diagnosis not present

## 2020-03-18 DIAGNOSIS — M6281 Muscle weakness (generalized): Secondary | ICD-10-CM | POA: Diagnosis not present

## 2020-03-18 DIAGNOSIS — R41841 Cognitive communication deficit: Secondary | ICD-10-CM | POA: Diagnosis not present

## 2020-03-18 DIAGNOSIS — F0391 Unspecified dementia with behavioral disturbance: Secondary | ICD-10-CM | POA: Diagnosis not present

## 2020-03-19 DIAGNOSIS — R41841 Cognitive communication deficit: Secondary | ICD-10-CM | POA: Diagnosis not present

## 2020-03-19 DIAGNOSIS — M6281 Muscle weakness (generalized): Secondary | ICD-10-CM | POA: Diagnosis not present

## 2020-03-19 DIAGNOSIS — R278 Other lack of coordination: Secondary | ICD-10-CM | POA: Diagnosis not present

## 2020-03-19 DIAGNOSIS — F0391 Unspecified dementia with behavioral disturbance: Secondary | ICD-10-CM | POA: Diagnosis not present

## 2020-03-20 DIAGNOSIS — F0391 Unspecified dementia with behavioral disturbance: Secondary | ICD-10-CM | POA: Diagnosis not present

## 2020-03-20 DIAGNOSIS — M6281 Muscle weakness (generalized): Secondary | ICD-10-CM | POA: Diagnosis not present

## 2020-03-20 DIAGNOSIS — R278 Other lack of coordination: Secondary | ICD-10-CM | POA: Diagnosis not present

## 2020-03-20 DIAGNOSIS — R41841 Cognitive communication deficit: Secondary | ICD-10-CM | POA: Diagnosis not present

## 2020-03-21 DIAGNOSIS — R41841 Cognitive communication deficit: Secondary | ICD-10-CM | POA: Diagnosis not present

## 2020-03-21 DIAGNOSIS — R278 Other lack of coordination: Secondary | ICD-10-CM | POA: Diagnosis not present

## 2020-03-21 DIAGNOSIS — F0391 Unspecified dementia with behavioral disturbance: Secondary | ICD-10-CM | POA: Diagnosis not present

## 2020-03-21 DIAGNOSIS — M6281 Muscle weakness (generalized): Secondary | ICD-10-CM | POA: Diagnosis not present

## 2020-03-22 DIAGNOSIS — M6281 Muscle weakness (generalized): Secondary | ICD-10-CM | POA: Diagnosis not present

## 2020-03-22 DIAGNOSIS — F0391 Unspecified dementia with behavioral disturbance: Secondary | ICD-10-CM | POA: Diagnosis not present

## 2020-03-22 DIAGNOSIS — R41841 Cognitive communication deficit: Secondary | ICD-10-CM | POA: Diagnosis not present

## 2020-03-22 DIAGNOSIS — R278 Other lack of coordination: Secondary | ICD-10-CM | POA: Diagnosis not present

## 2020-03-23 DIAGNOSIS — R278 Other lack of coordination: Secondary | ICD-10-CM | POA: Diagnosis not present

## 2020-03-23 DIAGNOSIS — F0391 Unspecified dementia with behavioral disturbance: Secondary | ICD-10-CM | POA: Diagnosis not present

## 2020-03-23 DIAGNOSIS — R41841 Cognitive communication deficit: Secondary | ICD-10-CM | POA: Diagnosis not present

## 2020-03-23 DIAGNOSIS — M6281 Muscle weakness (generalized): Secondary | ICD-10-CM | POA: Diagnosis not present

## 2020-03-24 DIAGNOSIS — F0391 Unspecified dementia with behavioral disturbance: Secondary | ICD-10-CM | POA: Diagnosis not present

## 2020-03-24 DIAGNOSIS — R278 Other lack of coordination: Secondary | ICD-10-CM | POA: Diagnosis not present

## 2020-03-24 DIAGNOSIS — M6281 Muscle weakness (generalized): Secondary | ICD-10-CM | POA: Diagnosis not present

## 2020-03-24 DIAGNOSIS — R41841 Cognitive communication deficit: Secondary | ICD-10-CM | POA: Diagnosis not present

## 2020-03-25 DIAGNOSIS — R41841 Cognitive communication deficit: Secondary | ICD-10-CM | POA: Diagnosis not present

## 2020-03-25 DIAGNOSIS — F0391 Unspecified dementia with behavioral disturbance: Secondary | ICD-10-CM | POA: Diagnosis not present

## 2020-03-25 DIAGNOSIS — M6281 Muscle weakness (generalized): Secondary | ICD-10-CM | POA: Diagnosis not present

## 2020-03-25 DIAGNOSIS — R278 Other lack of coordination: Secondary | ICD-10-CM | POA: Diagnosis not present

## 2020-03-26 DIAGNOSIS — R41841 Cognitive communication deficit: Secondary | ICD-10-CM | POA: Diagnosis not present

## 2020-03-26 DIAGNOSIS — R278 Other lack of coordination: Secondary | ICD-10-CM | POA: Diagnosis not present

## 2020-03-26 DIAGNOSIS — F0391 Unspecified dementia with behavioral disturbance: Secondary | ICD-10-CM | POA: Diagnosis not present

## 2020-03-26 DIAGNOSIS — M6281 Muscle weakness (generalized): Secondary | ICD-10-CM | POA: Diagnosis not present

## 2020-03-27 DIAGNOSIS — M6281 Muscle weakness (generalized): Secondary | ICD-10-CM | POA: Diagnosis not present

## 2020-03-27 DIAGNOSIS — R41841 Cognitive communication deficit: Secondary | ICD-10-CM | POA: Diagnosis not present

## 2020-03-27 DIAGNOSIS — R278 Other lack of coordination: Secondary | ICD-10-CM | POA: Diagnosis not present

## 2020-03-27 DIAGNOSIS — F0391 Unspecified dementia with behavioral disturbance: Secondary | ICD-10-CM | POA: Diagnosis not present

## 2020-03-28 DIAGNOSIS — M6281 Muscle weakness (generalized): Secondary | ICD-10-CM | POA: Diagnosis not present

## 2020-03-28 DIAGNOSIS — F0391 Unspecified dementia with behavioral disturbance: Secondary | ICD-10-CM | POA: Diagnosis not present

## 2020-03-28 DIAGNOSIS — R278 Other lack of coordination: Secondary | ICD-10-CM | POA: Diagnosis not present

## 2020-03-28 DIAGNOSIS — R41841 Cognitive communication deficit: Secondary | ICD-10-CM | POA: Diagnosis not present

## 2020-03-29 DIAGNOSIS — R41841 Cognitive communication deficit: Secondary | ICD-10-CM | POA: Diagnosis not present

## 2020-03-29 DIAGNOSIS — F0391 Unspecified dementia with behavioral disturbance: Secondary | ICD-10-CM | POA: Diagnosis not present

## 2020-03-29 DIAGNOSIS — R278 Other lack of coordination: Secondary | ICD-10-CM | POA: Diagnosis not present

## 2020-03-29 DIAGNOSIS — M6281 Muscle weakness (generalized): Secondary | ICD-10-CM | POA: Diagnosis not present

## 2020-03-30 DIAGNOSIS — F0391 Unspecified dementia with behavioral disturbance: Secondary | ICD-10-CM | POA: Diagnosis not present

## 2020-03-30 DIAGNOSIS — M6281 Muscle weakness (generalized): Secondary | ICD-10-CM | POA: Diagnosis not present

## 2020-03-30 DIAGNOSIS — R41841 Cognitive communication deficit: Secondary | ICD-10-CM | POA: Diagnosis not present

## 2020-03-30 DIAGNOSIS — R278 Other lack of coordination: Secondary | ICD-10-CM | POA: Diagnosis not present

## 2020-03-31 DIAGNOSIS — M6281 Muscle weakness (generalized): Secondary | ICD-10-CM | POA: Diagnosis not present

## 2020-03-31 DIAGNOSIS — F0391 Unspecified dementia with behavioral disturbance: Secondary | ICD-10-CM | POA: Diagnosis not present

## 2020-03-31 DIAGNOSIS — R41841 Cognitive communication deficit: Secondary | ICD-10-CM | POA: Diagnosis not present

## 2020-03-31 DIAGNOSIS — R278 Other lack of coordination: Secondary | ICD-10-CM | POA: Diagnosis not present

## 2020-04-01 DIAGNOSIS — R41841 Cognitive communication deficit: Secondary | ICD-10-CM | POA: Diagnosis not present

## 2020-04-01 DIAGNOSIS — R278 Other lack of coordination: Secondary | ICD-10-CM | POA: Diagnosis not present

## 2020-04-01 DIAGNOSIS — F0391 Unspecified dementia with behavioral disturbance: Secondary | ICD-10-CM | POA: Diagnosis not present

## 2020-04-01 DIAGNOSIS — M6281 Muscle weakness (generalized): Secondary | ICD-10-CM | POA: Diagnosis not present

## 2020-04-02 DIAGNOSIS — R278 Other lack of coordination: Secondary | ICD-10-CM | POA: Diagnosis not present

## 2020-04-02 DIAGNOSIS — M6281 Muscle weakness (generalized): Secondary | ICD-10-CM | POA: Diagnosis not present

## 2020-04-02 DIAGNOSIS — R41841 Cognitive communication deficit: Secondary | ICD-10-CM | POA: Diagnosis not present

## 2020-04-02 DIAGNOSIS — F0391 Unspecified dementia with behavioral disturbance: Secondary | ICD-10-CM | POA: Diagnosis not present

## 2020-04-03 DIAGNOSIS — M6281 Muscle weakness (generalized): Secondary | ICD-10-CM | POA: Diagnosis not present

## 2020-04-03 DIAGNOSIS — F0391 Unspecified dementia with behavioral disturbance: Secondary | ICD-10-CM | POA: Diagnosis not present

## 2020-04-03 DIAGNOSIS — R278 Other lack of coordination: Secondary | ICD-10-CM | POA: Diagnosis not present

## 2020-04-03 DIAGNOSIS — R41841 Cognitive communication deficit: Secondary | ICD-10-CM | POA: Diagnosis not present

## 2020-04-05 DIAGNOSIS — R278 Other lack of coordination: Secondary | ICD-10-CM | POA: Diagnosis not present

## 2020-04-05 DIAGNOSIS — R41841 Cognitive communication deficit: Secondary | ICD-10-CM | POA: Diagnosis not present

## 2020-04-05 DIAGNOSIS — M6281 Muscle weakness (generalized): Secondary | ICD-10-CM | POA: Diagnosis not present

## 2020-04-05 DIAGNOSIS — F0391 Unspecified dementia with behavioral disturbance: Secondary | ICD-10-CM | POA: Diagnosis not present

## 2020-04-06 DIAGNOSIS — R278 Other lack of coordination: Secondary | ICD-10-CM | POA: Diagnosis not present

## 2020-04-06 DIAGNOSIS — F0391 Unspecified dementia with behavioral disturbance: Secondary | ICD-10-CM | POA: Diagnosis not present

## 2020-04-06 DIAGNOSIS — M6281 Muscle weakness (generalized): Secondary | ICD-10-CM | POA: Diagnosis not present

## 2020-04-06 DIAGNOSIS — R41841 Cognitive communication deficit: Secondary | ICD-10-CM | POA: Diagnosis not present

## 2020-04-07 DIAGNOSIS — M6281 Muscle weakness (generalized): Secondary | ICD-10-CM | POA: Diagnosis not present

## 2020-04-07 DIAGNOSIS — R278 Other lack of coordination: Secondary | ICD-10-CM | POA: Diagnosis not present

## 2020-04-07 DIAGNOSIS — R41841 Cognitive communication deficit: Secondary | ICD-10-CM | POA: Diagnosis not present

## 2020-04-07 DIAGNOSIS — F0391 Unspecified dementia with behavioral disturbance: Secondary | ICD-10-CM | POA: Diagnosis not present

## 2020-04-08 DIAGNOSIS — M6281 Muscle weakness (generalized): Secondary | ICD-10-CM | POA: Diagnosis not present

## 2020-04-08 DIAGNOSIS — R278 Other lack of coordination: Secondary | ICD-10-CM | POA: Diagnosis not present

## 2020-04-08 DIAGNOSIS — E785 Hyperlipidemia, unspecified: Secondary | ICD-10-CM | POA: Diagnosis not present

## 2020-04-08 DIAGNOSIS — F0391 Unspecified dementia with behavioral disturbance: Secondary | ICD-10-CM | POA: Diagnosis not present

## 2020-04-08 DIAGNOSIS — F329 Major depressive disorder, single episode, unspecified: Secondary | ICD-10-CM | POA: Diagnosis not present

## 2020-04-08 DIAGNOSIS — F0390 Unspecified dementia without behavioral disturbance: Secondary | ICD-10-CM | POA: Diagnosis not present

## 2020-04-08 DIAGNOSIS — R41841 Cognitive communication deficit: Secondary | ICD-10-CM | POA: Diagnosis not present

## 2020-04-09 DIAGNOSIS — F0391 Unspecified dementia with behavioral disturbance: Secondary | ICD-10-CM | POA: Diagnosis not present

## 2020-04-09 DIAGNOSIS — R278 Other lack of coordination: Secondary | ICD-10-CM | POA: Diagnosis not present

## 2020-04-09 DIAGNOSIS — R41841 Cognitive communication deficit: Secondary | ICD-10-CM | POA: Diagnosis not present

## 2020-04-09 DIAGNOSIS — M6281 Muscle weakness (generalized): Secondary | ICD-10-CM | POA: Diagnosis not present

## 2020-04-10 DIAGNOSIS — R278 Other lack of coordination: Secondary | ICD-10-CM | POA: Diagnosis not present

## 2020-04-10 DIAGNOSIS — M6281 Muscle weakness (generalized): Secondary | ICD-10-CM | POA: Diagnosis not present

## 2020-04-10 DIAGNOSIS — R41841 Cognitive communication deficit: Secondary | ICD-10-CM | POA: Diagnosis not present

## 2020-04-10 DIAGNOSIS — F0391 Unspecified dementia with behavioral disturbance: Secondary | ICD-10-CM | POA: Diagnosis not present

## 2020-04-12 DIAGNOSIS — F0391 Unspecified dementia with behavioral disturbance: Secondary | ICD-10-CM | POA: Diagnosis not present

## 2020-04-12 DIAGNOSIS — R278 Other lack of coordination: Secondary | ICD-10-CM | POA: Diagnosis not present

## 2020-04-12 DIAGNOSIS — M6281 Muscle weakness (generalized): Secondary | ICD-10-CM | POA: Diagnosis not present

## 2020-04-12 DIAGNOSIS — R41841 Cognitive communication deficit: Secondary | ICD-10-CM | POA: Diagnosis not present

## 2020-04-13 DIAGNOSIS — M6281 Muscle weakness (generalized): Secondary | ICD-10-CM | POA: Diagnosis not present

## 2020-04-13 DIAGNOSIS — R278 Other lack of coordination: Secondary | ICD-10-CM | POA: Diagnosis not present

## 2020-04-13 DIAGNOSIS — F0391 Unspecified dementia with behavioral disturbance: Secondary | ICD-10-CM | POA: Diagnosis not present

## 2020-04-13 DIAGNOSIS — R41841 Cognitive communication deficit: Secondary | ICD-10-CM | POA: Diagnosis not present

## 2020-04-14 DIAGNOSIS — R278 Other lack of coordination: Secondary | ICD-10-CM | POA: Diagnosis not present

## 2020-04-14 DIAGNOSIS — F0391 Unspecified dementia with behavioral disturbance: Secondary | ICD-10-CM | POA: Diagnosis not present

## 2020-04-14 DIAGNOSIS — R41841 Cognitive communication deficit: Secondary | ICD-10-CM | POA: Diagnosis not present

## 2020-04-14 DIAGNOSIS — M6281 Muscle weakness (generalized): Secondary | ICD-10-CM | POA: Diagnosis not present

## 2020-04-15 DIAGNOSIS — R278 Other lack of coordination: Secondary | ICD-10-CM | POA: Diagnosis not present

## 2020-04-15 DIAGNOSIS — F0391 Unspecified dementia with behavioral disturbance: Secondary | ICD-10-CM | POA: Diagnosis not present

## 2020-04-15 DIAGNOSIS — M6281 Muscle weakness (generalized): Secondary | ICD-10-CM | POA: Diagnosis not present

## 2020-04-15 DIAGNOSIS — R41841 Cognitive communication deficit: Secondary | ICD-10-CM | POA: Diagnosis not present

## 2020-04-16 DIAGNOSIS — R41841 Cognitive communication deficit: Secondary | ICD-10-CM | POA: Diagnosis not present

## 2020-04-16 DIAGNOSIS — R278 Other lack of coordination: Secondary | ICD-10-CM | POA: Diagnosis not present

## 2020-04-16 DIAGNOSIS — M6281 Muscle weakness (generalized): Secondary | ICD-10-CM | POA: Diagnosis not present

## 2020-04-16 DIAGNOSIS — F0391 Unspecified dementia with behavioral disturbance: Secondary | ICD-10-CM | POA: Diagnosis not present

## 2020-04-21 ENCOUNTER — Ambulatory Visit: Payer: Medicare HMO | Admitting: Family Medicine

## 2020-05-07 DIAGNOSIS — E785 Hyperlipidemia, unspecified: Secondary | ICD-10-CM | POA: Diagnosis not present

## 2020-05-07 DIAGNOSIS — D649 Anemia, unspecified: Secondary | ICD-10-CM | POA: Diagnosis not present

## 2020-05-07 DIAGNOSIS — I10 Essential (primary) hypertension: Secondary | ICD-10-CM | POA: Diagnosis not present

## 2020-05-07 DIAGNOSIS — E559 Vitamin D deficiency, unspecified: Secondary | ICD-10-CM | POA: Diagnosis not present

## 2020-05-31 ENCOUNTER — Ambulatory Visit: Payer: Medicare HMO | Admitting: Family Medicine

## 2020-05-31 DIAGNOSIS — D649 Anemia, unspecified: Secondary | ICD-10-CM | POA: Diagnosis not present

## 2020-05-31 DIAGNOSIS — I10 Essential (primary) hypertension: Secondary | ICD-10-CM | POA: Diagnosis not present

## 2020-05-31 DIAGNOSIS — E785 Hyperlipidemia, unspecified: Secondary | ICD-10-CM | POA: Diagnosis not present

## 2020-05-31 DIAGNOSIS — F0390 Unspecified dementia without behavioral disturbance: Secondary | ICD-10-CM | POA: Diagnosis not present

## 2020-05-31 DIAGNOSIS — F329 Major depressive disorder, single episode, unspecified: Secondary | ICD-10-CM | POA: Diagnosis not present

## 2020-06-18 DIAGNOSIS — F0391 Unspecified dementia with behavioral disturbance: Secondary | ICD-10-CM | POA: Diagnosis not present

## 2020-06-18 DIAGNOSIS — I10 Essential (primary) hypertension: Secondary | ICD-10-CM | POA: Diagnosis not present

## 2020-06-18 DIAGNOSIS — K219 Gastro-esophageal reflux disease without esophagitis: Secondary | ICD-10-CM | POA: Diagnosis not present

## 2020-06-18 DIAGNOSIS — F329 Major depressive disorder, single episode, unspecified: Secondary | ICD-10-CM | POA: Diagnosis not present

## 2020-06-21 IMAGING — CT CT HEAD W/O CM
3 series · 16 of 47 positions shown, 19 images · non-contrast
Comparison: July 15, 2013.

CLINICAL DATA: Altered mental status.

EXAM:
CT HEAD WITHOUT CONTRAST
TECHNIQUE: Contiguous axial images were obtained from the base of the skull
through the vertex without intravenous contrast.

[Series 2: head w o · axial · 0.43mm/px · z∈[+57,+182]mm · 10 of 31 slices shown, 13 images]
[im 3/31  brain]
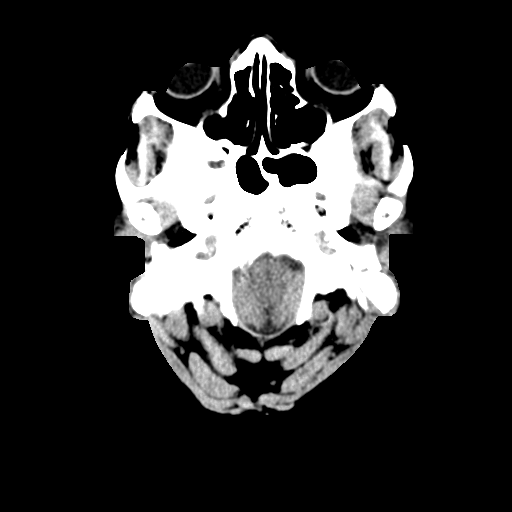
[im 3/31  bone]
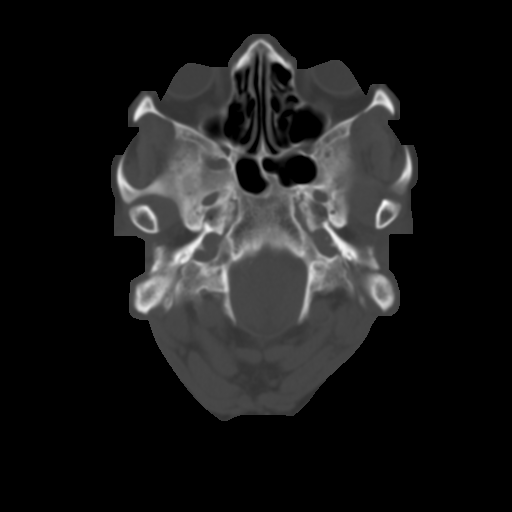
[im 6/31  brain]
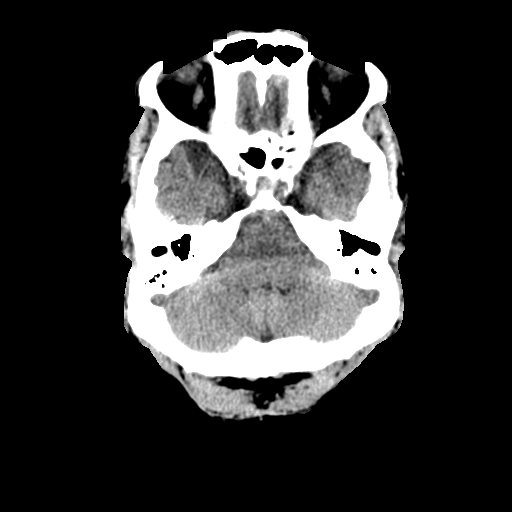
[im 9/31  brain]
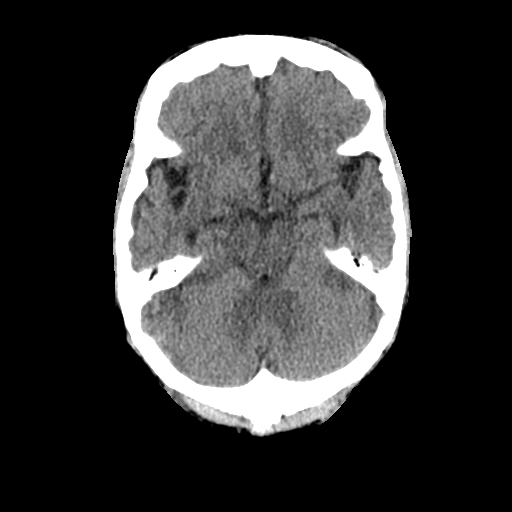
[im 11/31  brain]
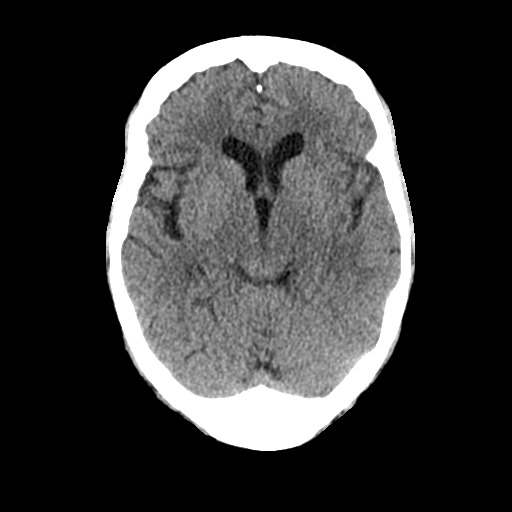
[im 14/31  brain]
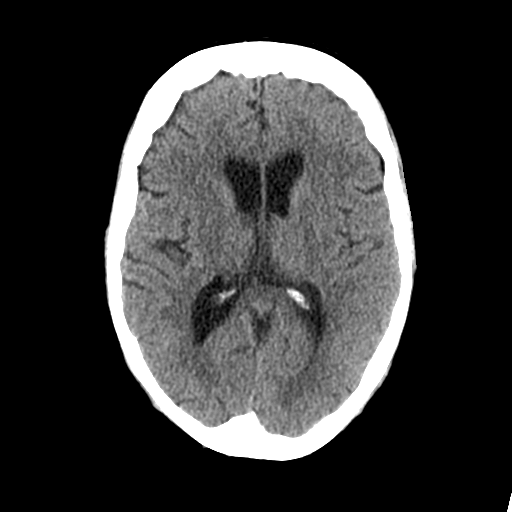
[im 14/31  bone]
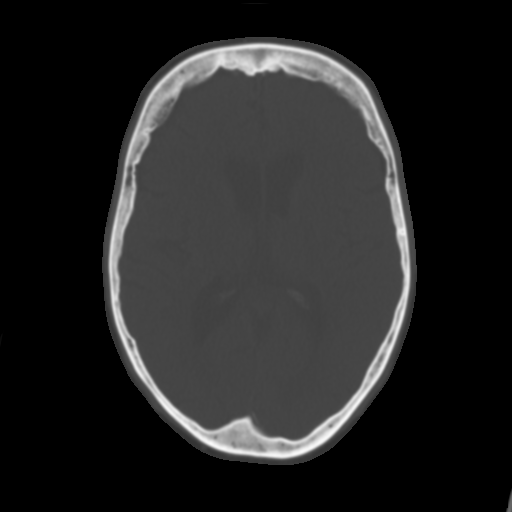
[im 17/31  brain]
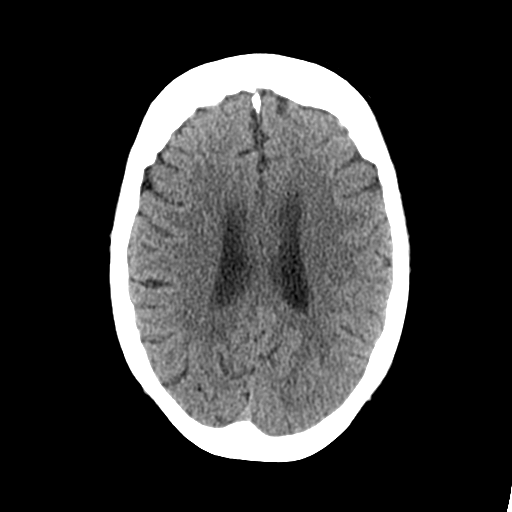
[im 20/31  brain]
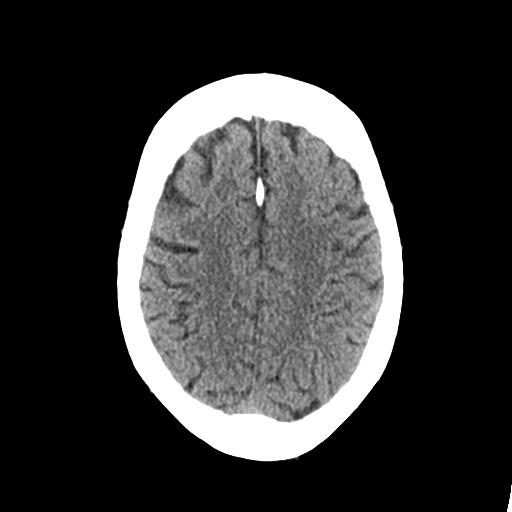
[im 23/31  brain]
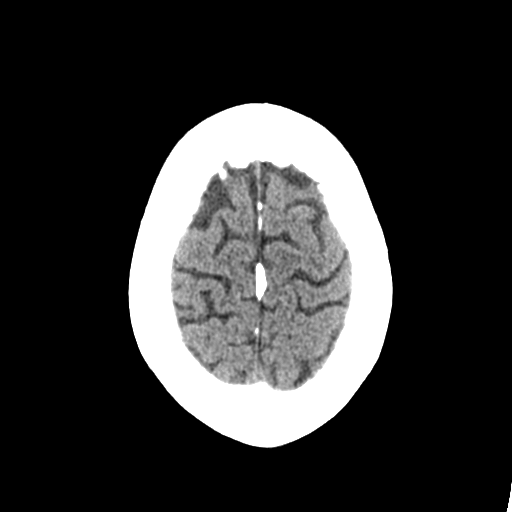
[im 25/31  brain]
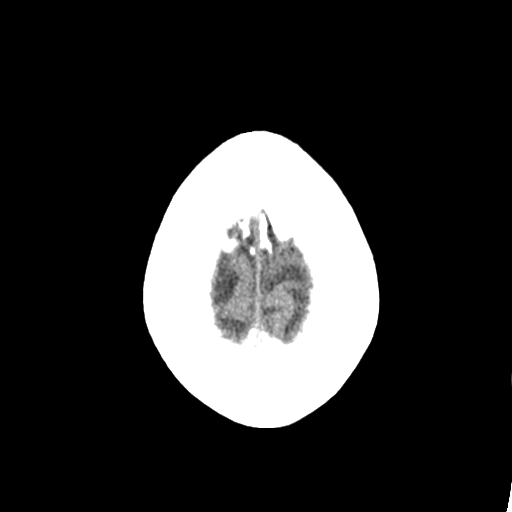
[im 25/31  bone]
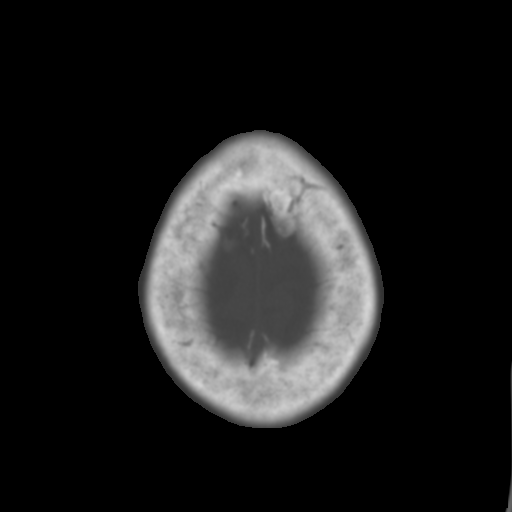
[im 28/31  brain]
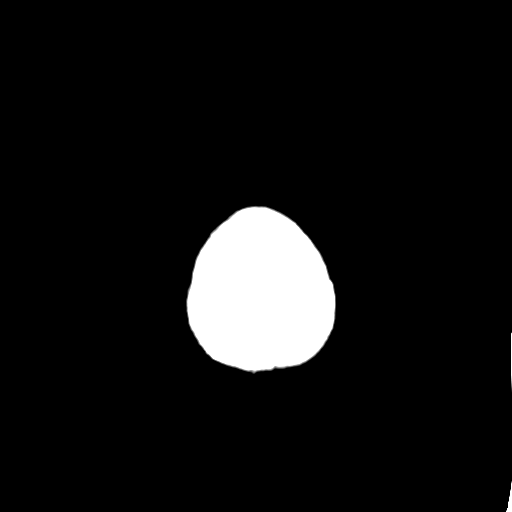

[Series 4: coronal soft · coronal · 0.31mm/px · 3 of 67 slices shown]
[im 23/67  brain]
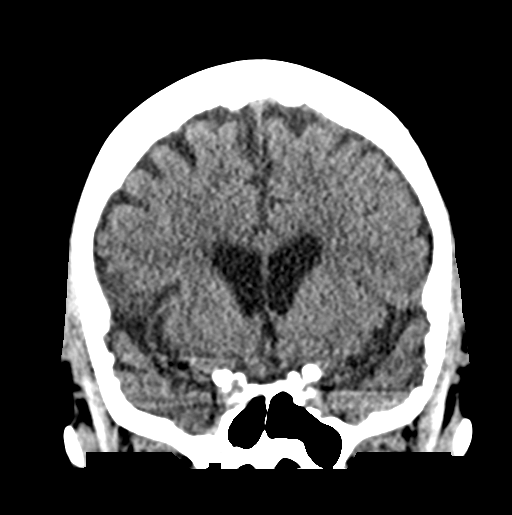
[im 30/67  brain]
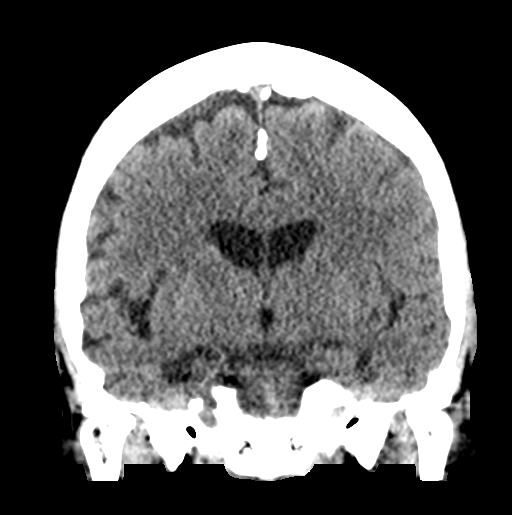
[im 37/67  brain]
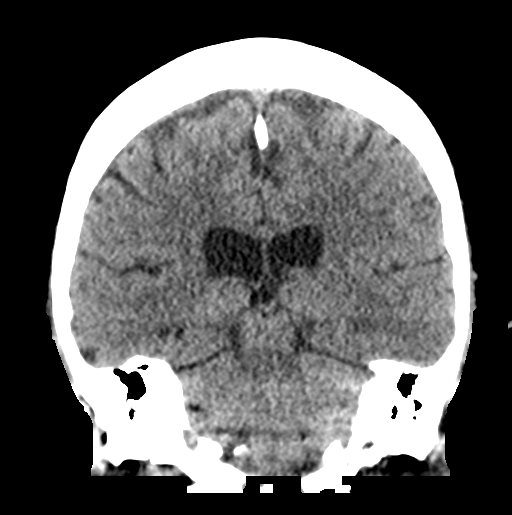

[Series 5: sagittal soft · sagittal · 0.34mm/px · 3 of 56 slices shown]
[im 19/56  brain]
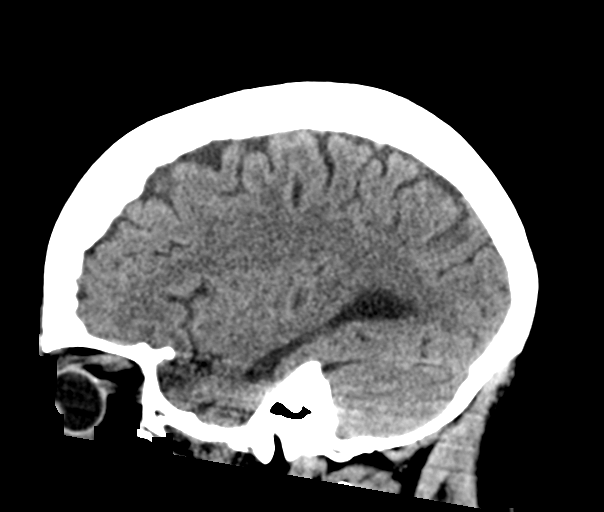
[im 28/56  brain]
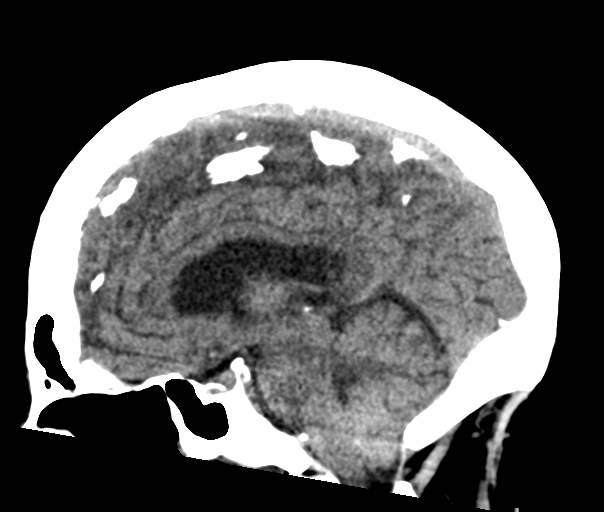
[im 37/56  brain]
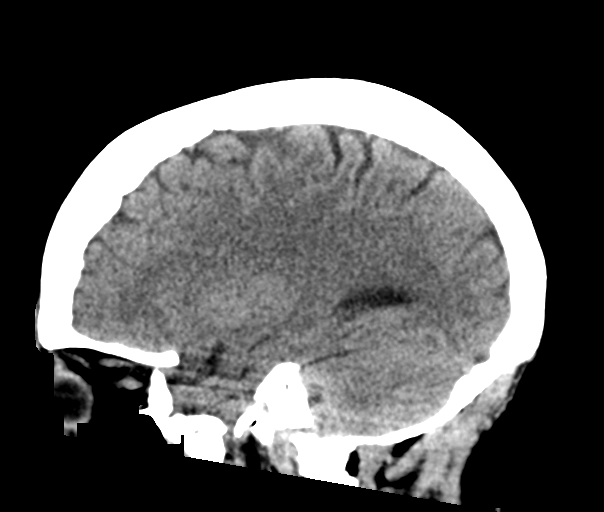

[16 of 47 positions shown; findings below may reference images not displayed]

FINDINGS: Brain: No evidence of acute infarction, hemorrhage, hydrocephalus,
extra-axial collection or mass lesion/mass effect.

Vascular: No hyperdense vessel or unexpected calcification.

Skull: Normal. Negative for fracture or focal lesion.

Sinuses/Orbits: No acute finding.

Other: None.
IMPRESSION: Normal head CT.

## 2020-07-20 DIAGNOSIS — F0390 Unspecified dementia without behavioral disturbance: Secondary | ICD-10-CM | POA: Diagnosis not present

## 2020-07-20 DIAGNOSIS — E785 Hyperlipidemia, unspecified: Secondary | ICD-10-CM | POA: Diagnosis not present

## 2020-07-20 DIAGNOSIS — I10 Essential (primary) hypertension: Secondary | ICD-10-CM | POA: Diagnosis not present

## 2020-07-28 DIAGNOSIS — I10 Essential (primary) hypertension: Secondary | ICD-10-CM | POA: Diagnosis not present

## 2020-07-28 DIAGNOSIS — M1990 Unspecified osteoarthritis, unspecified site: Secondary | ICD-10-CM | POA: Diagnosis not present

## 2020-07-28 DIAGNOSIS — M25561 Pain in right knee: Secondary | ICD-10-CM | POA: Diagnosis not present

## 2020-07-28 DIAGNOSIS — F0390 Unspecified dementia without behavioral disturbance: Secondary | ICD-10-CM | POA: Diagnosis not present

## 2020-08-25 DIAGNOSIS — I959 Hypotension, unspecified: Secondary | ICD-10-CM | POA: Diagnosis not present

## 2020-08-25 DIAGNOSIS — E162 Hypoglycemia, unspecified: Secondary | ICD-10-CM | POA: Diagnosis not present

## 2020-08-25 DIAGNOSIS — N189 Chronic kidney disease, unspecified: Secondary | ICD-10-CM | POA: Diagnosis not present

## 2020-08-25 DIAGNOSIS — R55 Syncope and collapse: Secondary | ICD-10-CM | POA: Diagnosis not present

## 2020-08-25 DIAGNOSIS — D649 Anemia, unspecified: Secondary | ICD-10-CM | POA: Diagnosis not present

## 2020-08-30 DIAGNOSIS — M6281 Muscle weakness (generalized): Secondary | ICD-10-CM | POA: Diagnosis not present

## 2020-08-30 DIAGNOSIS — M1990 Unspecified osteoarthritis, unspecified site: Secondary | ICD-10-CM | POA: Diagnosis not present

## 2020-08-30 DIAGNOSIS — M25561 Pain in right knee: Secondary | ICD-10-CM | POA: Diagnosis not present

## 2020-08-30 DIAGNOSIS — F0391 Unspecified dementia with behavioral disturbance: Secondary | ICD-10-CM | POA: Diagnosis not present

## 2020-09-02 DIAGNOSIS — M6281 Muscle weakness (generalized): Secondary | ICD-10-CM | POA: Diagnosis not present

## 2020-09-02 DIAGNOSIS — F0391 Unspecified dementia with behavioral disturbance: Secondary | ICD-10-CM | POA: Diagnosis not present

## 2020-09-02 DIAGNOSIS — M25561 Pain in right knee: Secondary | ICD-10-CM | POA: Diagnosis not present

## 2020-09-02 DIAGNOSIS — M1990 Unspecified osteoarthritis, unspecified site: Secondary | ICD-10-CM | POA: Diagnosis not present

## 2020-09-03 DIAGNOSIS — M1990 Unspecified osteoarthritis, unspecified site: Secondary | ICD-10-CM | POA: Diagnosis not present

## 2020-09-03 DIAGNOSIS — F0391 Unspecified dementia with behavioral disturbance: Secondary | ICD-10-CM | POA: Diagnosis not present

## 2020-09-03 DIAGNOSIS — M25561 Pain in right knee: Secondary | ICD-10-CM | POA: Diagnosis not present

## 2020-09-03 DIAGNOSIS — M6281 Muscle weakness (generalized): Secondary | ICD-10-CM | POA: Diagnosis not present

## 2020-09-04 DIAGNOSIS — M1990 Unspecified osteoarthritis, unspecified site: Secondary | ICD-10-CM | POA: Diagnosis not present

## 2020-09-04 DIAGNOSIS — F0391 Unspecified dementia with behavioral disturbance: Secondary | ICD-10-CM | POA: Diagnosis not present

## 2020-09-04 DIAGNOSIS — M6281 Muscle weakness (generalized): Secondary | ICD-10-CM | POA: Diagnosis not present

## 2020-09-04 DIAGNOSIS — M25561 Pain in right knee: Secondary | ICD-10-CM | POA: Diagnosis not present

## 2020-09-06 DIAGNOSIS — M25561 Pain in right knee: Secondary | ICD-10-CM | POA: Diagnosis not present

## 2020-09-06 DIAGNOSIS — F0391 Unspecified dementia with behavioral disturbance: Secondary | ICD-10-CM | POA: Diagnosis not present

## 2020-09-06 DIAGNOSIS — M1990 Unspecified osteoarthritis, unspecified site: Secondary | ICD-10-CM | POA: Diagnosis not present

## 2020-09-06 DIAGNOSIS — M6281 Muscle weakness (generalized): Secondary | ICD-10-CM | POA: Diagnosis not present

## 2020-09-07 DIAGNOSIS — H25813 Combined forms of age-related cataract, bilateral: Secondary | ICD-10-CM | POA: Diagnosis not present

## 2020-09-07 DIAGNOSIS — H18413 Arcus senilis, bilateral: Secondary | ICD-10-CM | POA: Diagnosis not present

## 2020-09-07 DIAGNOSIS — H524 Presbyopia: Secondary | ICD-10-CM | POA: Diagnosis not present

## 2020-09-08 DIAGNOSIS — M25561 Pain in right knee: Secondary | ICD-10-CM | POA: Diagnosis not present

## 2020-09-08 DIAGNOSIS — F0391 Unspecified dementia with behavioral disturbance: Secondary | ICD-10-CM | POA: Diagnosis not present

## 2020-09-08 DIAGNOSIS — M6281 Muscle weakness (generalized): Secondary | ICD-10-CM | POA: Diagnosis not present

## 2020-09-08 DIAGNOSIS — M1990 Unspecified osteoarthritis, unspecified site: Secondary | ICD-10-CM | POA: Diagnosis not present

## 2020-09-09 DIAGNOSIS — M1990 Unspecified osteoarthritis, unspecified site: Secondary | ICD-10-CM | POA: Diagnosis not present

## 2020-09-09 DIAGNOSIS — F0391 Unspecified dementia with behavioral disturbance: Secondary | ICD-10-CM | POA: Diagnosis not present

## 2020-09-09 DIAGNOSIS — M25561 Pain in right knee: Secondary | ICD-10-CM | POA: Diagnosis not present

## 2020-09-09 DIAGNOSIS — M6281 Muscle weakness (generalized): Secondary | ICD-10-CM | POA: Diagnosis not present

## 2020-09-10 DIAGNOSIS — M1990 Unspecified osteoarthritis, unspecified site: Secondary | ICD-10-CM | POA: Diagnosis not present

## 2020-09-10 DIAGNOSIS — F0391 Unspecified dementia with behavioral disturbance: Secondary | ICD-10-CM | POA: Diagnosis not present

## 2020-09-10 DIAGNOSIS — M25561 Pain in right knee: Secondary | ICD-10-CM | POA: Diagnosis not present

## 2020-09-10 DIAGNOSIS — M6281 Muscle weakness (generalized): Secondary | ICD-10-CM | POA: Diagnosis not present

## 2020-09-11 DIAGNOSIS — F0391 Unspecified dementia with behavioral disturbance: Secondary | ICD-10-CM | POA: Diagnosis not present

## 2020-09-11 DIAGNOSIS — M1990 Unspecified osteoarthritis, unspecified site: Secondary | ICD-10-CM | POA: Diagnosis not present

## 2020-09-11 DIAGNOSIS — M6281 Muscle weakness (generalized): Secondary | ICD-10-CM | POA: Diagnosis not present

## 2020-09-11 DIAGNOSIS — M25561 Pain in right knee: Secondary | ICD-10-CM | POA: Diagnosis not present

## 2020-09-12 DIAGNOSIS — M25561 Pain in right knee: Secondary | ICD-10-CM | POA: Diagnosis not present

## 2020-09-12 DIAGNOSIS — F0391 Unspecified dementia with behavioral disturbance: Secondary | ICD-10-CM | POA: Diagnosis not present

## 2020-09-12 DIAGNOSIS — M6281 Muscle weakness (generalized): Secondary | ICD-10-CM | POA: Diagnosis not present

## 2020-09-12 DIAGNOSIS — M1990 Unspecified osteoarthritis, unspecified site: Secondary | ICD-10-CM | POA: Diagnosis not present

## 2020-09-13 DIAGNOSIS — M6281 Muscle weakness (generalized): Secondary | ICD-10-CM | POA: Diagnosis not present

## 2020-09-13 DIAGNOSIS — F0391 Unspecified dementia with behavioral disturbance: Secondary | ICD-10-CM | POA: Diagnosis not present

## 2020-09-13 DIAGNOSIS — M1990 Unspecified osteoarthritis, unspecified site: Secondary | ICD-10-CM | POA: Diagnosis not present

## 2020-09-13 DIAGNOSIS — M25561 Pain in right knee: Secondary | ICD-10-CM | POA: Diagnosis not present

## 2020-09-14 DIAGNOSIS — M1990 Unspecified osteoarthritis, unspecified site: Secondary | ICD-10-CM | POA: Diagnosis not present

## 2020-09-14 DIAGNOSIS — F0391 Unspecified dementia with behavioral disturbance: Secondary | ICD-10-CM | POA: Diagnosis not present

## 2020-09-14 DIAGNOSIS — M25561 Pain in right knee: Secondary | ICD-10-CM | POA: Diagnosis not present

## 2020-09-14 DIAGNOSIS — M6281 Muscle weakness (generalized): Secondary | ICD-10-CM | POA: Diagnosis not present

## 2020-09-15 DIAGNOSIS — M6281 Muscle weakness (generalized): Secondary | ICD-10-CM | POA: Diagnosis not present

## 2020-09-15 DIAGNOSIS — F0391 Unspecified dementia with behavioral disturbance: Secondary | ICD-10-CM | POA: Diagnosis not present

## 2020-09-15 DIAGNOSIS — M25561 Pain in right knee: Secondary | ICD-10-CM | POA: Diagnosis not present

## 2020-09-15 DIAGNOSIS — M1990 Unspecified osteoarthritis, unspecified site: Secondary | ICD-10-CM | POA: Diagnosis not present

## 2020-09-20 DIAGNOSIS — M25561 Pain in right knee: Secondary | ICD-10-CM | POA: Diagnosis not present

## 2020-09-20 DIAGNOSIS — M6281 Muscle weakness (generalized): Secondary | ICD-10-CM | POA: Diagnosis not present

## 2020-09-20 DIAGNOSIS — F0391 Unspecified dementia with behavioral disturbance: Secondary | ICD-10-CM | POA: Diagnosis not present

## 2020-09-20 DIAGNOSIS — M1990 Unspecified osteoarthritis, unspecified site: Secondary | ICD-10-CM | POA: Diagnosis not present

## 2020-09-21 DIAGNOSIS — M1990 Unspecified osteoarthritis, unspecified site: Secondary | ICD-10-CM | POA: Diagnosis not present

## 2020-09-21 DIAGNOSIS — F0391 Unspecified dementia with behavioral disturbance: Secondary | ICD-10-CM | POA: Diagnosis not present

## 2020-09-21 DIAGNOSIS — M6281 Muscle weakness (generalized): Secondary | ICD-10-CM | POA: Diagnosis not present

## 2020-09-21 DIAGNOSIS — M25561 Pain in right knee: Secondary | ICD-10-CM | POA: Diagnosis not present

## 2020-09-22 DIAGNOSIS — M6281 Muscle weakness (generalized): Secondary | ICD-10-CM | POA: Diagnosis not present

## 2020-09-22 DIAGNOSIS — M25561 Pain in right knee: Secondary | ICD-10-CM | POA: Diagnosis not present

## 2020-09-22 DIAGNOSIS — F0391 Unspecified dementia with behavioral disturbance: Secondary | ICD-10-CM | POA: Diagnosis not present

## 2020-09-22 DIAGNOSIS — M1990 Unspecified osteoarthritis, unspecified site: Secondary | ICD-10-CM | POA: Diagnosis not present

## 2020-09-23 DIAGNOSIS — M1990 Unspecified osteoarthritis, unspecified site: Secondary | ICD-10-CM | POA: Diagnosis not present

## 2020-09-23 DIAGNOSIS — M6281 Muscle weakness (generalized): Secondary | ICD-10-CM | POA: Diagnosis not present

## 2020-09-23 DIAGNOSIS — M25561 Pain in right knee: Secondary | ICD-10-CM | POA: Diagnosis not present

## 2020-09-23 DIAGNOSIS — F0391 Unspecified dementia with behavioral disturbance: Secondary | ICD-10-CM | POA: Diagnosis not present

## 2020-09-24 DIAGNOSIS — M25561 Pain in right knee: Secondary | ICD-10-CM | POA: Diagnosis not present

## 2020-09-24 DIAGNOSIS — F0391 Unspecified dementia with behavioral disturbance: Secondary | ICD-10-CM | POA: Diagnosis not present

## 2020-09-24 DIAGNOSIS — M1990 Unspecified osteoarthritis, unspecified site: Secondary | ICD-10-CM | POA: Diagnosis not present

## 2020-09-24 DIAGNOSIS — M6281 Muscle weakness (generalized): Secondary | ICD-10-CM | POA: Diagnosis not present

## 2020-09-27 DIAGNOSIS — M25561 Pain in right knee: Secondary | ICD-10-CM | POA: Diagnosis not present

## 2020-09-27 DIAGNOSIS — F0391 Unspecified dementia with behavioral disturbance: Secondary | ICD-10-CM | POA: Diagnosis not present

## 2020-09-27 DIAGNOSIS — M6281 Muscle weakness (generalized): Secondary | ICD-10-CM | POA: Diagnosis not present

## 2020-09-27 DIAGNOSIS — M1990 Unspecified osteoarthritis, unspecified site: Secondary | ICD-10-CM | POA: Diagnosis not present

## 2020-09-28 DIAGNOSIS — M6281 Muscle weakness (generalized): Secondary | ICD-10-CM | POA: Diagnosis not present

## 2020-09-28 DIAGNOSIS — M1990 Unspecified osteoarthritis, unspecified site: Secondary | ICD-10-CM | POA: Diagnosis not present

## 2020-09-28 DIAGNOSIS — M25561 Pain in right knee: Secondary | ICD-10-CM | POA: Diagnosis not present

## 2020-09-28 DIAGNOSIS — F0391 Unspecified dementia with behavioral disturbance: Secondary | ICD-10-CM | POA: Diagnosis not present

## 2020-09-29 DIAGNOSIS — M6281 Muscle weakness (generalized): Secondary | ICD-10-CM | POA: Diagnosis not present

## 2020-09-29 DIAGNOSIS — M1990 Unspecified osteoarthritis, unspecified site: Secondary | ICD-10-CM | POA: Diagnosis not present

## 2020-09-29 DIAGNOSIS — F0391 Unspecified dementia with behavioral disturbance: Secondary | ICD-10-CM | POA: Diagnosis not present

## 2020-09-29 DIAGNOSIS — M25561 Pain in right knee: Secondary | ICD-10-CM | POA: Diagnosis not present

## 2020-10-04 DIAGNOSIS — M1990 Unspecified osteoarthritis, unspecified site: Secondary | ICD-10-CM | POA: Diagnosis not present

## 2020-10-04 DIAGNOSIS — M6281 Muscle weakness (generalized): Secondary | ICD-10-CM | POA: Diagnosis not present

## 2020-10-04 DIAGNOSIS — M25561 Pain in right knee: Secondary | ICD-10-CM | POA: Diagnosis not present

## 2020-10-04 DIAGNOSIS — F0391 Unspecified dementia with behavioral disturbance: Secondary | ICD-10-CM | POA: Diagnosis not present

## 2020-10-07 DIAGNOSIS — F0391 Unspecified dementia with behavioral disturbance: Secondary | ICD-10-CM | POA: Diagnosis not present

## 2020-10-07 DIAGNOSIS — M25561 Pain in right knee: Secondary | ICD-10-CM | POA: Diagnosis not present

## 2020-10-07 DIAGNOSIS — M1990 Unspecified osteoarthritis, unspecified site: Secondary | ICD-10-CM | POA: Diagnosis not present

## 2020-10-07 DIAGNOSIS — M6281 Muscle weakness (generalized): Secondary | ICD-10-CM | POA: Diagnosis not present

## 2020-10-08 DIAGNOSIS — M6281 Muscle weakness (generalized): Secondary | ICD-10-CM | POA: Diagnosis not present

## 2020-10-08 DIAGNOSIS — M25561 Pain in right knee: Secondary | ICD-10-CM | POA: Diagnosis not present

## 2020-10-08 DIAGNOSIS — F0391 Unspecified dementia with behavioral disturbance: Secondary | ICD-10-CM | POA: Diagnosis not present

## 2020-10-08 DIAGNOSIS — M1990 Unspecified osteoarthritis, unspecified site: Secondary | ICD-10-CM | POA: Diagnosis not present

## 2020-10-11 DIAGNOSIS — F0391 Unspecified dementia with behavioral disturbance: Secondary | ICD-10-CM | POA: Diagnosis not present

## 2020-10-11 DIAGNOSIS — M25561 Pain in right knee: Secondary | ICD-10-CM | POA: Diagnosis not present

## 2020-10-11 DIAGNOSIS — M6281 Muscle weakness (generalized): Secondary | ICD-10-CM | POA: Diagnosis not present

## 2020-10-11 DIAGNOSIS — M1990 Unspecified osteoarthritis, unspecified site: Secondary | ICD-10-CM | POA: Diagnosis not present

## 2020-10-12 DIAGNOSIS — M1990 Unspecified osteoarthritis, unspecified site: Secondary | ICD-10-CM | POA: Diagnosis not present

## 2020-10-12 DIAGNOSIS — F0391 Unspecified dementia with behavioral disturbance: Secondary | ICD-10-CM | POA: Diagnosis not present

## 2020-10-12 DIAGNOSIS — M25561 Pain in right knee: Secondary | ICD-10-CM | POA: Diagnosis not present

## 2020-10-12 DIAGNOSIS — M6281 Muscle weakness (generalized): Secondary | ICD-10-CM | POA: Diagnosis not present

## 2020-10-13 DIAGNOSIS — M1990 Unspecified osteoarthritis, unspecified site: Secondary | ICD-10-CM | POA: Diagnosis not present

## 2020-10-13 DIAGNOSIS — M6281 Muscle weakness (generalized): Secondary | ICD-10-CM | POA: Diagnosis not present

## 2020-10-13 DIAGNOSIS — M25561 Pain in right knee: Secondary | ICD-10-CM | POA: Diagnosis not present

## 2020-10-13 DIAGNOSIS — F0391 Unspecified dementia with behavioral disturbance: Secondary | ICD-10-CM | POA: Diagnosis not present

## 2020-12-17 DIAGNOSIS — M6281 Muscle weakness (generalized): Secondary | ICD-10-CM | POA: Diagnosis not present

## 2020-12-17 DIAGNOSIS — F0391 Unspecified dementia with behavioral disturbance: Secondary | ICD-10-CM | POA: Diagnosis not present

## 2021-01-19 DIAGNOSIS — R1312 Dysphagia, oropharyngeal phase: Secondary | ICD-10-CM | POA: Diagnosis not present

## 2021-01-21 DIAGNOSIS — R1312 Dysphagia, oropharyngeal phase: Secondary | ICD-10-CM | POA: Diagnosis not present

## 2021-01-24 DIAGNOSIS — R1312 Dysphagia, oropharyngeal phase: Secondary | ICD-10-CM | POA: Diagnosis not present

## 2021-01-25 DIAGNOSIS — R1312 Dysphagia, oropharyngeal phase: Secondary | ICD-10-CM | POA: Diagnosis not present

## 2021-01-26 DIAGNOSIS — R1312 Dysphagia, oropharyngeal phase: Secondary | ICD-10-CM | POA: Diagnosis not present

## 2021-01-27 DIAGNOSIS — R1312 Dysphagia, oropharyngeal phase: Secondary | ICD-10-CM | POA: Diagnosis not present

## 2021-01-28 DIAGNOSIS — R1312 Dysphagia, oropharyngeal phase: Secondary | ICD-10-CM | POA: Diagnosis not present

## 2021-01-31 DIAGNOSIS — R1312 Dysphagia, oropharyngeal phase: Secondary | ICD-10-CM | POA: Diagnosis not present

## 2021-02-01 DIAGNOSIS — R1312 Dysphagia, oropharyngeal phase: Secondary | ICD-10-CM | POA: Diagnosis not present

## 2021-02-02 DIAGNOSIS — R1312 Dysphagia, oropharyngeal phase: Secondary | ICD-10-CM | POA: Diagnosis not present

## 2021-04-20 DIAGNOSIS — F0391 Unspecified dementia with behavioral disturbance: Secondary | ICD-10-CM | POA: Diagnosis not present

## 2021-04-20 DIAGNOSIS — M25561 Pain in right knee: Secondary | ICD-10-CM | POA: Diagnosis not present

## 2021-04-20 DIAGNOSIS — R635 Abnormal weight gain: Secondary | ICD-10-CM | POA: Diagnosis not present

## 2021-04-20 DIAGNOSIS — R262 Difficulty in walking, not elsewhere classified: Secondary | ICD-10-CM | POA: Diagnosis not present

## 2021-04-20 DIAGNOSIS — M1990 Unspecified osteoarthritis, unspecified site: Secondary | ICD-10-CM | POA: Diagnosis not present

## 2021-04-22 DIAGNOSIS — F0391 Unspecified dementia with behavioral disturbance: Secondary | ICD-10-CM | POA: Diagnosis not present

## 2021-04-22 DIAGNOSIS — R635 Abnormal weight gain: Secondary | ICD-10-CM | POA: Diagnosis not present

## 2021-04-22 DIAGNOSIS — M1990 Unspecified osteoarthritis, unspecified site: Secondary | ICD-10-CM | POA: Diagnosis not present

## 2021-04-22 DIAGNOSIS — M25561 Pain in right knee: Secondary | ICD-10-CM | POA: Diagnosis not present

## 2021-04-22 DIAGNOSIS — R262 Difficulty in walking, not elsewhere classified: Secondary | ICD-10-CM | POA: Diagnosis not present

## 2021-04-25 DIAGNOSIS — R262 Difficulty in walking, not elsewhere classified: Secondary | ICD-10-CM | POA: Diagnosis not present

## 2021-04-25 DIAGNOSIS — M1990 Unspecified osteoarthritis, unspecified site: Secondary | ICD-10-CM | POA: Diagnosis not present

## 2021-04-25 DIAGNOSIS — M25561 Pain in right knee: Secondary | ICD-10-CM | POA: Diagnosis not present

## 2021-04-25 DIAGNOSIS — F0391 Unspecified dementia with behavioral disturbance: Secondary | ICD-10-CM | POA: Diagnosis not present

## 2021-04-25 DIAGNOSIS — R635 Abnormal weight gain: Secondary | ICD-10-CM | POA: Diagnosis not present

## 2021-04-26 DIAGNOSIS — R635 Abnormal weight gain: Secondary | ICD-10-CM | POA: Diagnosis not present

## 2021-04-26 DIAGNOSIS — F0391 Unspecified dementia with behavioral disturbance: Secondary | ICD-10-CM | POA: Diagnosis not present

## 2021-04-26 DIAGNOSIS — R262 Difficulty in walking, not elsewhere classified: Secondary | ICD-10-CM | POA: Diagnosis not present

## 2021-04-26 DIAGNOSIS — M1990 Unspecified osteoarthritis, unspecified site: Secondary | ICD-10-CM | POA: Diagnosis not present

## 2021-04-26 DIAGNOSIS — M25561 Pain in right knee: Secondary | ICD-10-CM | POA: Diagnosis not present

## 2021-04-27 DIAGNOSIS — M1990 Unspecified osteoarthritis, unspecified site: Secondary | ICD-10-CM | POA: Diagnosis not present

## 2021-04-27 DIAGNOSIS — R635 Abnormal weight gain: Secondary | ICD-10-CM | POA: Diagnosis not present

## 2021-04-27 DIAGNOSIS — F0391 Unspecified dementia with behavioral disturbance: Secondary | ICD-10-CM | POA: Diagnosis not present

## 2021-04-27 DIAGNOSIS — R262 Difficulty in walking, not elsewhere classified: Secondary | ICD-10-CM | POA: Diagnosis not present

## 2021-04-27 DIAGNOSIS — M25561 Pain in right knee: Secondary | ICD-10-CM | POA: Diagnosis not present

## 2021-04-28 DIAGNOSIS — M1990 Unspecified osteoarthritis, unspecified site: Secondary | ICD-10-CM | POA: Diagnosis not present

## 2021-04-28 DIAGNOSIS — R635 Abnormal weight gain: Secondary | ICD-10-CM | POA: Diagnosis not present

## 2021-04-28 DIAGNOSIS — M25561 Pain in right knee: Secondary | ICD-10-CM | POA: Diagnosis not present

## 2021-04-28 DIAGNOSIS — R262 Difficulty in walking, not elsewhere classified: Secondary | ICD-10-CM | POA: Diagnosis not present

## 2021-04-28 DIAGNOSIS — F0391 Unspecified dementia with behavioral disturbance: Secondary | ICD-10-CM | POA: Diagnosis not present

## 2021-04-29 DIAGNOSIS — M1990 Unspecified osteoarthritis, unspecified site: Secondary | ICD-10-CM | POA: Diagnosis not present

## 2021-04-29 DIAGNOSIS — F0391 Unspecified dementia with behavioral disturbance: Secondary | ICD-10-CM | POA: Diagnosis not present

## 2021-04-29 DIAGNOSIS — R635 Abnormal weight gain: Secondary | ICD-10-CM | POA: Diagnosis not present

## 2021-04-29 DIAGNOSIS — M25561 Pain in right knee: Secondary | ICD-10-CM | POA: Diagnosis not present

## 2021-04-29 DIAGNOSIS — R262 Difficulty in walking, not elsewhere classified: Secondary | ICD-10-CM | POA: Diagnosis not present

## 2021-05-02 DIAGNOSIS — F0391 Unspecified dementia with behavioral disturbance: Secondary | ICD-10-CM | POA: Diagnosis not present

## 2021-05-02 DIAGNOSIS — M25561 Pain in right knee: Secondary | ICD-10-CM | POA: Diagnosis not present

## 2021-05-02 DIAGNOSIS — M1990 Unspecified osteoarthritis, unspecified site: Secondary | ICD-10-CM | POA: Diagnosis not present

## 2021-05-02 DIAGNOSIS — R262 Difficulty in walking, not elsewhere classified: Secondary | ICD-10-CM | POA: Diagnosis not present

## 2021-05-02 DIAGNOSIS — R635 Abnormal weight gain: Secondary | ICD-10-CM | POA: Diagnosis not present

## 2021-05-03 DIAGNOSIS — F0391 Unspecified dementia with behavioral disturbance: Secondary | ICD-10-CM | POA: Diagnosis not present

## 2021-05-03 DIAGNOSIS — R262 Difficulty in walking, not elsewhere classified: Secondary | ICD-10-CM | POA: Diagnosis not present

## 2021-05-03 DIAGNOSIS — R635 Abnormal weight gain: Secondary | ICD-10-CM | POA: Diagnosis not present

## 2021-05-03 DIAGNOSIS — M1990 Unspecified osteoarthritis, unspecified site: Secondary | ICD-10-CM | POA: Diagnosis not present

## 2021-05-03 DIAGNOSIS — M25561 Pain in right knee: Secondary | ICD-10-CM | POA: Diagnosis not present

## 2021-05-04 DIAGNOSIS — M25561 Pain in right knee: Secondary | ICD-10-CM | POA: Diagnosis not present

## 2021-05-04 DIAGNOSIS — R262 Difficulty in walking, not elsewhere classified: Secondary | ICD-10-CM | POA: Diagnosis not present

## 2021-05-04 DIAGNOSIS — M1990 Unspecified osteoarthritis, unspecified site: Secondary | ICD-10-CM | POA: Diagnosis not present

## 2021-05-04 DIAGNOSIS — F0391 Unspecified dementia with behavioral disturbance: Secondary | ICD-10-CM | POA: Diagnosis not present

## 2021-05-04 DIAGNOSIS — R635 Abnormal weight gain: Secondary | ICD-10-CM | POA: Diagnosis not present

## 2021-05-05 DIAGNOSIS — M25561 Pain in right knee: Secondary | ICD-10-CM | POA: Diagnosis not present

## 2021-05-05 DIAGNOSIS — R635 Abnormal weight gain: Secondary | ICD-10-CM | POA: Diagnosis not present

## 2021-05-05 DIAGNOSIS — M1990 Unspecified osteoarthritis, unspecified site: Secondary | ICD-10-CM | POA: Diagnosis not present

## 2021-05-05 DIAGNOSIS — F0391 Unspecified dementia with behavioral disturbance: Secondary | ICD-10-CM | POA: Diagnosis not present

## 2021-05-05 DIAGNOSIS — R262 Difficulty in walking, not elsewhere classified: Secondary | ICD-10-CM | POA: Diagnosis not present

## 2021-05-06 DIAGNOSIS — R262 Difficulty in walking, not elsewhere classified: Secondary | ICD-10-CM | POA: Diagnosis not present

## 2021-05-06 DIAGNOSIS — M1990 Unspecified osteoarthritis, unspecified site: Secondary | ICD-10-CM | POA: Diagnosis not present

## 2021-05-06 DIAGNOSIS — M25561 Pain in right knee: Secondary | ICD-10-CM | POA: Diagnosis not present

## 2021-05-06 DIAGNOSIS — F0391 Unspecified dementia with behavioral disturbance: Secondary | ICD-10-CM | POA: Diagnosis not present

## 2021-05-06 DIAGNOSIS — R635 Abnormal weight gain: Secondary | ICD-10-CM | POA: Diagnosis not present

## 2021-05-09 DIAGNOSIS — F0391 Unspecified dementia with behavioral disturbance: Secondary | ICD-10-CM | POA: Diagnosis not present

## 2021-05-09 DIAGNOSIS — R262 Difficulty in walking, not elsewhere classified: Secondary | ICD-10-CM | POA: Diagnosis not present

## 2021-05-09 DIAGNOSIS — R635 Abnormal weight gain: Secondary | ICD-10-CM | POA: Diagnosis not present

## 2021-05-09 DIAGNOSIS — M25561 Pain in right knee: Secondary | ICD-10-CM | POA: Diagnosis not present

## 2021-05-09 DIAGNOSIS — M1990 Unspecified osteoarthritis, unspecified site: Secondary | ICD-10-CM | POA: Diagnosis not present

## 2021-05-10 DIAGNOSIS — M25561 Pain in right knee: Secondary | ICD-10-CM | POA: Diagnosis not present

## 2021-05-10 DIAGNOSIS — R635 Abnormal weight gain: Secondary | ICD-10-CM | POA: Diagnosis not present

## 2021-05-10 DIAGNOSIS — F0391 Unspecified dementia with behavioral disturbance: Secondary | ICD-10-CM | POA: Diagnosis not present

## 2021-05-10 DIAGNOSIS — R262 Difficulty in walking, not elsewhere classified: Secondary | ICD-10-CM | POA: Diagnosis not present

## 2021-05-10 DIAGNOSIS — M1990 Unspecified osteoarthritis, unspecified site: Secondary | ICD-10-CM | POA: Diagnosis not present

## 2021-05-11 DIAGNOSIS — F0391 Unspecified dementia with behavioral disturbance: Secondary | ICD-10-CM | POA: Diagnosis not present

## 2021-05-11 DIAGNOSIS — M25561 Pain in right knee: Secondary | ICD-10-CM | POA: Diagnosis not present

## 2021-05-11 DIAGNOSIS — R635 Abnormal weight gain: Secondary | ICD-10-CM | POA: Diagnosis not present

## 2021-05-11 DIAGNOSIS — R262 Difficulty in walking, not elsewhere classified: Secondary | ICD-10-CM | POA: Diagnosis not present

## 2021-05-11 DIAGNOSIS — M1990 Unspecified osteoarthritis, unspecified site: Secondary | ICD-10-CM | POA: Diagnosis not present

## 2021-05-12 DIAGNOSIS — M25561 Pain in right knee: Secondary | ICD-10-CM | POA: Diagnosis not present

## 2021-05-12 DIAGNOSIS — F0391 Unspecified dementia with behavioral disturbance: Secondary | ICD-10-CM | POA: Diagnosis not present

## 2021-05-12 DIAGNOSIS — R262 Difficulty in walking, not elsewhere classified: Secondary | ICD-10-CM | POA: Diagnosis not present

## 2021-05-12 DIAGNOSIS — R635 Abnormal weight gain: Secondary | ICD-10-CM | POA: Diagnosis not present

## 2021-05-12 DIAGNOSIS — M1990 Unspecified osteoarthritis, unspecified site: Secondary | ICD-10-CM | POA: Diagnosis not present

## 2021-05-13 DIAGNOSIS — R635 Abnormal weight gain: Secondary | ICD-10-CM | POA: Diagnosis not present

## 2021-05-13 DIAGNOSIS — R262 Difficulty in walking, not elsewhere classified: Secondary | ICD-10-CM | POA: Diagnosis not present

## 2021-05-13 DIAGNOSIS — M25561 Pain in right knee: Secondary | ICD-10-CM | POA: Diagnosis not present

## 2021-05-13 DIAGNOSIS — M1990 Unspecified osteoarthritis, unspecified site: Secondary | ICD-10-CM | POA: Diagnosis not present

## 2021-05-13 DIAGNOSIS — F0391 Unspecified dementia with behavioral disturbance: Secondary | ICD-10-CM | POA: Diagnosis not present

## 2021-05-16 DIAGNOSIS — M1990 Unspecified osteoarthritis, unspecified site: Secondary | ICD-10-CM | POA: Diagnosis not present

## 2021-05-16 DIAGNOSIS — M25561 Pain in right knee: Secondary | ICD-10-CM | POA: Diagnosis not present

## 2021-05-16 DIAGNOSIS — F0391 Unspecified dementia with behavioral disturbance: Secondary | ICD-10-CM | POA: Diagnosis not present

## 2021-05-16 DIAGNOSIS — R262 Difficulty in walking, not elsewhere classified: Secondary | ICD-10-CM | POA: Diagnosis not present

## 2021-05-16 DIAGNOSIS — R635 Abnormal weight gain: Secondary | ICD-10-CM | POA: Diagnosis not present

## 2021-07-19 DIAGNOSIS — F0391 Unspecified dementia with behavioral disturbance: Secondary | ICD-10-CM | POA: Diagnosis not present

## 2021-07-19 DIAGNOSIS — R1312 Dysphagia, oropharyngeal phase: Secondary | ICD-10-CM | POA: Diagnosis not present

## 2021-07-20 DIAGNOSIS — F0391 Unspecified dementia with behavioral disturbance: Secondary | ICD-10-CM | POA: Diagnosis not present

## 2021-07-20 DIAGNOSIS — R1312 Dysphagia, oropharyngeal phase: Secondary | ICD-10-CM | POA: Diagnosis not present

## 2021-07-22 DIAGNOSIS — R1312 Dysphagia, oropharyngeal phase: Secondary | ICD-10-CM | POA: Diagnosis not present

## 2021-07-22 DIAGNOSIS — F0391 Unspecified dementia with behavioral disturbance: Secondary | ICD-10-CM | POA: Diagnosis not present

## 2021-07-23 DIAGNOSIS — F0390 Unspecified dementia without behavioral disturbance: Secondary | ICD-10-CM | POA: Diagnosis not present

## 2021-07-25 DIAGNOSIS — F0390 Unspecified dementia without behavioral disturbance: Secondary | ICD-10-CM | POA: Diagnosis not present

## 2021-07-26 DIAGNOSIS — F0390 Unspecified dementia without behavioral disturbance: Secondary | ICD-10-CM | POA: Diagnosis not present

## 2021-07-27 DIAGNOSIS — F0390 Unspecified dementia without behavioral disturbance: Secondary | ICD-10-CM | POA: Diagnosis not present

## 2021-07-28 DIAGNOSIS — F0390 Unspecified dementia without behavioral disturbance: Secondary | ICD-10-CM | POA: Diagnosis not present

## 2021-07-29 DIAGNOSIS — F0390 Unspecified dementia without behavioral disturbance: Secondary | ICD-10-CM | POA: Diagnosis not present

## 2021-08-01 DIAGNOSIS — F0390 Unspecified dementia without behavioral disturbance: Secondary | ICD-10-CM | POA: Diagnosis not present

## 2021-08-17 DIAGNOSIS — F0390 Unspecified dementia without behavioral disturbance: Secondary | ICD-10-CM | POA: Diagnosis not present

## 2021-10-11 DIAGNOSIS — F03B18 Unspecified dementia, moderate, with other behavioral disturbance: Secondary | ICD-10-CM | POA: Diagnosis not present

## 2021-10-12 DIAGNOSIS — F03B18 Unspecified dementia, moderate, with other behavioral disturbance: Secondary | ICD-10-CM | POA: Diagnosis not present

## 2021-10-13 DIAGNOSIS — F03B18 Unspecified dementia, moderate, with other behavioral disturbance: Secondary | ICD-10-CM | POA: Diagnosis not present

## 2021-10-14 DIAGNOSIS — F03B18 Unspecified dementia, moderate, with other behavioral disturbance: Secondary | ICD-10-CM | POA: Diagnosis not present

## 2021-10-17 DIAGNOSIS — F03B18 Unspecified dementia, moderate, with other behavioral disturbance: Secondary | ICD-10-CM | POA: Diagnosis not present

## 2021-10-18 DIAGNOSIS — F03B18 Unspecified dementia, moderate, with other behavioral disturbance: Secondary | ICD-10-CM | POA: Diagnosis not present

## 2021-10-19 DIAGNOSIS — F03B18 Unspecified dementia, moderate, with other behavioral disturbance: Secondary | ICD-10-CM | POA: Diagnosis not present

## 2021-10-20 DIAGNOSIS — F03B18 Unspecified dementia, moderate, with other behavioral disturbance: Secondary | ICD-10-CM | POA: Diagnosis not present

## 2021-10-21 DIAGNOSIS — F03B18 Unspecified dementia, moderate, with other behavioral disturbance: Secondary | ICD-10-CM | POA: Diagnosis not present

## 2021-10-24 DIAGNOSIS — F03B18 Unspecified dementia, moderate, with other behavioral disturbance: Secondary | ICD-10-CM | POA: Diagnosis not present

## 2021-10-25 DIAGNOSIS — F03B18 Unspecified dementia, moderate, with other behavioral disturbance: Secondary | ICD-10-CM | POA: Diagnosis not present

## 2021-10-26 DIAGNOSIS — F03B18 Unspecified dementia, moderate, with other behavioral disturbance: Secondary | ICD-10-CM | POA: Diagnosis not present

## 2021-10-27 DIAGNOSIS — F03B18 Unspecified dementia, moderate, with other behavioral disturbance: Secondary | ICD-10-CM | POA: Diagnosis not present

## 2021-10-28 DIAGNOSIS — F03B18 Unspecified dementia, moderate, with other behavioral disturbance: Secondary | ICD-10-CM | POA: Diagnosis not present

## 2021-10-31 DIAGNOSIS — F03B18 Unspecified dementia, moderate, with other behavioral disturbance: Secondary | ICD-10-CM | POA: Diagnosis not present

## 2021-11-01 DIAGNOSIS — F03B18 Unspecified dementia, moderate, with other behavioral disturbance: Secondary | ICD-10-CM | POA: Diagnosis not present

## 2021-11-02 DIAGNOSIS — F03B18 Unspecified dementia, moderate, with other behavioral disturbance: Secondary | ICD-10-CM | POA: Diagnosis not present

## 2021-11-03 DIAGNOSIS — F03B18 Unspecified dementia, moderate, with other behavioral disturbance: Secondary | ICD-10-CM | POA: Diagnosis not present

## 2021-11-04 DIAGNOSIS — F03B18 Unspecified dementia, moderate, with other behavioral disturbance: Secondary | ICD-10-CM | POA: Diagnosis not present

## 2021-11-08 DIAGNOSIS — F03B18 Unspecified dementia, moderate, with other behavioral disturbance: Secondary | ICD-10-CM | POA: Diagnosis not present

## 2021-11-09 DIAGNOSIS — F03B18 Unspecified dementia, moderate, with other behavioral disturbance: Secondary | ICD-10-CM | POA: Diagnosis not present

## 2021-11-10 DIAGNOSIS — F03B18 Unspecified dementia, moderate, with other behavioral disturbance: Secondary | ICD-10-CM | POA: Diagnosis not present

## 2021-11-11 DIAGNOSIS — F03B18 Unspecified dementia, moderate, with other behavioral disturbance: Secondary | ICD-10-CM | POA: Diagnosis not present

## 2021-11-12 DIAGNOSIS — F03B18 Unspecified dementia, moderate, with other behavioral disturbance: Secondary | ICD-10-CM | POA: Diagnosis not present

## 2021-11-14 DIAGNOSIS — F03B18 Unspecified dementia, moderate, with other behavioral disturbance: Secondary | ICD-10-CM | POA: Diagnosis not present

## 2021-11-15 DIAGNOSIS — F03B18 Unspecified dementia, moderate, with other behavioral disturbance: Secondary | ICD-10-CM | POA: Diagnosis not present

## 2021-11-16 DIAGNOSIS — F03B18 Unspecified dementia, moderate, with other behavioral disturbance: Secondary | ICD-10-CM | POA: Diagnosis not present

## 2022-01-25 DIAGNOSIS — F03B18 Unspecified dementia, moderate, with other behavioral disturbance: Secondary | ICD-10-CM | POA: Diagnosis not present

## 2022-01-26 DIAGNOSIS — F03B18 Unspecified dementia, moderate, with other behavioral disturbance: Secondary | ICD-10-CM | POA: Diagnosis not present

## 2022-01-27 DIAGNOSIS — F03B18 Unspecified dementia, moderate, with other behavioral disturbance: Secondary | ICD-10-CM | POA: Diagnosis not present

## 2022-01-30 DIAGNOSIS — F03B18 Unspecified dementia, moderate, with other behavioral disturbance: Secondary | ICD-10-CM | POA: Diagnosis not present

## 2022-01-31 DIAGNOSIS — F03B18 Unspecified dementia, moderate, with other behavioral disturbance: Secondary | ICD-10-CM | POA: Diagnosis not present

## 2022-02-01 DIAGNOSIS — F03B18 Unspecified dementia, moderate, with other behavioral disturbance: Secondary | ICD-10-CM | POA: Diagnosis not present

## 2022-02-02 DIAGNOSIS — F03B18 Unspecified dementia, moderate, with other behavioral disturbance: Secondary | ICD-10-CM | POA: Diagnosis not present

## 2022-02-03 DIAGNOSIS — F03B18 Unspecified dementia, moderate, with other behavioral disturbance: Secondary | ICD-10-CM | POA: Diagnosis not present

## 2022-02-06 DIAGNOSIS — F03B18 Unspecified dementia, moderate, with other behavioral disturbance: Secondary | ICD-10-CM | POA: Diagnosis not present

## 2022-02-07 DIAGNOSIS — F03B18 Unspecified dementia, moderate, with other behavioral disturbance: Secondary | ICD-10-CM | POA: Diagnosis not present

## 2022-02-08 DIAGNOSIS — F03B18 Unspecified dementia, moderate, with other behavioral disturbance: Secondary | ICD-10-CM | POA: Diagnosis not present

## 2022-02-09 DIAGNOSIS — F03B18 Unspecified dementia, moderate, with other behavioral disturbance: Secondary | ICD-10-CM | POA: Diagnosis not present

## 2022-02-10 DIAGNOSIS — F03B18 Unspecified dementia, moderate, with other behavioral disturbance: Secondary | ICD-10-CM | POA: Diagnosis not present

## 2022-02-13 DIAGNOSIS — F03B18 Unspecified dementia, moderate, with other behavioral disturbance: Secondary | ICD-10-CM | POA: Diagnosis not present

## 2022-02-14 DIAGNOSIS — F03B18 Unspecified dementia, moderate, with other behavioral disturbance: Secondary | ICD-10-CM | POA: Diagnosis not present

## 2022-02-15 DIAGNOSIS — F03B18 Unspecified dementia, moderate, with other behavioral disturbance: Secondary | ICD-10-CM | POA: Diagnosis not present

## 2022-02-16 DIAGNOSIS — F03B18 Unspecified dementia, moderate, with other behavioral disturbance: Secondary | ICD-10-CM | POA: Diagnosis not present

## 2022-02-17 DIAGNOSIS — F03B18 Unspecified dementia, moderate, with other behavioral disturbance: Secondary | ICD-10-CM | POA: Diagnosis not present

## 2022-02-20 DIAGNOSIS — F03B18 Unspecified dementia, moderate, with other behavioral disturbance: Secondary | ICD-10-CM | POA: Diagnosis not present

## 2022-02-21 DIAGNOSIS — F03B18 Unspecified dementia, moderate, with other behavioral disturbance: Secondary | ICD-10-CM | POA: Diagnosis not present

## 2022-02-22 DIAGNOSIS — F03B18 Unspecified dementia, moderate, with other behavioral disturbance: Secondary | ICD-10-CM | POA: Diagnosis not present

## 2022-02-23 DIAGNOSIS — F03B18 Unspecified dementia, moderate, with other behavioral disturbance: Secondary | ICD-10-CM | POA: Diagnosis not present

## 2022-02-24 DIAGNOSIS — F03B18 Unspecified dementia, moderate, with other behavioral disturbance: Secondary | ICD-10-CM | POA: Diagnosis not present

## 2022-02-27 DIAGNOSIS — F03B18 Unspecified dementia, moderate, with other behavioral disturbance: Secondary | ICD-10-CM | POA: Diagnosis not present

## 2022-02-28 DIAGNOSIS — F03B18 Unspecified dementia, moderate, with other behavioral disturbance: Secondary | ICD-10-CM | POA: Diagnosis not present

## 2022-03-27 DIAGNOSIS — F03B18 Unspecified dementia, moderate, with other behavioral disturbance: Secondary | ICD-10-CM | POA: Diagnosis not present

## 2022-03-27 DIAGNOSIS — R1311 Dysphagia, oral phase: Secondary | ICD-10-CM | POA: Diagnosis not present

## 2022-06-21 DIAGNOSIS — F03B18 Unspecified dementia, moderate, with other behavioral disturbance: Secondary | ICD-10-CM | POA: Diagnosis not present

## 2022-06-22 DIAGNOSIS — F03B18 Unspecified dementia, moderate, with other behavioral disturbance: Secondary | ICD-10-CM | POA: Diagnosis not present

## 2022-06-23 DIAGNOSIS — F03B18 Unspecified dementia, moderate, with other behavioral disturbance: Secondary | ICD-10-CM | POA: Diagnosis not present

## 2022-06-25 DIAGNOSIS — F03B18 Unspecified dementia, moderate, with other behavioral disturbance: Secondary | ICD-10-CM | POA: Diagnosis not present

## 2022-06-26 DIAGNOSIS — F03B18 Unspecified dementia, moderate, with other behavioral disturbance: Secondary | ICD-10-CM | POA: Diagnosis not present

## 2022-06-27 DIAGNOSIS — F03B18 Unspecified dementia, moderate, with other behavioral disturbance: Secondary | ICD-10-CM | POA: Diagnosis not present

## 2022-06-28 DIAGNOSIS — F03B18 Unspecified dementia, moderate, with other behavioral disturbance: Secondary | ICD-10-CM | POA: Diagnosis not present

## 2022-06-30 DIAGNOSIS — F03B18 Unspecified dementia, moderate, with other behavioral disturbance: Secondary | ICD-10-CM | POA: Diagnosis not present

## 2022-07-03 DIAGNOSIS — F03B18 Unspecified dementia, moderate, with other behavioral disturbance: Secondary | ICD-10-CM | POA: Diagnosis not present

## 2022-07-04 DIAGNOSIS — F03B18 Unspecified dementia, moderate, with other behavioral disturbance: Secondary | ICD-10-CM | POA: Diagnosis not present

## 2022-07-05 DIAGNOSIS — F03B18 Unspecified dementia, moderate, with other behavioral disturbance: Secondary | ICD-10-CM | POA: Diagnosis not present

## 2022-07-06 DIAGNOSIS — F03B18 Unspecified dementia, moderate, with other behavioral disturbance: Secondary | ICD-10-CM | POA: Diagnosis not present

## 2022-07-07 DIAGNOSIS — F03B18 Unspecified dementia, moderate, with other behavioral disturbance: Secondary | ICD-10-CM | POA: Diagnosis not present

## 2022-07-22 NOTE — Progress Notes (Signed)
No chief complaint on file.

## 2022-07-31 DIAGNOSIS — Z23 Encounter for immunization: Secondary | ICD-10-CM | POA: Diagnosis not present

## 2022-09-12 DIAGNOSIS — R262 Difficulty in walking, not elsewhere classified: Secondary | ICD-10-CM | POA: Diagnosis not present

## 2022-09-12 DIAGNOSIS — F03B18 Unspecified dementia, moderate, with other behavioral disturbance: Secondary | ICD-10-CM | POA: Diagnosis not present

## 2022-09-13 DIAGNOSIS — F03B18 Unspecified dementia, moderate, with other behavioral disturbance: Secondary | ICD-10-CM | POA: Diagnosis not present

## 2022-09-13 DIAGNOSIS — R262 Difficulty in walking, not elsewhere classified: Secondary | ICD-10-CM | POA: Diagnosis not present

## 2022-09-15 DIAGNOSIS — R262 Difficulty in walking, not elsewhere classified: Secondary | ICD-10-CM | POA: Diagnosis not present

## 2022-09-15 DIAGNOSIS — F03B18 Unspecified dementia, moderate, with other behavioral disturbance: Secondary | ICD-10-CM | POA: Diagnosis not present

## 2022-09-16 DIAGNOSIS — F03B18 Unspecified dementia, moderate, with other behavioral disturbance: Secondary | ICD-10-CM | POA: Diagnosis not present

## 2022-09-16 DIAGNOSIS — R262 Difficulty in walking, not elsewhere classified: Secondary | ICD-10-CM | POA: Diagnosis not present

## 2022-09-18 DIAGNOSIS — R262 Difficulty in walking, not elsewhere classified: Secondary | ICD-10-CM | POA: Diagnosis not present

## 2022-09-18 DIAGNOSIS — F03B18 Unspecified dementia, moderate, with other behavioral disturbance: Secondary | ICD-10-CM | POA: Diagnosis not present

## 2022-09-19 DIAGNOSIS — F03B18 Unspecified dementia, moderate, with other behavioral disturbance: Secondary | ICD-10-CM | POA: Diagnosis not present

## 2022-09-19 DIAGNOSIS — R262 Difficulty in walking, not elsewhere classified: Secondary | ICD-10-CM | POA: Diagnosis not present

## 2022-09-20 DIAGNOSIS — F03B18 Unspecified dementia, moderate, with other behavioral disturbance: Secondary | ICD-10-CM | POA: Diagnosis not present

## 2022-09-20 DIAGNOSIS — R262 Difficulty in walking, not elsewhere classified: Secondary | ICD-10-CM | POA: Diagnosis not present

## 2022-09-21 DIAGNOSIS — R262 Difficulty in walking, not elsewhere classified: Secondary | ICD-10-CM | POA: Diagnosis not present

## 2022-09-21 DIAGNOSIS — F03B18 Unspecified dementia, moderate, with other behavioral disturbance: Secondary | ICD-10-CM | POA: Diagnosis not present

## 2022-09-22 DIAGNOSIS — F03B18 Unspecified dementia, moderate, with other behavioral disturbance: Secondary | ICD-10-CM | POA: Diagnosis not present

## 2022-09-22 DIAGNOSIS — R262 Difficulty in walking, not elsewhere classified: Secondary | ICD-10-CM | POA: Diagnosis not present

## 2022-09-25 DIAGNOSIS — F03B18 Unspecified dementia, moderate, with other behavioral disturbance: Secondary | ICD-10-CM | POA: Diagnosis not present

## 2022-09-25 DIAGNOSIS — R262 Difficulty in walking, not elsewhere classified: Secondary | ICD-10-CM | POA: Diagnosis not present

## 2022-09-26 DIAGNOSIS — F03B18 Unspecified dementia, moderate, with other behavioral disturbance: Secondary | ICD-10-CM | POA: Diagnosis not present

## 2022-09-26 DIAGNOSIS — R262 Difficulty in walking, not elsewhere classified: Secondary | ICD-10-CM | POA: Diagnosis not present

## 2022-09-27 DIAGNOSIS — F03B18 Unspecified dementia, moderate, with other behavioral disturbance: Secondary | ICD-10-CM | POA: Diagnosis not present

## 2022-09-27 DIAGNOSIS — R262 Difficulty in walking, not elsewhere classified: Secondary | ICD-10-CM | POA: Diagnosis not present

## 2022-09-28 DIAGNOSIS — F03B18 Unspecified dementia, moderate, with other behavioral disturbance: Secondary | ICD-10-CM | POA: Diagnosis not present

## 2022-09-28 DIAGNOSIS — R262 Difficulty in walking, not elsewhere classified: Secondary | ICD-10-CM | POA: Diagnosis not present

## 2022-09-29 DIAGNOSIS — F03B18 Unspecified dementia, moderate, with other behavioral disturbance: Secondary | ICD-10-CM | POA: Diagnosis not present

## 2022-09-29 DIAGNOSIS — R262 Difficulty in walking, not elsewhere classified: Secondary | ICD-10-CM | POA: Diagnosis not present

## 2022-10-26 DIAGNOSIS — R262 Difficulty in walking, not elsewhere classified: Secondary | ICD-10-CM | POA: Diagnosis not present

## 2022-10-26 DIAGNOSIS — F03B18 Unspecified dementia, moderate, with other behavioral disturbance: Secondary | ICD-10-CM | POA: Diagnosis not present

## 2022-10-27 DIAGNOSIS — R262 Difficulty in walking, not elsewhere classified: Secondary | ICD-10-CM | POA: Diagnosis not present

## 2022-10-27 DIAGNOSIS — F03B18 Unspecified dementia, moderate, with other behavioral disturbance: Secondary | ICD-10-CM | POA: Diagnosis not present

## 2022-10-30 DIAGNOSIS — R262 Difficulty in walking, not elsewhere classified: Secondary | ICD-10-CM | POA: Diagnosis not present

## 2022-10-30 DIAGNOSIS — F03B18 Unspecified dementia, moderate, with other behavioral disturbance: Secondary | ICD-10-CM | POA: Diagnosis not present

## 2022-10-31 DIAGNOSIS — R262 Difficulty in walking, not elsewhere classified: Secondary | ICD-10-CM | POA: Diagnosis not present

## 2022-10-31 DIAGNOSIS — F03B18 Unspecified dementia, moderate, with other behavioral disturbance: Secondary | ICD-10-CM | POA: Diagnosis not present

## 2022-11-02 DIAGNOSIS — F03B18 Unspecified dementia, moderate, with other behavioral disturbance: Secondary | ICD-10-CM | POA: Diagnosis not present

## 2022-11-02 DIAGNOSIS — R262 Difficulty in walking, not elsewhere classified: Secondary | ICD-10-CM | POA: Diagnosis not present

## 2022-11-03 DIAGNOSIS — F03B18 Unspecified dementia, moderate, with other behavioral disturbance: Secondary | ICD-10-CM | POA: Diagnosis not present

## 2022-11-03 DIAGNOSIS — R262 Difficulty in walking, not elsewhere classified: Secondary | ICD-10-CM | POA: Diagnosis not present

## 2022-11-04 DIAGNOSIS — R262 Difficulty in walking, not elsewhere classified: Secondary | ICD-10-CM | POA: Diagnosis not present

## 2022-11-04 DIAGNOSIS — F03B18 Unspecified dementia, moderate, with other behavioral disturbance: Secondary | ICD-10-CM | POA: Diagnosis not present

## 2022-11-06 DIAGNOSIS — R262 Difficulty in walking, not elsewhere classified: Secondary | ICD-10-CM | POA: Diagnosis not present

## 2022-11-06 DIAGNOSIS — F03B18 Unspecified dementia, moderate, with other behavioral disturbance: Secondary | ICD-10-CM | POA: Diagnosis not present

## 2022-11-07 DIAGNOSIS — F03B18 Unspecified dementia, moderate, with other behavioral disturbance: Secondary | ICD-10-CM | POA: Diagnosis not present

## 2022-11-07 DIAGNOSIS — R262 Difficulty in walking, not elsewhere classified: Secondary | ICD-10-CM | POA: Diagnosis not present

## 2022-11-08 DIAGNOSIS — R262 Difficulty in walking, not elsewhere classified: Secondary | ICD-10-CM | POA: Diagnosis not present

## 2022-11-08 DIAGNOSIS — F03B18 Unspecified dementia, moderate, with other behavioral disturbance: Secondary | ICD-10-CM | POA: Diagnosis not present

## 2022-11-09 DIAGNOSIS — F03B18 Unspecified dementia, moderate, with other behavioral disturbance: Secondary | ICD-10-CM | POA: Diagnosis not present

## 2022-11-09 DIAGNOSIS — R262 Difficulty in walking, not elsewhere classified: Secondary | ICD-10-CM | POA: Diagnosis not present

## 2022-11-10 DIAGNOSIS — R262 Difficulty in walking, not elsewhere classified: Secondary | ICD-10-CM | POA: Diagnosis not present

## 2022-11-10 DIAGNOSIS — F03B18 Unspecified dementia, moderate, with other behavioral disturbance: Secondary | ICD-10-CM | POA: Diagnosis not present

## 2022-11-13 DIAGNOSIS — R262 Difficulty in walking, not elsewhere classified: Secondary | ICD-10-CM | POA: Diagnosis not present

## 2022-11-13 DIAGNOSIS — F03B18 Unspecified dementia, moderate, with other behavioral disturbance: Secondary | ICD-10-CM | POA: Diagnosis not present

## 2022-11-14 DIAGNOSIS — R262 Difficulty in walking, not elsewhere classified: Secondary | ICD-10-CM | POA: Diagnosis not present

## 2022-11-14 DIAGNOSIS — F03B18 Unspecified dementia, moderate, with other behavioral disturbance: Secondary | ICD-10-CM | POA: Diagnosis not present

## 2022-11-15 DIAGNOSIS — F03B18 Unspecified dementia, moderate, with other behavioral disturbance: Secondary | ICD-10-CM | POA: Diagnosis not present

## 2022-11-15 DIAGNOSIS — R262 Difficulty in walking, not elsewhere classified: Secondary | ICD-10-CM | POA: Diagnosis not present

## 2022-11-16 DIAGNOSIS — R262 Difficulty in walking, not elsewhere classified: Secondary | ICD-10-CM | POA: Diagnosis not present

## 2022-11-16 DIAGNOSIS — F03B18 Unspecified dementia, moderate, with other behavioral disturbance: Secondary | ICD-10-CM | POA: Diagnosis not present

## 2022-11-17 DIAGNOSIS — F03B18 Unspecified dementia, moderate, with other behavioral disturbance: Secondary | ICD-10-CM | POA: Diagnosis not present

## 2022-11-17 DIAGNOSIS — R262 Difficulty in walking, not elsewhere classified: Secondary | ICD-10-CM | POA: Diagnosis not present

## 2023-01-16 DIAGNOSIS — F03B18 Unspecified dementia, moderate, with other behavioral disturbance: Secondary | ICD-10-CM | POA: Diagnosis not present

## 2023-01-16 DIAGNOSIS — R262 Difficulty in walking, not elsewhere classified: Secondary | ICD-10-CM | POA: Diagnosis not present

## 2023-01-17 DIAGNOSIS — R262 Difficulty in walking, not elsewhere classified: Secondary | ICD-10-CM | POA: Diagnosis not present

## 2023-01-17 DIAGNOSIS — F03B18 Unspecified dementia, moderate, with other behavioral disturbance: Secondary | ICD-10-CM | POA: Diagnosis not present

## 2023-01-18 DIAGNOSIS — F03B18 Unspecified dementia, moderate, with other behavioral disturbance: Secondary | ICD-10-CM | POA: Diagnosis not present

## 2023-01-18 DIAGNOSIS — R262 Difficulty in walking, not elsewhere classified: Secondary | ICD-10-CM | POA: Diagnosis not present

## 2023-01-19 DIAGNOSIS — R262 Difficulty in walking, not elsewhere classified: Secondary | ICD-10-CM | POA: Diagnosis not present

## 2023-01-19 DIAGNOSIS — F03B18 Unspecified dementia, moderate, with other behavioral disturbance: Secondary | ICD-10-CM | POA: Diagnosis not present

## 2023-01-22 DIAGNOSIS — F03B18 Unspecified dementia, moderate, with other behavioral disturbance: Secondary | ICD-10-CM | POA: Diagnosis not present

## 2023-01-22 DIAGNOSIS — R262 Difficulty in walking, not elsewhere classified: Secondary | ICD-10-CM | POA: Diagnosis not present

## 2023-01-23 DIAGNOSIS — F03B18 Unspecified dementia, moderate, with other behavioral disturbance: Secondary | ICD-10-CM | POA: Diagnosis not present

## 2023-01-23 DIAGNOSIS — R262 Difficulty in walking, not elsewhere classified: Secondary | ICD-10-CM | POA: Diagnosis not present

## 2023-01-24 DIAGNOSIS — F03B18 Unspecified dementia, moderate, with other behavioral disturbance: Secondary | ICD-10-CM | POA: Diagnosis not present

## 2023-01-24 DIAGNOSIS — R262 Difficulty in walking, not elsewhere classified: Secondary | ICD-10-CM | POA: Diagnosis not present

## 2023-01-25 DIAGNOSIS — F03B18 Unspecified dementia, moderate, with other behavioral disturbance: Secondary | ICD-10-CM | POA: Diagnosis not present

## 2023-01-25 DIAGNOSIS — R262 Difficulty in walking, not elsewhere classified: Secondary | ICD-10-CM | POA: Diagnosis not present

## 2023-01-26 DIAGNOSIS — F03B18 Unspecified dementia, moderate, with other behavioral disturbance: Secondary | ICD-10-CM | POA: Diagnosis not present

## 2023-01-26 DIAGNOSIS — R262 Difficulty in walking, not elsewhere classified: Secondary | ICD-10-CM | POA: Diagnosis not present

## 2023-01-29 DIAGNOSIS — F03B18 Unspecified dementia, moderate, with other behavioral disturbance: Secondary | ICD-10-CM | POA: Diagnosis not present

## 2023-01-29 DIAGNOSIS — R262 Difficulty in walking, not elsewhere classified: Secondary | ICD-10-CM | POA: Diagnosis not present

## 2023-01-30 DIAGNOSIS — F03B18 Unspecified dementia, moderate, with other behavioral disturbance: Secondary | ICD-10-CM | POA: Diagnosis not present

## 2023-01-30 DIAGNOSIS — R262 Difficulty in walking, not elsewhere classified: Secondary | ICD-10-CM | POA: Diagnosis not present

## 2023-01-31 DIAGNOSIS — F03B18 Unspecified dementia, moderate, with other behavioral disturbance: Secondary | ICD-10-CM | POA: Diagnosis not present

## 2023-01-31 DIAGNOSIS — R262 Difficulty in walking, not elsewhere classified: Secondary | ICD-10-CM | POA: Diagnosis not present

## 2023-02-01 DIAGNOSIS — R262 Difficulty in walking, not elsewhere classified: Secondary | ICD-10-CM | POA: Diagnosis not present

## 2023-02-01 DIAGNOSIS — F03B18 Unspecified dementia, moderate, with other behavioral disturbance: Secondary | ICD-10-CM | POA: Diagnosis not present

## 2023-02-02 DIAGNOSIS — F03B18 Unspecified dementia, moderate, with other behavioral disturbance: Secondary | ICD-10-CM | POA: Diagnosis not present

## 2023-02-02 DIAGNOSIS — R262 Difficulty in walking, not elsewhere classified: Secondary | ICD-10-CM | POA: Diagnosis not present

## 2023-04-04 DIAGNOSIS — F03B18 Unspecified dementia, moderate, with other behavioral disturbance: Secondary | ICD-10-CM | POA: Diagnosis not present

## 2023-04-04 DIAGNOSIS — M6281 Muscle weakness (generalized): Secondary | ICD-10-CM | POA: Diagnosis not present

## 2023-04-05 DIAGNOSIS — M6281 Muscle weakness (generalized): Secondary | ICD-10-CM | POA: Diagnosis not present

## 2023-04-05 DIAGNOSIS — F03B18 Unspecified dementia, moderate, with other behavioral disturbance: Secondary | ICD-10-CM | POA: Diagnosis not present

## 2023-04-06 DIAGNOSIS — F03B18 Unspecified dementia, moderate, with other behavioral disturbance: Secondary | ICD-10-CM | POA: Diagnosis not present

## 2023-04-06 DIAGNOSIS — M6281 Muscle weakness (generalized): Secondary | ICD-10-CM | POA: Diagnosis not present

## 2023-04-07 DIAGNOSIS — M6281 Muscle weakness (generalized): Secondary | ICD-10-CM | POA: Diagnosis not present

## 2023-04-07 DIAGNOSIS — F03B18 Unspecified dementia, moderate, with other behavioral disturbance: Secondary | ICD-10-CM | POA: Diagnosis not present

## 2023-04-08 DIAGNOSIS — M6281 Muscle weakness (generalized): Secondary | ICD-10-CM | POA: Diagnosis not present

## 2023-04-08 DIAGNOSIS — F03B18 Unspecified dementia, moderate, with other behavioral disturbance: Secondary | ICD-10-CM | POA: Diagnosis not present

## 2023-04-09 DIAGNOSIS — M6281 Muscle weakness (generalized): Secondary | ICD-10-CM | POA: Diagnosis not present

## 2023-04-09 DIAGNOSIS — F03B18 Unspecified dementia, moderate, with other behavioral disturbance: Secondary | ICD-10-CM | POA: Diagnosis not present

## 2023-04-10 DIAGNOSIS — F03B18 Unspecified dementia, moderate, with other behavioral disturbance: Secondary | ICD-10-CM | POA: Diagnosis not present

## 2023-04-10 DIAGNOSIS — M6281 Muscle weakness (generalized): Secondary | ICD-10-CM | POA: Diagnosis not present

## 2023-04-11 DIAGNOSIS — F03B18 Unspecified dementia, moderate, with other behavioral disturbance: Secondary | ICD-10-CM | POA: Diagnosis not present

## 2023-04-11 DIAGNOSIS — M6281 Muscle weakness (generalized): Secondary | ICD-10-CM | POA: Diagnosis not present

## 2023-04-14 DIAGNOSIS — M6281 Muscle weakness (generalized): Secondary | ICD-10-CM | POA: Diagnosis not present

## 2023-04-14 DIAGNOSIS — F03B18 Unspecified dementia, moderate, with other behavioral disturbance: Secondary | ICD-10-CM | POA: Diagnosis not present

## 2023-04-16 DIAGNOSIS — M6281 Muscle weakness (generalized): Secondary | ICD-10-CM | POA: Diagnosis not present

## 2023-04-16 DIAGNOSIS — F03B18 Unspecified dementia, moderate, with other behavioral disturbance: Secondary | ICD-10-CM | POA: Diagnosis not present

## 2023-04-17 DIAGNOSIS — F03B18 Unspecified dementia, moderate, with other behavioral disturbance: Secondary | ICD-10-CM | POA: Diagnosis not present

## 2023-04-17 DIAGNOSIS — M6281 Muscle weakness (generalized): Secondary | ICD-10-CM | POA: Diagnosis not present

## 2023-04-18 DIAGNOSIS — F03B18 Unspecified dementia, moderate, with other behavioral disturbance: Secondary | ICD-10-CM | POA: Diagnosis not present

## 2023-04-18 DIAGNOSIS — M6281 Muscle weakness (generalized): Secondary | ICD-10-CM | POA: Diagnosis not present

## 2023-05-25 DIAGNOSIS — M6281 Muscle weakness (generalized): Secondary | ICD-10-CM | POA: Diagnosis not present

## 2023-05-25 DIAGNOSIS — F03B18 Unspecified dementia, moderate, with other behavioral disturbance: Secondary | ICD-10-CM | POA: Diagnosis not present

## 2023-05-27 DIAGNOSIS — M6281 Muscle weakness (generalized): Secondary | ICD-10-CM | POA: Diagnosis not present

## 2023-05-27 DIAGNOSIS — F03B18 Unspecified dementia, moderate, with other behavioral disturbance: Secondary | ICD-10-CM | POA: Diagnosis not present

## 2023-05-28 DIAGNOSIS — M6281 Muscle weakness (generalized): Secondary | ICD-10-CM | POA: Diagnosis not present

## 2023-05-28 DIAGNOSIS — F03B18 Unspecified dementia, moderate, with other behavioral disturbance: Secondary | ICD-10-CM | POA: Diagnosis not present

## 2023-05-29 DIAGNOSIS — F03B18 Unspecified dementia, moderate, with other behavioral disturbance: Secondary | ICD-10-CM | POA: Diagnosis not present

## 2023-05-29 DIAGNOSIS — M6281 Muscle weakness (generalized): Secondary | ICD-10-CM | POA: Diagnosis not present

## 2023-05-30 DIAGNOSIS — M6281 Muscle weakness (generalized): Secondary | ICD-10-CM | POA: Diagnosis not present

## 2023-05-30 DIAGNOSIS — F03B18 Unspecified dementia, moderate, with other behavioral disturbance: Secondary | ICD-10-CM | POA: Diagnosis not present

## 2023-05-31 DIAGNOSIS — M6281 Muscle weakness (generalized): Secondary | ICD-10-CM | POA: Diagnosis not present

## 2023-05-31 DIAGNOSIS — F03B18 Unspecified dementia, moderate, with other behavioral disturbance: Secondary | ICD-10-CM | POA: Diagnosis not present

## 2023-06-04 DIAGNOSIS — M6281 Muscle weakness (generalized): Secondary | ICD-10-CM | POA: Diagnosis not present

## 2023-06-04 DIAGNOSIS — F03B18 Unspecified dementia, moderate, with other behavioral disturbance: Secondary | ICD-10-CM | POA: Diagnosis not present

## 2023-06-05 DIAGNOSIS — M6281 Muscle weakness (generalized): Secondary | ICD-10-CM | POA: Diagnosis not present

## 2023-06-05 DIAGNOSIS — F03B18 Unspecified dementia, moderate, with other behavioral disturbance: Secondary | ICD-10-CM | POA: Diagnosis not present

## 2023-06-06 DIAGNOSIS — M6281 Muscle weakness (generalized): Secondary | ICD-10-CM | POA: Diagnosis not present

## 2023-06-06 DIAGNOSIS — F03B18 Unspecified dementia, moderate, with other behavioral disturbance: Secondary | ICD-10-CM | POA: Diagnosis not present

## 2023-06-07 DIAGNOSIS — F03B18 Unspecified dementia, moderate, with other behavioral disturbance: Secondary | ICD-10-CM | POA: Diagnosis not present

## 2023-06-07 DIAGNOSIS — M6281 Muscle weakness (generalized): Secondary | ICD-10-CM | POA: Diagnosis not present

## 2023-06-08 DIAGNOSIS — F03B18 Unspecified dementia, moderate, with other behavioral disturbance: Secondary | ICD-10-CM | POA: Diagnosis not present

## 2023-06-08 DIAGNOSIS — M6281 Muscle weakness (generalized): Secondary | ICD-10-CM | POA: Diagnosis not present

## 2023-06-10 DIAGNOSIS — M6281 Muscle weakness (generalized): Secondary | ICD-10-CM | POA: Diagnosis not present

## 2023-06-10 DIAGNOSIS — F03B18 Unspecified dementia, moderate, with other behavioral disturbance: Secondary | ICD-10-CM | POA: Diagnosis not present

## 2023-06-11 DIAGNOSIS — M6281 Muscle weakness (generalized): Secondary | ICD-10-CM | POA: Diagnosis not present

## 2023-06-11 DIAGNOSIS — F03B18 Unspecified dementia, moderate, with other behavioral disturbance: Secondary | ICD-10-CM | POA: Diagnosis not present

## 2023-06-12 DIAGNOSIS — M6281 Muscle weakness (generalized): Secondary | ICD-10-CM | POA: Diagnosis not present

## 2023-06-12 DIAGNOSIS — F03B18 Unspecified dementia, moderate, with other behavioral disturbance: Secondary | ICD-10-CM | POA: Diagnosis not present

## 2023-08-17 DIAGNOSIS — M6281 Muscle weakness (generalized): Secondary | ICD-10-CM | POA: Diagnosis not present

## 2023-08-17 DIAGNOSIS — F03B18 Unspecified dementia, moderate, with other behavioral disturbance: Secondary | ICD-10-CM | POA: Diagnosis not present

## 2023-08-20 DIAGNOSIS — F03B18 Unspecified dementia, moderate, with other behavioral disturbance: Secondary | ICD-10-CM | POA: Diagnosis not present

## 2023-08-20 DIAGNOSIS — M6281 Muscle weakness (generalized): Secondary | ICD-10-CM | POA: Diagnosis not present

## 2023-08-21 DIAGNOSIS — M6281 Muscle weakness (generalized): Secondary | ICD-10-CM | POA: Diagnosis not present

## 2023-08-21 DIAGNOSIS — F03B18 Unspecified dementia, moderate, with other behavioral disturbance: Secondary | ICD-10-CM | POA: Diagnosis not present

## 2023-08-22 DIAGNOSIS — F03B18 Unspecified dementia, moderate, with other behavioral disturbance: Secondary | ICD-10-CM | POA: Diagnosis not present

## 2023-08-22 DIAGNOSIS — M6281 Muscle weakness (generalized): Secondary | ICD-10-CM | POA: Diagnosis not present

## 2023-08-23 DIAGNOSIS — M6281 Muscle weakness (generalized): Secondary | ICD-10-CM | POA: Diagnosis not present

## 2023-08-23 DIAGNOSIS — F03B18 Unspecified dementia, moderate, with other behavioral disturbance: Secondary | ICD-10-CM | POA: Diagnosis not present

## 2023-08-24 DIAGNOSIS — M6281 Muscle weakness (generalized): Secondary | ICD-10-CM | POA: Diagnosis not present

## 2023-08-24 DIAGNOSIS — F03B18 Unspecified dementia, moderate, with other behavioral disturbance: Secondary | ICD-10-CM | POA: Diagnosis not present

## 2023-08-27 DIAGNOSIS — F03B18 Unspecified dementia, moderate, with other behavioral disturbance: Secondary | ICD-10-CM | POA: Diagnosis not present

## 2023-08-27 DIAGNOSIS — M6281 Muscle weakness (generalized): Secondary | ICD-10-CM | POA: Diagnosis not present

## 2023-08-28 DIAGNOSIS — F03B18 Unspecified dementia, moderate, with other behavioral disturbance: Secondary | ICD-10-CM | POA: Diagnosis not present

## 2023-08-28 DIAGNOSIS — M6281 Muscle weakness (generalized): Secondary | ICD-10-CM | POA: Diagnosis not present

## 2023-08-29 DIAGNOSIS — M6281 Muscle weakness (generalized): Secondary | ICD-10-CM | POA: Diagnosis not present

## 2023-08-29 DIAGNOSIS — F03B18 Unspecified dementia, moderate, with other behavioral disturbance: Secondary | ICD-10-CM | POA: Diagnosis not present

## 2023-08-30 DIAGNOSIS — M6281 Muscle weakness (generalized): Secondary | ICD-10-CM | POA: Diagnosis not present

## 2023-08-30 DIAGNOSIS — F03B18 Unspecified dementia, moderate, with other behavioral disturbance: Secondary | ICD-10-CM | POA: Diagnosis not present

## 2023-09-01 DIAGNOSIS — M6281 Muscle weakness (generalized): Secondary | ICD-10-CM | POA: Diagnosis not present

## 2023-09-01 DIAGNOSIS — F03B18 Unspecified dementia, moderate, with other behavioral disturbance: Secondary | ICD-10-CM | POA: Diagnosis not present

## 2023-09-02 DIAGNOSIS — F03B18 Unspecified dementia, moderate, with other behavioral disturbance: Secondary | ICD-10-CM | POA: Diagnosis not present

## 2023-09-02 DIAGNOSIS — M6281 Muscle weakness (generalized): Secondary | ICD-10-CM | POA: Diagnosis not present

## 2023-09-03 DIAGNOSIS — M6281 Muscle weakness (generalized): Secondary | ICD-10-CM | POA: Diagnosis not present

## 2023-09-03 DIAGNOSIS — F03B18 Unspecified dementia, moderate, with other behavioral disturbance: Secondary | ICD-10-CM | POA: Diagnosis not present

## 2023-09-04 DIAGNOSIS — F03B18 Unspecified dementia, moderate, with other behavioral disturbance: Secondary | ICD-10-CM | POA: Diagnosis not present

## 2023-09-04 DIAGNOSIS — M6281 Muscle weakness (generalized): Secondary | ICD-10-CM | POA: Diagnosis not present

## 2023-09-06 DIAGNOSIS — M6281 Muscle weakness (generalized): Secondary | ICD-10-CM | POA: Diagnosis not present

## 2023-09-06 DIAGNOSIS — F03B18 Unspecified dementia, moderate, with other behavioral disturbance: Secondary | ICD-10-CM | POA: Diagnosis not present

## 2023-10-31 DIAGNOSIS — R1311 Dysphagia, oral phase: Secondary | ICD-10-CM | POA: Diagnosis not present

## 2023-10-31 DIAGNOSIS — F03B18 Unspecified dementia, moderate, with other behavioral disturbance: Secondary | ICD-10-CM | POA: Diagnosis not present

## 2023-11-01 DIAGNOSIS — F03B18 Unspecified dementia, moderate, with other behavioral disturbance: Secondary | ICD-10-CM | POA: Diagnosis not present

## 2023-11-01 DIAGNOSIS — R1311 Dysphagia, oral phase: Secondary | ICD-10-CM | POA: Diagnosis not present

## 2023-11-02 DIAGNOSIS — F03B18 Unspecified dementia, moderate, with other behavioral disturbance: Secondary | ICD-10-CM | POA: Diagnosis not present

## 2023-11-02 DIAGNOSIS — R1311 Dysphagia, oral phase: Secondary | ICD-10-CM | POA: Diagnosis not present

## 2023-11-05 DIAGNOSIS — F03B18 Unspecified dementia, moderate, with other behavioral disturbance: Secondary | ICD-10-CM | POA: Diagnosis not present

## 2023-11-05 DIAGNOSIS — R1311 Dysphagia, oral phase: Secondary | ICD-10-CM | POA: Diagnosis not present

## 2023-11-06 DIAGNOSIS — R1311 Dysphagia, oral phase: Secondary | ICD-10-CM | POA: Diagnosis not present

## 2023-11-06 DIAGNOSIS — F03B18 Unspecified dementia, moderate, with other behavioral disturbance: Secondary | ICD-10-CM | POA: Diagnosis not present

## 2023-11-07 DIAGNOSIS — R1311 Dysphagia, oral phase: Secondary | ICD-10-CM | POA: Diagnosis not present

## 2023-11-07 DIAGNOSIS — F03B18 Unspecified dementia, moderate, with other behavioral disturbance: Secondary | ICD-10-CM | POA: Diagnosis not present

## 2023-11-08 DIAGNOSIS — F03B18 Unspecified dementia, moderate, with other behavioral disturbance: Secondary | ICD-10-CM | POA: Diagnosis not present

## 2023-11-08 DIAGNOSIS — R1311 Dysphagia, oral phase: Secondary | ICD-10-CM | POA: Diagnosis not present

## 2023-11-09 DIAGNOSIS — R1311 Dysphagia, oral phase: Secondary | ICD-10-CM | POA: Diagnosis not present

## 2023-11-09 DIAGNOSIS — F03B18 Unspecified dementia, moderate, with other behavioral disturbance: Secondary | ICD-10-CM | POA: Diagnosis not present

## 2023-11-12 DIAGNOSIS — F03B18 Unspecified dementia, moderate, with other behavioral disturbance: Secondary | ICD-10-CM | POA: Diagnosis not present

## 2023-11-12 DIAGNOSIS — R1311 Dysphagia, oral phase: Secondary | ICD-10-CM | POA: Diagnosis not present

## 2023-11-13 DIAGNOSIS — R1311 Dysphagia, oral phase: Secondary | ICD-10-CM | POA: Diagnosis not present

## 2023-11-13 DIAGNOSIS — F03B18 Unspecified dementia, moderate, with other behavioral disturbance: Secondary | ICD-10-CM | POA: Diagnosis not present

## 2023-11-14 DIAGNOSIS — F03B18 Unspecified dementia, moderate, with other behavioral disturbance: Secondary | ICD-10-CM | POA: Diagnosis not present

## 2023-11-14 DIAGNOSIS — R1311 Dysphagia, oral phase: Secondary | ICD-10-CM | POA: Diagnosis not present

## 2023-11-15 DIAGNOSIS — R1311 Dysphagia, oral phase: Secondary | ICD-10-CM | POA: Diagnosis not present

## 2023-11-15 DIAGNOSIS — F03B18 Unspecified dementia, moderate, with other behavioral disturbance: Secondary | ICD-10-CM | POA: Diagnosis not present

## 2023-11-16 DIAGNOSIS — F03B18 Unspecified dementia, moderate, with other behavioral disturbance: Secondary | ICD-10-CM | POA: Diagnosis not present

## 2023-11-16 DIAGNOSIS — R1311 Dysphagia, oral phase: Secondary | ICD-10-CM | POA: Diagnosis not present

## 2024-01-16 DIAGNOSIS — F03B18 Unspecified dementia, moderate, with other behavioral disturbance: Secondary | ICD-10-CM | POA: Diagnosis not present

## 2024-01-16 DIAGNOSIS — M6281 Muscle weakness (generalized): Secondary | ICD-10-CM | POA: Diagnosis not present

## 2024-01-17 DIAGNOSIS — F03B18 Unspecified dementia, moderate, with other behavioral disturbance: Secondary | ICD-10-CM | POA: Diagnosis not present

## 2024-01-17 DIAGNOSIS — M6281 Muscle weakness (generalized): Secondary | ICD-10-CM | POA: Diagnosis not present

## 2024-01-18 DIAGNOSIS — F03B18 Unspecified dementia, moderate, with other behavioral disturbance: Secondary | ICD-10-CM | POA: Diagnosis not present

## 2024-01-18 DIAGNOSIS — M6281 Muscle weakness (generalized): Secondary | ICD-10-CM | POA: Diagnosis not present

## 2024-01-20 DIAGNOSIS — F03B18 Unspecified dementia, moderate, with other behavioral disturbance: Secondary | ICD-10-CM | POA: Diagnosis not present

## 2024-01-20 DIAGNOSIS — M6281 Muscle weakness (generalized): Secondary | ICD-10-CM | POA: Diagnosis not present

## 2024-01-21 DIAGNOSIS — F03B18 Unspecified dementia, moderate, with other behavioral disturbance: Secondary | ICD-10-CM | POA: Diagnosis not present

## 2024-01-21 DIAGNOSIS — M6281 Muscle weakness (generalized): Secondary | ICD-10-CM | POA: Diagnosis not present

## 2024-01-22 DIAGNOSIS — F03B18 Unspecified dementia, moderate, with other behavioral disturbance: Secondary | ICD-10-CM | POA: Diagnosis not present

## 2024-01-22 DIAGNOSIS — R1311 Dysphagia, oral phase: Secondary | ICD-10-CM | POA: Diagnosis not present

## 2024-01-22 DIAGNOSIS — M6281 Muscle weakness (generalized): Secondary | ICD-10-CM | POA: Diagnosis not present

## 2024-01-23 DIAGNOSIS — F03B18 Unspecified dementia, moderate, with other behavioral disturbance: Secondary | ICD-10-CM | POA: Diagnosis not present

## 2024-01-23 DIAGNOSIS — R1311 Dysphagia, oral phase: Secondary | ICD-10-CM | POA: Diagnosis not present

## 2024-01-23 DIAGNOSIS — M6281 Muscle weakness (generalized): Secondary | ICD-10-CM | POA: Diagnosis not present

## 2024-01-24 DIAGNOSIS — R1311 Dysphagia, oral phase: Secondary | ICD-10-CM | POA: Diagnosis not present

## 2024-01-24 DIAGNOSIS — F03B18 Unspecified dementia, moderate, with other behavioral disturbance: Secondary | ICD-10-CM | POA: Diagnosis not present

## 2024-01-24 DIAGNOSIS — M6281 Muscle weakness (generalized): Secondary | ICD-10-CM | POA: Diagnosis not present

## 2024-01-25 DIAGNOSIS — M6281 Muscle weakness (generalized): Secondary | ICD-10-CM | POA: Diagnosis not present

## 2024-01-25 DIAGNOSIS — R1311 Dysphagia, oral phase: Secondary | ICD-10-CM | POA: Diagnosis not present

## 2024-01-25 DIAGNOSIS — F03B18 Unspecified dementia, moderate, with other behavioral disturbance: Secondary | ICD-10-CM | POA: Diagnosis not present

## 2024-01-27 DIAGNOSIS — R1311 Dysphagia, oral phase: Secondary | ICD-10-CM | POA: Diagnosis not present

## 2024-01-27 DIAGNOSIS — M6281 Muscle weakness (generalized): Secondary | ICD-10-CM | POA: Diagnosis not present

## 2024-01-27 DIAGNOSIS — F03B18 Unspecified dementia, moderate, with other behavioral disturbance: Secondary | ICD-10-CM | POA: Diagnosis not present

## 2024-01-28 DIAGNOSIS — R1311 Dysphagia, oral phase: Secondary | ICD-10-CM | POA: Diagnosis not present

## 2024-01-28 DIAGNOSIS — M6281 Muscle weakness (generalized): Secondary | ICD-10-CM | POA: Diagnosis not present

## 2024-01-28 DIAGNOSIS — F03B18 Unspecified dementia, moderate, with other behavioral disturbance: Secondary | ICD-10-CM | POA: Diagnosis not present

## 2024-01-29 DIAGNOSIS — F03B18 Unspecified dementia, moderate, with other behavioral disturbance: Secondary | ICD-10-CM | POA: Diagnosis not present

## 2024-01-29 DIAGNOSIS — M6281 Muscle weakness (generalized): Secondary | ICD-10-CM | POA: Diagnosis not present

## 2024-01-29 DIAGNOSIS — R1311 Dysphagia, oral phase: Secondary | ICD-10-CM | POA: Diagnosis not present

## 2024-01-31 DIAGNOSIS — F03B18 Unspecified dementia, moderate, with other behavioral disturbance: Secondary | ICD-10-CM | POA: Diagnosis not present

## 2024-01-31 DIAGNOSIS — R1311 Dysphagia, oral phase: Secondary | ICD-10-CM | POA: Diagnosis not present

## 2024-01-31 DIAGNOSIS — M6281 Muscle weakness (generalized): Secondary | ICD-10-CM | POA: Diagnosis not present

## 2024-02-07 DIAGNOSIS — R1311 Dysphagia, oral phase: Secondary | ICD-10-CM | POA: Diagnosis not present

## 2024-02-07 DIAGNOSIS — F03B18 Unspecified dementia, moderate, with other behavioral disturbance: Secondary | ICD-10-CM | POA: Diagnosis not present

## 2024-02-07 DIAGNOSIS — M6281 Muscle weakness (generalized): Secondary | ICD-10-CM | POA: Diagnosis not present

## 2024-02-08 DIAGNOSIS — M6281 Muscle weakness (generalized): Secondary | ICD-10-CM | POA: Diagnosis not present

## 2024-02-08 DIAGNOSIS — F03B18 Unspecified dementia, moderate, with other behavioral disturbance: Secondary | ICD-10-CM | POA: Diagnosis not present

## 2024-02-08 DIAGNOSIS — R1311 Dysphagia, oral phase: Secondary | ICD-10-CM | POA: Diagnosis not present

## 2024-02-14 DIAGNOSIS — M6281 Muscle weakness (generalized): Secondary | ICD-10-CM | POA: Diagnosis not present

## 2024-02-14 DIAGNOSIS — R1311 Dysphagia, oral phase: Secondary | ICD-10-CM | POA: Diagnosis not present

## 2024-02-14 DIAGNOSIS — F03B18 Unspecified dementia, moderate, with other behavioral disturbance: Secondary | ICD-10-CM | POA: Diagnosis not present

## 2024-02-15 DIAGNOSIS — F03B18 Unspecified dementia, moderate, with other behavioral disturbance: Secondary | ICD-10-CM | POA: Diagnosis not present

## 2024-02-15 DIAGNOSIS — R1311 Dysphagia, oral phase: Secondary | ICD-10-CM | POA: Diagnosis not present

## 2024-02-15 DIAGNOSIS — M6281 Muscle weakness (generalized): Secondary | ICD-10-CM | POA: Diagnosis not present

## 2024-03-06 DIAGNOSIS — M6281 Muscle weakness (generalized): Secondary | ICD-10-CM | POA: Diagnosis not present

## 2024-03-06 DIAGNOSIS — R1311 Dysphagia, oral phase: Secondary | ICD-10-CM | POA: Diagnosis not present

## 2024-03-06 DIAGNOSIS — F03B18 Unspecified dementia, moderate, with other behavioral disturbance: Secondary | ICD-10-CM | POA: Diagnosis not present

## 2024-04-09 DIAGNOSIS — R269 Unspecified abnormalities of gait and mobility: Secondary | ICD-10-CM | POA: Diagnosis not present

## 2024-04-09 DIAGNOSIS — R296 Repeated falls: Secondary | ICD-10-CM | POA: Diagnosis not present

## 2024-04-09 DIAGNOSIS — R2689 Other abnormalities of gait and mobility: Secondary | ICD-10-CM | POA: Diagnosis not present

## 2024-04-09 DIAGNOSIS — F03B18 Unspecified dementia, moderate, with other behavioral disturbance: Secondary | ICD-10-CM | POA: Diagnosis not present

## 2024-04-09 DIAGNOSIS — M6281 Muscle weakness (generalized): Secondary | ICD-10-CM | POA: Diagnosis not present

## 2024-04-10 DIAGNOSIS — R269 Unspecified abnormalities of gait and mobility: Secondary | ICD-10-CM | POA: Diagnosis not present

## 2024-04-10 DIAGNOSIS — R296 Repeated falls: Secondary | ICD-10-CM | POA: Diagnosis not present

## 2024-04-10 DIAGNOSIS — F03B18 Unspecified dementia, moderate, with other behavioral disturbance: Secondary | ICD-10-CM | POA: Diagnosis not present

## 2024-04-10 DIAGNOSIS — R2689 Other abnormalities of gait and mobility: Secondary | ICD-10-CM | POA: Diagnosis not present

## 2024-04-10 DIAGNOSIS — M6281 Muscle weakness (generalized): Secondary | ICD-10-CM | POA: Diagnosis not present

## 2024-04-11 DIAGNOSIS — M6281 Muscle weakness (generalized): Secondary | ICD-10-CM | POA: Diagnosis not present

## 2024-04-11 DIAGNOSIS — R296 Repeated falls: Secondary | ICD-10-CM | POA: Diagnosis not present

## 2024-04-11 DIAGNOSIS — F03B18 Unspecified dementia, moderate, with other behavioral disturbance: Secondary | ICD-10-CM | POA: Diagnosis not present

## 2024-04-11 DIAGNOSIS — R2689 Other abnormalities of gait and mobility: Secondary | ICD-10-CM | POA: Diagnosis not present

## 2024-04-11 DIAGNOSIS — R269 Unspecified abnormalities of gait and mobility: Secondary | ICD-10-CM | POA: Diagnosis not present

## 2024-04-12 DIAGNOSIS — M6281 Muscle weakness (generalized): Secondary | ICD-10-CM | POA: Diagnosis not present

## 2024-04-12 DIAGNOSIS — R269 Unspecified abnormalities of gait and mobility: Secondary | ICD-10-CM | POA: Diagnosis not present

## 2024-04-12 DIAGNOSIS — R2689 Other abnormalities of gait and mobility: Secondary | ICD-10-CM | POA: Diagnosis not present

## 2024-04-12 DIAGNOSIS — F03B18 Unspecified dementia, moderate, with other behavioral disturbance: Secondary | ICD-10-CM | POA: Diagnosis not present

## 2024-04-12 DIAGNOSIS — R296 Repeated falls: Secondary | ICD-10-CM | POA: Diagnosis not present

## 2024-04-13 ENCOUNTER — Emergency Department (HOSPITAL_COMMUNITY)

## 2024-04-13 ENCOUNTER — Other Ambulatory Visit: Payer: Self-pay

## 2024-04-13 ENCOUNTER — Encounter (HOSPITAL_COMMUNITY): Payer: Self-pay | Admitting: Emergency Medicine

## 2024-04-13 ENCOUNTER — Inpatient Hospital Stay (HOSPITAL_COMMUNITY)
Admission: EM | Admit: 2024-04-13 | Discharge: 2024-04-16 | DRG: 392 | Disposition: A | Discharge status: Skilled Nursing Facility | Source: Skilled Nursing Facility | Attending: Internal Medicine | Admitting: Internal Medicine

## 2024-04-13 DIAGNOSIS — I447 Left bundle-branch block, unspecified: Secondary | ICD-10-CM | POA: Diagnosis present

## 2024-04-13 DIAGNOSIS — N1832 Chronic kidney disease, stage 3b: Secondary | ICD-10-CM | POA: Diagnosis present

## 2024-04-13 DIAGNOSIS — Z800 Family history of malignant neoplasm of digestive organs: Secondary | ICD-10-CM | POA: Diagnosis not present

## 2024-04-13 DIAGNOSIS — R011 Cardiac murmur, unspecified: Secondary | ICD-10-CM | POA: Diagnosis present

## 2024-04-13 DIAGNOSIS — E66812 Obesity, class 2: Secondary | ICD-10-CM | POA: Diagnosis present

## 2024-04-13 DIAGNOSIS — E785 Hyperlipidemia, unspecified: Secondary | ICD-10-CM | POA: Diagnosis present

## 2024-04-13 DIAGNOSIS — Z79899 Other long term (current) drug therapy: Secondary | ICD-10-CM

## 2024-04-13 DIAGNOSIS — I129 Hypertensive chronic kidney disease with stage 1 through stage 4 chronic kidney disease, or unspecified chronic kidney disease: Secondary | ICD-10-CM | POA: Diagnosis present

## 2024-04-13 DIAGNOSIS — R7989 Other specified abnormal findings of blood chemistry: Secondary | ICD-10-CM | POA: Diagnosis present

## 2024-04-13 DIAGNOSIS — Z96652 Presence of left artificial knee joint: Secondary | ICD-10-CM | POA: Diagnosis present

## 2024-04-13 DIAGNOSIS — K5792 Diverticulitis of intestine, part unspecified, without perforation or abscess without bleeding: Secondary | ICD-10-CM | POA: Diagnosis present

## 2024-04-13 DIAGNOSIS — K5732 Diverticulitis of large intestine without perforation or abscess without bleeding: Principal | ICD-10-CM | POA: Diagnosis present

## 2024-04-13 DIAGNOSIS — R7401 Elevation of levels of liver transaminase levels: Secondary | ICD-10-CM | POA: Diagnosis present

## 2024-04-13 DIAGNOSIS — T68XXXA Hypothermia, initial encounter: Secondary | ICD-10-CM | POA: Diagnosis not present

## 2024-04-13 DIAGNOSIS — Z8042 Family history of malignant neoplasm of prostate: Secondary | ICD-10-CM | POA: Diagnosis not present

## 2024-04-13 DIAGNOSIS — R680 Hypothermia, not associated with low environmental temperature: Secondary | ICD-10-CM | POA: Diagnosis present

## 2024-04-13 DIAGNOSIS — F03918 Unspecified dementia, unspecified severity, with other behavioral disturbance: Secondary | ICD-10-CM | POA: Diagnosis present

## 2024-04-13 DIAGNOSIS — Z90710 Acquired absence of both cervix and uterus: Secondary | ICD-10-CM | POA: Diagnosis not present

## 2024-04-13 DIAGNOSIS — I10 Essential (primary) hypertension: Secondary | ICD-10-CM | POA: Diagnosis present

## 2024-04-13 DIAGNOSIS — E875 Hyperkalemia: Secondary | ICD-10-CM | POA: Diagnosis present

## 2024-04-13 DIAGNOSIS — R946 Abnormal results of thyroid function studies: Secondary | ICD-10-CM | POA: Diagnosis present

## 2024-04-13 DIAGNOSIS — Z6837 Body mass index (BMI) 37.0-37.9, adult: Secondary | ICD-10-CM | POA: Diagnosis not present

## 2024-04-13 DIAGNOSIS — F32A Depression, unspecified: Secondary | ICD-10-CM | POA: Diagnosis present

## 2024-04-13 DIAGNOSIS — I959 Hypotension, unspecified: Secondary | ICD-10-CM | POA: Diagnosis present

## 2024-04-13 LAB — CBC WITH DIFFERENTIAL/PLATELET
Abs Immature Granulocytes: 0.01 10*3/uL (ref 0.00–0.07)
Basophils Absolute: 0 10*3/uL (ref 0.0–0.1)
Basophils Relative: 0 %
Eosinophils Absolute: 0.1 10*3/uL (ref 0.0–0.5)
Eosinophils Relative: 1 %
HCT: 35.3 % — ABNORMAL LOW (ref 36.0–46.0)
Hemoglobin: 11.4 g/dL — ABNORMAL LOW (ref 12.0–15.0)
Immature Granulocytes: 0 %
Lymphocytes Relative: 14 %
Lymphs Abs: 0.8 10*3/uL (ref 0.7–4.0)
MCH: 28.1 pg (ref 26.0–34.0)
MCHC: 32.3 g/dL (ref 30.0–36.0)
MCV: 86.9 fL (ref 80.0–100.0)
Monocytes Absolute: 0.6 10*3/uL (ref 0.1–1.0)
Monocytes Relative: 11 %
Neutro Abs: 3.8 10*3/uL (ref 1.7–7.7)
Neutrophils Relative %: 74 %
Platelets: 230 10*3/uL (ref 150–400)
RBC: 4.06 MIL/uL (ref 3.87–5.11)
RDW: 13.9 % (ref 11.5–15.5)
WBC: 5.2 10*3/uL (ref 4.0–10.5)
nRBC: 0 % (ref 0.0–0.2)

## 2024-04-13 LAB — URINALYSIS, W/ REFLEX TO CULTURE (INFECTION SUSPECTED)
Bacteria, UA: NONE SEEN
Bilirubin Urine: NEGATIVE
Glucose, UA: NEGATIVE mg/dL
Hgb urine dipstick: NEGATIVE
Ketones, ur: NEGATIVE mg/dL
Leukocytes,Ua: NEGATIVE
Nitrite: NEGATIVE
Protein, ur: 30 mg/dL — AB
Specific Gravity, Urine: 1.009 (ref 1.005–1.030)
pH: 5 (ref 5.0–8.0)

## 2024-04-13 LAB — COMPREHENSIVE METABOLIC PANEL WITH GFR
ALT: 82 U/L — ABNORMAL HIGH (ref 0–44)
AST: 52 U/L — ABNORMAL HIGH (ref 15–41)
Albumin: 3.6 g/dL (ref 3.5–5.0)
Alkaline Phosphatase: 190 U/L — ABNORMAL HIGH (ref 38–126)
Anion gap: 11 (ref 5–15)
BUN: 35 mg/dL — ABNORMAL HIGH (ref 8–23)
CO2: 23 mmol/L (ref 22–32)
Calcium: 9.8 mg/dL (ref 8.9–10.3)
Chloride: 102 mmol/L (ref 98–111)
Creatinine, Ser: 1.51 mg/dL — ABNORMAL HIGH (ref 0.44–1.00)
GFR, Estimated: 35 mL/min — ABNORMAL LOW (ref >60)
Glucose, Bld: 78 mg/dL (ref 70–99)
Potassium: 5.5 mmol/L — ABNORMAL HIGH (ref 3.5–5.1)
Sodium: 136 mmol/L (ref 135–145)
Total Bilirubin: 0.5 mg/dL (ref 0.0–1.2)
Total Protein: 7.4 g/dL (ref 6.5–8.1)

## 2024-04-13 LAB — LACTIC ACID, PLASMA
Lactic Acid, Venous: 1.1 mmol/L (ref 0.5–1.9)
Lactic Acid, Venous: 0.9 mmol/L (ref 0.5–1.9)

## 2024-04-13 LAB — LIPASE, BLOOD: Lipase: 61 U/L — ABNORMAL HIGH (ref 11–51)

## 2024-04-13 LAB — CBG MONITORING, ED: Glucose-Capillary: 90 mg/dL (ref 70–99)

## 2024-04-13 LAB — MRSA NEXT GEN BY PCR, NASAL: MRSA by PCR Next Gen: NOT DETECTED

## 2024-04-13 LAB — PROTIME-INR
INR: 1 (ref 0.8–1.2)
Prothrombin Time: 13.8 s (ref 11.4–15.2)

## 2024-04-13 LAB — TSH: TSH: 6.944 u[IU]/mL — ABNORMAL HIGH (ref 0.350–4.500)

## 2024-04-13 MED ORDER — DONEPEZIL HCL 5 MG PO TABS
5.0000 mg | ORAL_TABLET | Freq: Every day | ORAL | Status: DC
  Administered 2024-04-13 – 2024-04-15 (×3): 5 mg via ORAL
  Filled 2024-04-13 (×3): qty 1

## 2024-04-13 MED ORDER — ONDANSETRON HCL 4 MG/2ML IJ SOLN: 4.0000 mg | Freq: Four times a day (QID) | INTRAMUSCULAR | Status: DC | PRN

## 2024-04-13 MED ORDER — LACTATED RINGERS IV BOLUS
30.0000 mL/kg | Freq: Once | INTRAVENOUS | Status: DC
Start: 2024-04-13 — End: 2024-04-13

## 2024-04-13 MED ORDER — SODIUM CHLORIDE 0.9 % IV SOLN
2.0000 g | Freq: Once | INTRAVENOUS | Status: AC
  Administered 2024-04-13: 2 g via INTRAVENOUS
  Filled 2024-04-13: qty 12.5

## 2024-04-13 MED ORDER — LACTATED RINGERS IV BOLUS
500.0000 mL | Freq: Once | INTRAVENOUS | Status: AC
  Administered 2024-04-13: 500 mL via INTRAVENOUS

## 2024-04-13 MED ORDER — VANCOMYCIN HCL 1750 MG/350ML IV SOLN
1750.0000 mg | Freq: Once | INTRAVENOUS | Status: AC
  Administered 2024-04-13: 1750 mg via INTRAVENOUS
  Filled 2024-04-13: qty 350

## 2024-04-13 MED ORDER — ONDANSETRON HCL 4 MG PO TABS: 4.0000 mg | ORAL_TABLET | Freq: Four times a day (QID) | ORAL | Status: DC | PRN

## 2024-04-13 MED ORDER — ACETAMINOPHEN 650 MG RE SUPP: 650.0000 mg | Freq: Four times a day (QID) | RECTAL | Status: DC | PRN

## 2024-04-13 MED ORDER — ACETAMINOPHEN 325 MG PO TABS: 650.0000 mg | ORAL_TABLET | Freq: Four times a day (QID) | ORAL | Status: DC | PRN

## 2024-04-13 MED ORDER — VANCOMYCIN HCL IN DEXTROSE 1-5 GM/200ML-% IV SOLN: 1000.0000 mg | Freq: Once | INTRAVENOUS | Status: DC

## 2024-04-13 MED ORDER — CHLORHEXIDINE GLUCONATE CLOTH 2 % EX PADS
6.0000 | MEDICATED_PAD | Freq: Every day | CUTANEOUS | Status: DC
  Administered 2024-04-14 – 2024-04-15 (×2): 6 via TOPICAL

## 2024-04-13 MED ORDER — SODIUM CHLORIDE 0.9 % IV BOLUS
500.0000 mL | Freq: Once | INTRAVENOUS | Status: AC
  Administered 2024-04-13: 500 mL via INTRAVENOUS

## 2024-04-13 MED ORDER — LACTATED RINGERS IV SOLN: INTRAVENOUS | Status: DC

## 2024-04-13 MED ORDER — DONEPEZIL HCL 5 MG PO TABS
10.0000 mg | ORAL_TABLET | Freq: Every day | ORAL | Status: DC
  Administered 2024-04-13 – 2024-04-15 (×3): 10 mg via ORAL
  Filled 2024-04-13 (×3): qty 2

## 2024-04-13 MED ORDER — VANCOMYCIN HCL 1500 MG/300ML IV SOLN: 1500.0000 mg | INTRAVENOUS | Status: DC

## 2024-04-13 MED ORDER — HEPARIN SODIUM (PORCINE) 5000 UNIT/ML IJ SOLN
5000.0000 [IU] | Freq: Three times a day (TID) | INTRAMUSCULAR | Status: DC
  Administered 2024-04-13 – 2024-04-16 (×8): 5000 [IU] via SUBCUTANEOUS
  Filled 2024-04-13 (×8): qty 1

## 2024-04-13 MED ORDER — METRONIDAZOLE 500 MG/100ML IV SOLN
500.0000 mg | Freq: Once | INTRAVENOUS | Status: AC
  Administered 2024-04-13: 500 mg via INTRAVENOUS
  Filled 2024-04-13: qty 100

## 2024-04-13 MED ORDER — LACTATED RINGERS IV BOLUS
1367.0000 mL | Freq: Once | INTRAVENOUS | Status: AC
  Administered 2024-04-13: 1000 mL via INTRAVENOUS

## 2024-04-13 MED ORDER — SODIUM CHLORIDE 0.9 % IV SOLN
2.0000 g | Freq: Two times a day (BID) | INTRAVENOUS | Status: DC
  Administered 2024-04-14 – 2024-04-15 (×3): 2 g via INTRAVENOUS
  Filled 2024-04-13 (×3): qty 12.5

## 2024-04-13 MED ORDER — SODIUM CHLORIDE 0.9 % IV SOLN: INTRAVENOUS | Status: AC

## 2024-04-13 MED ORDER — POLYETHYLENE GLYCOL 3350 17 G PO PACK: 17.0000 g | PACK | Freq: Every day | ORAL | Status: DC | PRN

## 2024-04-13 MED ORDER — METRONIDAZOLE 500 MG/100ML IV SOLN
500.0000 mg | Freq: Two times a day (BID) | INTRAVENOUS | Status: DC
  Administered 2024-04-14 – 2024-04-15 (×3): 500 mg via INTRAVENOUS
  Filled 2024-04-13 (×3): qty 100

## 2024-04-13 MED ORDER — MEMANTINE HCL 10 MG PO TABS
10.0000 mg | ORAL_TABLET | Freq: Two times a day (BID) | ORAL | Status: DC
  Administered 2024-04-13 – 2024-04-16 (×6): 10 mg via ORAL
  Filled 2024-04-13 (×6): qty 1

## 2024-04-13 NOTE — Sepsis Progress Note (Signed)
 Elink following code sepsis  1734 MD and bedside RN messaged asking if additional fluids will be ordered to meet recommendation of with multiple maps less than 65

## 2024-04-13 NOTE — Consult Note (Signed)
 CODE SEPSIS - PHARMACY COMMUNICATION  **Broad Spectrum Antibiotics should be administered within 1 hour of Sepsis diagnosis**  Time Code Sepsis Called/Page Received: 1623  Antibiotics Ordered: cefepime, vancomycin, and Flagyl  Time of 1st antibiotic administration: 1703  Additional action taken by pharmacy: N/A  [name] ,PharmD Clinical Pharmacist  04/13/2024  4:25 PM spe

## 2024-04-13 NOTE — ED Triage Notes (Signed)
 Per facility pt was sent over for being lethargic and hypothermic. Per facility her temperature was 92

## 2024-04-13 NOTE — Consult Note (Signed)
 Pharmacy Antibiotic Note  Doris Riley is a 83 y.o. female admitted on 04/13/2024 with sepsis.  Pharmacy has been consulted for vancomycin and cefepime dosing.  Patient has possible AKI but last Scr found was from 4 years ago.  Plan: Give vancomycin 1750 mg IV x 1, then start vancomycin 1500 mg IV every 48 hours Estimated AUC 480, Cmin 10 IBW, Scr 1.51, Vd 0.72 Order vancomycin levels at steady state or as clinically indicated Start cefepime 2 grams IV every 12 hours Flagyl 500 mg IV every 12 hours per provider Follow renal function and cultures for adjustments  Height: 5' 6 (167.6 cm) Weight: 78.9 kg (173 lb 15.1 oz) IBW/kg (Calculated) : 59.3  Temp (24hrs), Avg:92.6 F (33.7 C), Min:90.6 F (32.6 C), Max:94 F (34.4 C)  Recent Labs  Lab 04/13/24 1447 04/13/24 1549  WBC 5.2  --   CREATININE 1.51*  --   LATICACIDVEN 1.1 0.9    Estimated Creatinine Clearance: 31.5 mL/min (A) (by C-G formula based on SCr of 1.51 mg/dL (H)).    No Known Allergies  Antimicrobials this admission: vancomycin 6/22 >>  cefepime 6/22 >>  Flagyl 6/22 >>  Dose adjustments this admission: N/A  Microbiology results: 6/22 BCx: pending 6/22 MRSA PCR: ordered  Thank you for allowing pharmacy to be a part of this patient's care.  Doris Riley, PharmD 04/13/2024 7:22 PM

## 2024-04-13 NOTE — H&P (Signed)
 History and Physical    Doris Riley FMW:[id number] DOB: 1941/09/16 DOA: 04/13/2024  PCP: System, Provider Not In   Patient coming from: [facility]  I have personally briefly reviewed patient's old medical records in [facility]  Chief Complaint: Hypothermia, lethargy  HPI: Doris Riley is a 83 y.o. female with medical history significant for dementia, hypertension, depression. Patient was brought to the ED from [facility] with reports of lethargy and hypothermia that was noticed this morning.  In the time of my evaluation patient is awake, she is able to talk, but due to significant dementia history is limited.  Patient's son- [name] is at bedside-she ambulates with a walker, at baseline she does not recognize him, and usually has sundowning about this time of day.  ED Course: Hypothermic - 90.6.  Heart rate 47 improved to 60s.  Blood pressure systolic 94-113.  O2 sats greater than 98% on room air. WBC 5.2.  Lactic acid 1.1 > 0.9.  Mild elevation in liver enzymes, AST 52, ALT 82, ALP 190, normal T. bili at 0.5.  Chest x-ray negative for acute abnormality. UA not suggestive of UTI. TSH-6.94 Per EDP tender abdomen on exam, CT abdomen and pelvis without contrast-diffuse left colonic diverticulosis.  Slight haziness noted adjacent to the distal descending colon and mid sigmoid colon concerning for early active diverticulitis.  No complicating feature. 2.3 L bolus given. Broad-spectrum antibiotics IV vancomycin, cefepime and metronidazole started.  Review of Systems: As per HPI all other systems reviewed and negative.  Past Medical History:  Diagnosis Date   Anemia    mild   Arthritis    Constipation    Dementia (HCC)    Depression    Hyperlipidemia    Hypertension     Past Surgical History:  Procedure Laterality Date   ABDOMINAL HYSTERECTOMY     COLONOSCOPY     >10 years ago   COLONOSCOPY  09/01/2011   Procedure: COLONOSCOPY;  Surgeon: Margo CHRISTELLA Haddock, MD;   Location: [facility];  Service: Endoscopy;  Laterality: N/A;  10:30   HARDWARE REMOVAL Left 04/28/2015   Procedure: HARDWARE REMOVAL LEFT KNEE;  Surgeon: Taft FORBES Minerva, MD;  Location: [facility] ORS;  Service: Orthopedics;  Laterality: Left;   KNEE SURGERY     left, fracture   S/P Hysterectomy     partial   TOTAL KNEE ARTHROPLASTY Left 04/28/2015   Procedure:  LEFT TOTAL KNEE ARTHROPLASTY;  Surgeon: Taft FORBES Minerva, MD;  Location: [facility] ORS;  Service: Orthopedics;  Laterality: Left;  depuy implant     reports that she has never smoked. She has never used smokeless tobacco. She reports that she does not drink alcohol and does not use drugs.  No Known Allergies  Family History  Problem Relation Age of Onset   Colon cancer Mother        46s   Prostate cancer Father        29s   GI problems Brother        colostomy, patient not sure why   Colon cancer Brother    Prostate cancer Brother     Prior to Admission medications   Medication Sig Start Date End Date Taking? Authorizing Provider  amLODipine -benazepril  (LOTREL) 10-40 MG capsule Take 1 capsule by mouth daily. 12/01/19   Corum, Olam CROME, MD  atorvastatin  (LIPITOR) 10 MG tablet Take 1 tablet (10 mg total) by mouth daily. 12/01/19   Eldora Olam CROME, MD  donepezil  (ARICEPT ) 10 MG tablet  Take 1 tablet (10 mg total) by mouth at bedtime. 12/01/19   Eldora Olam CROME, MD  escitalopram  (LEXAPRO ) 10 MG tablet TAKE 1 TABLET BY MOUTH DAILY 02/17/20   Corum, Olam CROME, MD  memantine  (NAMENDA ) 10 MG tablet Take 1 tablet (10 mg total) by mouth 2 (two) times daily. 12/01/19   Corum, Olam CROME, MD  QUEtiapine  (SEROQUEL ) 25 MG tablet Take 1 tablet (25 mg total) by mouth at bedtime. 01/05/20   Onita Duos, MD    Physical Exam: Exam limited by patient's dementia Vitals:   04/13/24 1600 04/13/24 1615 04/13/24 1618 04/13/24 1645  BP: 113/88  99/60 (!) 105/51  Pulse:   60   Resp:    20  Temp:  (!) 90.6 F (32.6 C) (!) 91.2 F (32.9 C) (!) 92.8 F (33.8 C)  TempSrc:       SpO2:   98%   Weight:      Height:        Constitutional: NAD, calm, comfortable Vitals:   04/13/24 1600 04/13/24 1615 04/13/24 1618 04/13/24 1645  BP: 113/88  99/60 (!) 105/51  Pulse:   60   Resp:    20  Temp:  (!) 90.6 F (32.6 C) (!) 91.2 F (32.9 C) (!) 92.8 F (33.8 C)  TempSrc:      SpO2:   98%   Weight:      Height:       Eyes: Pulls equal, lids and conjunctivae normal ENMT: Mucous membranes are moist.  Neck: normal, supple, no masses, no thyromegaly Respiratory: clear to auscultation bilaterally, no wheezing, no crackles. Normal respiratory effort. No accessory muscle use.  Cardiovascular: Regular rate and rhythm, 2/6 aortic murmurs, no rubs / gallops. No extremity edema.  Extremities warm. Abdomen: no tenderness, no masses palpated. No hepatosplenomegaly.  Musculoskeletal: no clubbing / cyanosis. No joint deformity upper and lower extremities.  Good range of motion. Skin: no rashes, lesions, ulcers. No induration Neurologic: Limited exam due to baseline dementia, no facial asymmetry, speech fluent sometimes confused  Psychiatric: Awake, a bit lethargic but following a few directions.  Labs on Admission: I have personally reviewed following labs and imaging studies  CBC: Recent Labs  Lab 04/13/24 1447  WBC 5.2  NEUTROABS 3.8  HGB 11.4*  HCT 35.3*  MCV 86.9  PLT 230   Basic Metabolic Panel: Recent Labs  Lab 04/13/24 1447  NA 136  K 5.5*  CL 102  CO2 23  GLUCOSE 78  BUN 35*  CREATININE 1.51*  CALCIUM  9.8   GFR: Estimated Creatinine Clearance: 31.5 mL/min (A) (by C-G formula based on SCr of 1.51 mg/dL (H)). Liver Function Tests: Recent Labs  Lab 04/13/24 1447  AST 52*  ALT 82*  ALKPHOS 190*  BILITOT 0.5  PROT 7.4  ALBUMIN 3.6   Recent Labs  Lab 04/13/24 1559  LIPASE 61*   Coagulation Profile: Recent Labs  Lab 04/13/24 1549  INR 1.0   CBG: Recent Labs  Lab 04/13/24 1450  GLUCAP 90   Thyroid  Function Tests: Recent Labs     04/13/24 1447  TSH 6.944*   Urine analysis:    Component Value Date/Time   COLORURINE YELLOW 04/13/2024 1639   APPEARANCEUR CLEAR 04/13/2024 1639   LABSPEC 1.009 04/13/2024 1639   PHURINE 5.0 04/13/2024 1639   GLUCOSEU NEGATIVE 04/13/2024 1639   HGBUR NEGATIVE 04/13/2024 1639   BILIRUBINUR NEGATIVE 04/13/2024 1639   KETONESUR NEGATIVE 04/13/2024 1639   PROTEINUR 30 (A) 04/13/2024 1639   UROBILINOGEN  1.0 04/22/2011 1555   NITRITE NEGATIVE 04/13/2024 1639   LEUKOCYTESUR NEGATIVE 04/13/2024 1639    Radiological Exams on Admission: CT ABDOMEN PELVIS WO CONTRAST Result Date: 04/13/2024 CLINICAL DATA:  General abdominal pain EXAM: CT ABDOMEN AND PELVIS WITHOUT CONTRAST TECHNIQUE: Multidetector CT imaging of the abdomen and pelvis was performed following the standard protocol without IV contrast. RADIATION DOSE REDUCTION: This exam was performed according to the departmental dose-optimization program which includes automated exposure control, adjustment of the mA and/or kV according to patient size and/or use of iterative reconstruction technique. COMPARISON:  None available FINDINGS: Lower chest: No acute abnormality Hepatobiliary: No focal hepatic abnormality. Gallbladder unremarkable. Pancreas: No focal abnormality or ductal dilatation. Spleen: No focal abnormality.  Normal size. Adrenals/Urinary Tract: No adrenal abnormality. No focal renal abnormality. No stones or hydronephrosis. Foley catheter in the bladder with bladder decompressed. Stomach/Bowel: Left colonic diverticulosis. Slight haziness noted around the mid descending colon as well as the mid sigmoid colon concerning for early diverticulitis. Stomach and small bowel decompressed. Vascular/Lymphatic: Aortic atherosclerosis. No evidence of aneurysm or adenopathy. Reproductive: Prior hysterectomy.  No adnexal masses. Other: No free fluid or free air. Musculoskeletal: No acute bony abnormality IMPRESSION: Diffuse left colonic  diverticulosis. Slight haziness noted adjacent to the distal descending colon and mid sigmoid colon concerning for early active diverticulitis. No complicating feature. Aortic atherosclerosis. Electronically Signed   By: Franky Crease M.D.   On: 04/13/2024 16:35   DG Chest Port 1 View Result Date: 04/13/2024 CLINICAL DATA:  Questionable sepsis EXAM: PORTABLE CHEST 1 VIEW COMPARISON:  05/22/2011 FINDINGS: Normal cardiac silhouette. Ectatic aorta. Low lung volumes. Chronic bronchitic markings. No focal consolidation. No pneumothorax No acute osseous abnormality. IMPRESSION: Low lung volumes and chronic bronchitic markings. No acute findings. Electronically Signed   By: Jackquline Boxer M.D.   On: 04/13/2024 14:36    EKG: Independently reviewed.  Sinus rhythm/ectopic atrial rhythm.  Rate 89.  Old LBBB.  QTc 451.  Artifacts.  No significant change from prior.  Assessment/Plan Principal Problem:   Hypothermia Active Problems:   Acute diverticulitis   Dementia with behavioral disturbance (HCC)   Essential hypertension  Assessment and Plan: * Hypothermia Hypothermic, temperature down to 90.6, with heart rates initially 47 now improved to 60s.  Systolic blood pressure down to 94 now improved.  X-ray negative for acute abnormality.  UA not suggestive of  UTI.  CT abdomen and pelvis without contrast suggesting acute diverticulitis.  Abdomen benign on my evaluation.  WBC 5.2.  TSH mildly elevated at 6.94 in the setting of acute illness, and not enough content of to explain this degree of hypothermia. -Continue Bair hugger -2.3 L bolus fluids given, cont n/s 75cc/hr x 15hrs -Follow-up blood cultures -Continue broad-spectrum antibiotics IV vancomycin cefepime and metronidazole  Acute diverticulitis Reported initial tenderness by EDP, abdomen benign on my evaluation.  No reported vomiting or diarrhea.  CT abdomen and pelvis without contrast - ?  Early active diverticulitis.   -Plan per above -IV morphine  2 mg every 4 hours as needed  Essential hypertension Blood pressure initially soft. - Hold amlodipine  benazepril   Dementia with behavioral disturbance (HCC) Long-term nursing home resident.  Son reports patient has a tendency to wander.  At baseline does not recognize her son, ambulates with a walker.  Reported lethargy, currently patient awake and able to talk but speech sometimes confused. - Resume memantine  donepezil   Likely AKI creatinine 1.51, last checked 4 years ago creatinine was 1.08. - Hydrate  DVT prophylaxis:  heparin Code Status: FULL code-confirmed with Son at bedside Family Communication: Son- Lamont at bedside Disposition Plan: ~ 2 days Consults called: FULL code Admission status: Inpt Stepdown I certify that at the point of admission it is my clinical judgment that the patient will require inpatient hospital care spanning beyond 2 midnights from the point of admission due to high intensity of service, high risk for further deterioration and high frequency of surveillance required.    Author: Tully FORBES Carwin, MD 04/13/2024 7:16 PM  For on call review [email or web address].

## 2024-04-13 NOTE — Sepsis Progress Note (Signed)
 Elink following up on sepsis protocol, chat took place with BSRN, 2 PIV's just placed with plans for adm on needed fluids

## 2024-04-13 NOTE — ED Provider Notes (Signed)
 [facility] AT [facility] Provider Note   CSN: [id number] Arrival date & time: 04/13/24  1326     Patient presents with: Weakness   Doris Riley is a 83 y.o. female.  He has history of dementia, hypertension and is presenting today from [facility].  She is noted to be active today than normal, they checked her vital signs and she was noted to have a temperature of 92 Fahrenheit.  She presents ER for further evaluation, she is not able to provide much history due to her dementia, family is at bedside and they states she is less active than her usual.  Patient is oriented to self, denies any specific complaints.    Weakness      Prior to Admission medications   Medication Sig Start Date End Date Taking? Authorizing Provider  amLODipine -benazepril  (LOTREL) 10-40 MG capsule Take 1 capsule by mouth daily. 12/01/19   Corum, Olam CROME, MD  atorvastatin  (LIPITOR) 10 MG tablet Take 1 tablet (10 mg total) by mouth daily. 12/01/19   Corum, Olam CROME, MD  donepezil  (ARICEPT ) 10 MG tablet Take 1 tablet (10 mg total) by mouth at bedtime. 12/01/19   Eldora Olam CROME, MD  escitalopram  (LEXAPRO ) 10 MG tablet TAKE 1 TABLET BY MOUTH DAILY 02/17/20   Corum, Olam CROME, MD  memantine  (NAMENDA ) 10 MG tablet Take 1 tablet (10 mg total) by mouth 2 (two) times daily. 12/01/19   Corum, Olam CROME, MD  QUEtiapine  (SEROQUEL ) 25 MG tablet Take 1 tablet (25 mg total) by mouth at bedtime. 01/05/20   Onita Duos, MD    Allergies: Patient has no known allergies.    Review of Systems  Neurological:  Positive for weakness.    Updated Vital Signs BP 107/68   Pulse (!) 54   Temp (!) 90.6 F (32.6 C) (Rectal)   Resp 11   Ht 5' 6 (1.676 m)   Wt 78.9 kg   SpO2 100%   BMI 28.08 kg/m   Physical Exam Vitals and nursing note reviewed.  Constitutional:      General: She is not in acute distress.    Appearance: She is well-developed.  HENT:     Head: Normocephalic and atraumatic.   Eyes:      Conjunctiva/sclera: Conjunctivae normal.    Cardiovascular:     Rate and Rhythm: Normal rate and regular rhythm.     Heart sounds: No murmur heard. Pulmonary:     Effort: Pulmonary effort is normal. No respiratory distress.     Breath sounds: Normal breath sounds.  Abdominal:     Palpations: Abdomen is soft.     Tenderness: There is abdominal tenderness in the right lower quadrant, suprapubic area and left lower quadrant. There is no right CVA tenderness, left CVA tenderness, guarding or rebound.   Musculoskeletal:        General: No swelling.     Cervical back: Neck supple.   Skin:    General: Skin is warm and dry.     Capillary Refill: Capillary refill takes less than 2 seconds.   Neurological:     Mental Status: She is alert.   Psychiatric:        Mood and Affect: Mood normal.     (all labs ordered are listed, but only abnormal results are displayed) Labs Reviewed  COMPREHENSIVE METABOLIC PANEL WITH GFR - Abnormal; Notable for the following components:      Result Value   Potassium 5.5 (*)  BUN 35 (*)    Creatinine, Ser 1.51 (*)    AST 52 (*)    ALT 82 (*)    Alkaline Phosphatase 190 (*)    GFR, Estimated 35 (*)    All other components within normal limits  CBC WITH DIFFERENTIAL/PLATELET - Abnormal; Notable for the following components:   Hemoglobin 11.4 (*)    HCT 35.3 (*)    All other components within normal limits  TSH - Abnormal; Notable for the following components:   TSH 6.944 (*)    All other components within normal limits  CULTURE, BLOOD (ROUTINE X 2)  CULTURE, BLOOD (ROUTINE X 2)  LACTIC ACID, PLASMA  LACTIC ACID, PLASMA  URINALYSIS, W/ REFLEX TO CULTURE (INFECTION SUSPECTED)  PROTIME-INR  LIPASE, BLOOD  CBG MONITORING, ED    EKG: EKG Interpretation Date/Time:  Sunday April 13 2024 13:48:37 EDT Ventricular Rate:  89 PR Interval:  309 QRS Duration:  156 QT Interval:  471 QTC Calculation: 451 R Axis:   -2  Text Interpretation: Sinus or  ectopic atrial rhythm Multiform ventricular premature complexes Prolonged PR interval Left bundle branch block No significant change since last tracing Confirmed by Dean Clarity [phone]) on 04/13/2024 3:36:30 PM  Radiology: [facility] Chest Port 1 View Result Date: 04/13/2024 CLINICAL DATA:  Questionable sepsis EXAM: PORTABLE CHEST 1 VIEW COMPARISON:  05/22/2011 FINDINGS: Normal cardiac silhouette. Ectatic aorta. Low lung volumes. Chronic bronchitic markings. No focal consolidation. No pneumothorax No acute osseous abnormality. IMPRESSION: Low lung volumes and chronic bronchitic markings. No acute findings. Electronically Signed   By: Jackquline Boxer M.D.   On: 04/13/2024 14:36     Procedures   Medications Ordered in the ED  sodium chloride  0.9 % bolus 500 mL (has no administration in time range)  lactated ringers  bolus 500 mL (0 mLs Intravenous Stopped 04/13/24 1525)                                    Medical Decision Making Differential diagnosis includes not limited to sepsis, pneumonia, UTI, intra-abdominal infection, myxedema, electrolyte derangement, other  ED course: Patient presents with hypothermia from nursing home, initially  hypotensive but improved with IV fluids.  She was hypothermic with initial rectal temperature of 90 and was put on [employer]  She is awake and alert.  Noted to have some abdominal tenderness but otherwise reassuring exam.  Labs were ordered, significant for potassium 5.5, mild AKI with BUN 35 creatinine 1.51, and mild transaminitis.  She did not have a leukocytosis or neutropenia, no significant anemia.  Chest x-ray ordered and shows no pulm edema or infiltrate per my interpretation I agree with radiology read  As patient has some mild abdominal tenderness in addition to elevated LFTs we ordered a CT scan of the pelvis looking for additional source of infection for her because of hypothermia and hypotension.  Broad-spectrum antibiotics were ordered she did have  possible early diverticulitis which correlates with her left lower quadrant tenderness on exam.   Consults: Discussed with Dr. CHARLENA Carwin for admission.     Amount and/or Complexity of Data Reviewed External Data Reviewed: labs and notes.    Details: Nursing home record Labs: ordered. Decision-making details documented in ED Course. Radiology: ordered. Decision-making details documented in ED Course. ECG/medicine tests: ordered.  Risk Prescription drug management. Decision regarding hospitalization.        Final diagnoses:  None    ED  Discharge Orders     None          Suellen Sherran DELENA DEVONNA 04/13/24 DANIAL Dean Clarity, MD 04/13/24 [phone]

## 2024-04-13 NOTE — ED Notes (Signed)
 Bair hugger placed on pt, family at bedside

## 2024-04-13 NOTE — Assessment & Plan Note (Signed)
 Blood pressure initially soft. - Hold amlodipine  benazepril 

## 2024-04-13 NOTE — Assessment & Plan Note (Addendum)
 Hypothermic, temperature down to 90.6, with heart rates initially 47 now improved to 60s.  Systolic blood pressure down to 94 now improved.  X-ray negative for acute abnormality.  UA not suggestive of  UTI.  CT abdomen and pelvis without contrast suggesting acute diverticulitis.  Abdomen benign on my evaluation.  WBC 5.2.  TSH mildly elevated at 6.94 in the setting of acute illness, and not enough content of to explain this degree of hypothermia. -Continue Bair hugger -2.3 L bolus fluids given, cont n/s 75cc/hr x 15hrs -Follow-up blood cultures -Continue broad-spectrum antibiotics IV vancomycin cefepime and metronidazole

## 2024-04-13 NOTE — Assessment & Plan Note (Signed)
 Reported initial tenderness by EDP, abdomen benign on my evaluation.  No reported vomiting or diarrhea.  CT abdomen and pelvis without contrast - ?  Early active diverticulitis.   -Plan per above -IV morphine 2 mg every 4 hours as needed

## 2024-04-13 NOTE — Assessment & Plan Note (Addendum)
 Long-term nursing home resident.  Son reports patient has a tendency to wander.  At baseline does not recognize her son, ambulates with a walker.  Reported lethargy, currently patient awake and able to talk but speech sometimes confused. - Resume memantine  donepezil 

## 2024-04-14 DIAGNOSIS — T68XXXA Hypothermia, initial encounter: Secondary | ICD-10-CM | POA: Diagnosis not present

## 2024-04-14 LAB — BASIC METABOLIC PANEL WITH GFR
Anion gap: 6 (ref 5–15)
Anion gap: 8 (ref 5–15)
BUN: 27 mg/dL — ABNORMAL HIGH (ref 8–23)
BUN: 26 mg/dL — ABNORMAL HIGH (ref 8–23)
CO2: 24 mmol/L (ref 22–32)
CO2: 24 mmol/L (ref 22–32)
Calcium: 9.2 mg/dL (ref 8.9–10.3)
Calcium: 9.3 mg/dL (ref 8.9–10.3)
Chloride: 108 mmol/L (ref 98–111)
Chloride: 105 mmol/L (ref 98–111)
Creatinine, Ser: 1.4 mg/dL — ABNORMAL HIGH (ref 0.44–1.00)
Creatinine, Ser: 1.46 mg/dL — ABNORMAL HIGH (ref 0.44–1.00)
GFR, Estimated: 38 mL/min — ABNORMAL LOW (ref >60)
GFR, Estimated: 36 mL/min — ABNORMAL LOW (ref >60)
Glucose, Bld: 83 mg/dL (ref 70–99)
Glucose, Bld: 86 mg/dL (ref 70–99)
Potassium: 6.5 mmol/L (ref 3.5–5.1)
Potassium: 5.9 mmol/L — ABNORMAL HIGH (ref 3.5–5.1)
Sodium: 138 mmol/L (ref 135–145)
Sodium: 137 mmol/L (ref 135–145)

## 2024-04-14 LAB — CBC
HCT: 33.1 % — ABNORMAL LOW (ref 36.0–46.0)
Hemoglobin: 10.5 g/dL — ABNORMAL LOW (ref 12.0–15.0)
MCH: 28 pg (ref 26.0–34.0)
MCHC: 31.7 g/dL (ref 30.0–36.0)
MCV: 88.3 fL (ref 80.0–100.0)
Platelets: 218 10*3/uL (ref 150–400)
RBC: 3.75 MIL/uL — ABNORMAL LOW (ref 3.87–5.11)
RDW: 14.3 % (ref 11.5–15.5)
WBC: 4.6 10*3/uL (ref 4.0–10.5)
nRBC: 0.4 % — ABNORMAL HIGH (ref 0.0–0.2)

## 2024-04-14 LAB — GLUCOSE, CAPILLARY: Glucose-Capillary: 84 mg/dL (ref 70–99)

## 2024-04-14 LAB — POTASSIUM
Potassium: 5.3 mmol/L — ABNORMAL HIGH (ref 3.5–5.1)
Potassium: 5.7 mmol/L — ABNORMAL HIGH (ref 3.5–5.1)
Potassium: 5.5 mmol/L — ABNORMAL HIGH (ref 3.5–5.1)

## 2024-04-14 MED ORDER — SODIUM ZIRCONIUM CYCLOSILICATE 10 G PO PACK
10.0000 g | PACK | Freq: Three times a day (TID) | ORAL | Status: DC
  Administered 2024-04-14 (×4): 10 g via ORAL
  Filled 2024-04-14 (×4): qty 1

## 2024-04-14 MED ORDER — FUROSEMIDE 10 MG/ML IJ SOLN
20.0000 mg | Freq: Once | INTRAMUSCULAR | Status: AC
  Administered 2024-04-14: 20 mg via INTRAVENOUS
  Filled 2024-04-14: qty 2

## 2024-04-14 MED ORDER — CALCIUM GLUCONATE-NACL 1-0.675 GM/50ML-% IV SOLN
1.0000 g | Freq: Once | INTRAVENOUS | Status: AC
  Administered 2024-04-14: 1000 mg via INTRAVENOUS
  Filled 2024-04-14: qty 50

## 2024-04-14 MED ORDER — DEXTROSE 50 % IV SOLN
1.0000 | Freq: Once | INTRAVENOUS | Status: AC
  Administered 2024-04-14: 50 mL via INTRAVENOUS
  Filled 2024-04-14: qty 50

## 2024-04-14 MED ORDER — SODIUM CHLORIDE 0.9 % IV SOLN: INTRAVENOUS | Status: DC

## 2024-04-14 MED ORDER — INSULIN ASPART 100 UNIT/ML IV SOLN
5.0000 [IU] | Freq: Once | INTRAVENOUS | Status: AC
  Administered 2024-04-14: 5 [IU] via INTRAVENOUS

## 2024-04-14 NOTE — Plan of Care (Signed)

## 2024-04-14 NOTE — TOC Initial Note (Signed)
 Transition of Care [facility]) - Initial/Assessment Note    Patient Details  Name: Doris Riley MRN: [id number] Date of Birth: [birth year]  Transition of Care [facility]) CM/SW Contact:    Noreen KATHEE Cleotilde ISRAEL Phone Number: 04/14/2024, 12:59 PM  Clinical Narrative:                   CSW assessed patient with cousin Faith. Faith shared that patient is a long-term resident at [facility] and plans are for her to return back. Faith shared that patient has been at [facility] for 3-4 years and the staff assist with dressing and bathing. TOC to follow.   Expected Discharge Plan: Skilled Nursing Facility Barriers to Discharge: Continued Medical Work up   Patient Goals and CMS Choice Patient states their goals for this hospitalization and ongoing recovery are:: return back to [facility].gov Compare Post Acute Care list provided to:: Patient Represenative (must comment) (Faith - cousin)        Expected Discharge Plan and Services     Post Acute Care Choice: Skilled Nursing Facility, Durable Medical Equipment Living arrangements for the past 2 months: Skilled Nursing Facility                                      Prior Living Arrangements/Services Living arrangements for the past 2 months: Skilled Nursing Facility Lives with:: Facility Resident Patient language and need for interpreter reviewed:: Yes Do you feel safe going back to the place where you live?: Yes      Need for Family Participation in Patient Care: Yes (Comment) Care giver support system in place?: Yes (comment) Current home services: DME Criminal Activity/Legal Involvement Pertinent to Current Situation/Hospitalization: No - Comment as needed  Activities of Daily Living   ADL Screening (condition at time of admission) Independently performs ADLs?: No Does the patient have a NEW difficulty with bathing/dressing/toileting/self-feeding that is expected to last >3 days?: No Does the patient have a NEW difficulty with getting  in/out of bed, walking, or climbing stairs that is expected to last >3 days?: No Does the patient have a NEW difficulty with communication that is expected to last >3 days?: No Is the patient deaf or have difficulty hearing?: No Does the patient have difficulty seeing, even when wearing glasses/contacts?: No Does the patient have difficulty concentrating, remembering, or making decisions?: Yes  Permission Sought/Granted      Share Information with NAME: Faith     Permission granted to share info w Relationship: Cousin     Emotional Assessment Appearance:: Appears stated age     Orientation: : Oriented to Self Alcohol / Substance Use: Not Applicable Psych Involvement: No (comment)  Admission diagnosis:  Diverticulitis [K57.92] Hypothermia [T68.XXXA] Hypothermia, initial encounter [T68.XXXA] Patient Active Problem List   Diagnosis Date Noted   Hypothermia 04/13/2024   Acute diverticulitis 04/13/2024   Vitamin B 12 deficiency 01/06/2020   Elevated glucose 12/11/2019   Need for vaccination for Strep pneumoniae 12/11/2019   Screening examination for infectious disease 12/11/2019   Dementia with behavioral disturbance (HCC) 12/01/2019   Chronic pain of right knee 12/01/2019   Essential hypertension 12/01/2019   Hyperlipidemia 12/01/2019   Arthritis of knee, degenerative 04/28/2015   S/P total knee replacement, left 04/28/15    Tibial plateau fracture    Bloating 10/26/2011   Constipation 08/07/2011   Family history of colon cancer 08/07/2011   Alkaline phosphatase raised  08/07/2011   Anemia 08/07/2011   PCP:  System, Provider Not In Pharmacy:   [facility] [phone] - [place], [place] - [address] AT SEC OF [address] & [address] [place] [address] Phone: [phone] Fax: [phone]  [facility] - [place], [place] - [address] [place] [address] Phone: [phone] Fax:  [phone]     Social Drivers of Health (SDOH) Social History: SDOH Screenings   Food Insecurity: No Food Insecurity (04/13/2024)  Housing: Low Risk  (04/13/2024)  Transportation Needs: No Transportation Needs (04/13/2024)  Utilities: At Risk (04/13/2024)  Depression (PHQ2-9): Medium Risk (12/09/2019)  Social Connections: Patient Unable To Answer (04/13/2024)  Tobacco Use: Low Risk  (04/13/2024)   SDOH Interventions:     Readmission Risk Interventions     No data to display

## 2024-04-14 NOTE — Progress Notes (Signed)
 [facility] PROGRESS NOTE  Doris Riley (DOB: [birth year]) FMW:[id number] PCP: System, Provider Not In  Brief Narrative: Doris Riley is an 83 y.o. female with medical history significant for dementia, hypertension, depression brought from long term care at [facility] with reports of lethargy and hypothermia that was noticed this morning.  In the time of my evaluation patient is awake, she is able to talk, but due to significant dementia history is limited.  Patient's son- Doris Riley is at bedside-she ambulates with a walker, at baseline she does not recognize him, and usually has sundowning about this time of day.   ED Course: Hypothermic - 90.6.  Heart rate 47 improved to 60s.  Blood pressure systolic 94-113.  O2 sats greater than 98% on room air. WBC 5.2.  Lactic acid 1.1 > 0.9.  Mild elevation in liver enzymes, AST 52, ALT 82, ALP 190, normal T. bili at 0.5.  Chest x-ray negative for acute abnormality. UA not suggestive of UTI. TSH-6.94 Per EDP tender abdomen on exam, CT abdomen and pelvis without contrast-diffuse left colonic diverticulosis.  Slight haziness noted adjacent to the distal descending colon and mid sigmoid colon concerning for early active diverticulitis.  No complicating feature. 2.3 L bolus given. Broad-spectrum antibiotics IV vancomycin, cefepime and metronidazole started.  Temperature has improved, BP still soft.   Subjective: Confused but interactive, no agitation. Has no complaints volunteered or on ROS including chest pain, dyspnea, cough, other pain.   Objective: BP (!) 105/51   Pulse 80   Temp 98.5 F (36.9 C) (Rectal)   Resp 20   Ht 5' 6 (1.676 m)   Wt 78.9 kg   SpO2 100%   BMI 28.08 kg/m   Gen: Elderly frail female in no acute distress Pulm: Clear, nonlabored  CV: II/VI systolic murmur at the base, no RG, NSR, trace nontender edema GI: Soft, NT, ND, +BS Neuro: Alert and disoriented. No new focal deficits. Ext: Warm, no deformities    Assessment & Plan: Hypothermia: Hypothermic, temperature down to 90.6, with heart rates initially 47 now improved to 60s.  Systolic blood pressure down to 94 now improved.  X-ray negative for acute abnormality.  UA not suggestive of  UTI.  CT abdomen and pelvis without contrast suggesting acute diverticulitis.  Abdomen benign on my evaluation.  WBC 5.2.  TSH mildly elevated at 6.94 in the setting of acute illness, and not enough content of to explain this degree of hypothermia. -Continue Bair hugger prn, has been liberated today. Blood cultures NGTD, WBC normal. Will DC vancomycin for fear of AKI.   Acute diverticulitis: Reported initial tenderness by EDP, abdomen benign on my evaluation.  No reported vomiting or diarrhea.  CT abdomen and pelvis without contrast - ?  Early active diverticulitis.   - Continue antibiotics as discussed above. ADAT, prn's as ordered.    Essential hypertension - Continue to hold amlodipine  benazepril    Dementia with behavioral disturbance (HCC) Long-term nursing home resident.  Son reports patient has a tendency to wander.  At baseline does not recognize her son, ambulates with a walker.  Reported lethargy, currently patient awake and able to talk but speech sometimes confused. - Resume memantine  donepezil    Suspected AKI: vs. stage IIIb CKD. Will modify Dc based on trends.  - Avoid nephrotoxins, monitor I/O, scan to r/o retention with ongoing hyperkalemia. Low K diet ordered.   Hyperkalemia:  - Continue lokelma at high dose, give lasix IV this PM as K trending back upwards. Recheck this  PM.  - Continue cardiac monitoring, note full code.  - Already received other temporizing measures, give calcium  gluconate.    Bernardino KATHEE Come, MD [facility] [email or web address] 04/14/2024, 7:21 PM

## 2024-04-15 DIAGNOSIS — T68XXXA Hypothermia, initial encounter: Secondary | ICD-10-CM | POA: Diagnosis not present

## 2024-04-15 LAB — BASIC METABOLIC PANEL WITH GFR
Anion gap: 8 (ref 5–15)
BUN: 24 mg/dL — ABNORMAL HIGH (ref 8–23)
CO2: 23 mmol/L (ref 22–32)
Calcium: 9.5 mg/dL (ref 8.9–10.3)
Chloride: 106 mmol/L (ref 98–111)
Creatinine, Ser: 1.58 mg/dL — ABNORMAL HIGH (ref 0.44–1.00)
GFR, Estimated: 33 mL/min — ABNORMAL LOW (ref >60)
Glucose, Bld: 97 mg/dL (ref 70–99)
Potassium: 4.6 mmol/L (ref 3.5–5.1)
Sodium: 137 mmol/L (ref 135–145)

## 2024-04-15 LAB — CBC
HCT: 35.8 % — ABNORMAL LOW (ref 36.0–46.0)
Hemoglobin: 10.8 g/dL — ABNORMAL LOW (ref 12.0–15.0)
MCH: 26.8 pg (ref 26.0–34.0)
MCHC: 30.2 g/dL (ref 30.0–36.0)
MCV: 88.8 fL (ref 80.0–100.0)
Platelets: 217 10*3/uL (ref 150–400)
RBC: 4.03 MIL/uL (ref 3.87–5.11)
RDW: 14.1 % (ref 11.5–15.5)
WBC: 5.5 10*3/uL (ref 4.0–10.5)
nRBC: 0 % (ref 0.0–0.2)

## 2024-04-15 MED ORDER — SODIUM ZIRCONIUM CYCLOSILICATE 10 G PO PACK
10.0000 g | PACK | Freq: Three times a day (TID) | ORAL | Status: AC
  Administered 2024-04-15 (×2): 10 g via ORAL
  Filled 2024-04-15 (×2): qty 1

## 2024-04-15 MED ORDER — ORAL CARE MOUTH RINSE: 15.0000 mL | OROMUCOSAL | Status: DC | PRN

## 2024-04-15 MED ORDER — AMOXICILLIN-POT CLAVULANATE 875-125 MG PO TABS
1.0000 | ORAL_TABLET | Freq: Two times a day (BID) | ORAL | Status: DC
  Administered 2024-04-15 – 2024-04-16 (×2): 1 via ORAL
  Filled 2024-04-15 (×2): qty 1

## 2024-04-15 NOTE — Progress Notes (Signed)
 [facility] PROGRESS NOTE  Doris Riley (DOB: Oct 05, 1941) FMW:[id number] PCP: System, Provider Not In  Brief Narrative: Doris Riley is an 83 y.o. female with medical history significant for dementia, hypertension, depression brought from long term care at [facility] with reports of lethargy and hypothermia that was noticed this morning.  In the time of my evaluation patient is awake, she is able to talk, but due to significant dementia history is limited.  Patient's son- [name] is at bedside-she ambulates with a walker, at baseline she does not recognize him, and usually has sundowning about this time of day.   ED Course: Hypothermic - 90.6.  Heart rate 47 improved to 60s.  Blood pressure systolic 94-113.  O2 sats greater than 98% on room air. WBC 5.2.  Lactic acid 1.1 > 0.9.  Mild elevation in liver enzymes, AST 52, ALT 82, ALP 190, normal T. bili at 0.5.  Chest x-ray negative for acute abnormality. UA not suggestive of UTI. TSH-6.94 Per EDP tender abdomen on exam, CT abdomen and pelvis without contrast-diffuse left colonic diverticulosis.  Slight haziness noted adjacent to the distal descending colon and mid sigmoid colon concerning for early active diverticulitis.  No complicating feature. 2.3 L bolus given. Broad-spectrum antibiotics IV vancomycin, cefepime and metronidazole started.  Vital signs have stabilized, transfer to floor 6/24.   Subjective: Confused but cooperative without complaints. Ate some of breakfast. No overnight events reported.   Objective: BP 130/79   Pulse 80   Temp 99 F (37.2 C) (Bladder)   Resp 17   Ht 5' 6 (1.676 m)   Wt 78.9 kg   SpO2 98%   BMI 28.08 kg/m   Gen: Elderly female in no distress Pulm: Clear, nonlabored  CV: RRR, +II/VI systolic murmur at USB, no rub or gallop, +trace edema.  GI: Soft, NT, ND, +BS Neuro: Alert and disoriented. No new focal deficits.   Assessment & Plan: Hypothermia: Hypothermic, temperature down to 90.6,  with heart rates initially 47 now improved to 60s.  Systolic blood pressure down to 94 now improved.  X-ray negative for acute abnormality.  UA not suggestive of  UTI.  CT abdomen and pelvis without contrast suggesting acute diverticulitis.  Abdomen benign on my evaluation.  WBC 5.2.  TSH mildly elevated at 6.94 in the setting of acute illness, and not enough content of to explain this degree of hypothermia. - Blood cultures NGTD, WBC normal.  - Will check AM cortisol with this, hyperkalemia, borderline glucose levels.  - DC foley, transfer patient. K normalized and will continue holding benazepril . Telemetry showed no T wave changes even while hyperkalemic, so will not plan to continue this (to hopefully minimize irritation that could worsen delirium).    Acute diverticulitis: Reported initial tenderness by EDP, abdomen benign on my evaluation.  No reported vomiting or diarrhea.  CT abdomen and pelvis without contrast - ?  Early active diverticulitis.   - Continue antibiotics, will transition to augmentin (NKDA) since blood cultures are NGTD and pt's exam is benign, taking adequate po.  - ADAT (continue renal diet for hyperK), prn's as ordered.    Essential hypertension - Continue to hold benazepril . If BP trends continue, will restart amlodipine .    Dementia with behavioral disturbance (HCC) Long-term nursing home resident.  Son reports patient has a tendency to wander.  At baseline does not recognize her son, ambulates with a walker.  Reported lethargy, currently patient awake and able to talk but speech sometimes confused. - Resume  memantine  and donepezil  - Plan return to LTC facility at discharge, anticipating 6/25, TOC aware.    Suspected stage IIIb CKD: SCr elevated but relatively stable.  - Avoid nephrotoxins, monitor I/O, scan to r/o retention with ongoing hyperkalemia. Low K diet ordered.   Hyperkalemia:  - Resolved, continue holding ACEi. - Low potassium diet  Elevated TSH: -  Check free T3, free T4.    Bernardino KATHEE Come, MD [facility] [email or web address] 04/15/2024, 8:40 AM

## 2024-04-15 NOTE — NC FL2 (Signed)
 [place]  MEDICAID [id number] LEVEL OF CARE FORM     IDENTIFICATION  Patient Name: Doris Riley Birthdate: [birth year] Sex: female Admission Date (Current Location): 04/13/2024  [place] Number:  Doris Riley [id number] Q Facility and Address:  [facility],  [address], [place] [address]      Provider Number: [id number]  Attending Physician Name and Address:  Bryn Bernardino NOVAK, MD  Relative Name and Phone Number:       Current Level of Care:   Recommended Level of Care: Nursing Facility, Memory Care Prior Approval Number:    Date Approved/Denied:   PASRR Number: [id number]  Discharge Plan: SNF    Current Diagnoses: Patient Active Problem List   Diagnosis Date Noted   Hypothermia 04/13/2024   Acute diverticulitis 04/13/2024   Vitamin B 12 deficiency 01/06/2020   Elevated glucose 12/11/2019   Need for vaccination for Strep pneumoniae 12/11/2019   Screening examination for infectious disease 12/11/2019   Dementia with behavioral disturbance (HCC) 12/01/2019   Chronic pain of right knee 12/01/2019   Essential hypertension 12/01/2019   Hyperlipidemia 12/01/2019   Arthritis of knee, degenerative 04/28/2015   S/P total knee replacement, left 04/28/15    Tibial plateau fracture    Bloating 10/26/2011   Constipation 08/07/2011   Family history of colon cancer 08/07/2011   Alkaline phosphatase raised 08/07/2011   Anemia 08/07/2011    Orientation RESPIRATION BLADDER Height & Weight     Self  Normal Indwelling catheter Weight: 173 lb 15.1 oz (78.9 kg) Height:  5' 6 (167.6 cm)  BEHAVIORAL SYMPTOMS/MOOD NEUROLOGICAL BOWEL NUTRITION STATUS      Incontinent Diet (See d/c summary)  AMBULATORY STATUS COMMUNICATION OF NEEDS Skin   Extensive Assist Verbally Normal                       Personal Care Assistance Level of Assistance  Bathing, Feeding, Dressing Bathing Assistance: Maximum assistance Feeding assistance: Limited assistance Dressing  Assistance: Maximum assistance     Functional Limitations Info  Sight, Hearing, Speech Sight Info: Adequate Hearing Info: Adequate Speech Info: Adequate    SPECIAL CARE FACTORS FREQUENCY                       Contractures      Additional Factors Info  Code Status, Allergies, Psychotropic Code Status Info: Full Allergies Info: No known allergies Psychotropic Info: Lexapro , Seroquel          Current Medications (04/15/2024):  This is the current hospital active medication list Current Facility-Administered Medications  Medication Dose Route Frequency Provider Last Rate Last Admin   acetaminophen  (TYLENOL ) tablet 650 mg  650 mg Oral Q6H PRN Emokpae, Ejiroghene E, MD       Or   acetaminophen  (TYLENOL ) suppository 650 mg  650 mg Rectal Q6H PRN Emokpae, Ejiroghene E, MD       ceFEPIme (MAXIPIME) 2 g in sodium chloride  0.9 % 100 mL IVPB  2 g Intravenous Q12H Niels Kayla FALCON, RPH 200 mL/hr at 04/15/24 0428 2 g at 04/15/24 0428   Chlorhexidine  Gluconate Cloth 2 % PADS 6 each  6 each Topical Q0600 Emokpae, Ejiroghene E, MD   6 each at 04/15/24 0448   donepezil  (ARICEPT ) tablet 10 mg  10 mg Oral QHS Emokpae, Ejiroghene E, MD   10 mg at 04/14/24 2131   donepezil  (ARICEPT ) tablet 5 mg  5 mg Oral QHS Emokpae, Ejiroghene E, MD   5 mg at  04/14/24 2132   heparin injection 5,000 Units  5,000 Units Subcutaneous Q8H Emokpae, Ejiroghene E, MD   5,000 Units at 04/15/24 0448   memantine  (NAMENDA ) tablet 10 mg  10 mg Oral BID Emokpae, Ejiroghene E, MD   10 mg at 04/15/24 0910   metroNIDAZOLE (FLAGYL) IVPB 500 mg  500 mg Intravenous Q12H Emokpae, Ejiroghene E, MD   Stopped at 04/15/24 0415   ondansetron  (ZOFRAN ) tablet 4 mg  4 mg Oral Q6H PRN Emokpae, Ejiroghene E, MD       Or   ondansetron  (ZOFRAN ) injection 4 mg  4 mg Intravenous Q6H PRN Emokpae, Ejiroghene E, MD       Oral care mouth rinse  15 mL Mouth Rinse PRN Bryn Bernardino NOVAK, MD       polyethylene glycol (MIRALAX  / GLYCOLAX ) packet 17 g   17 g Oral Daily PRN Emokpae, Ejiroghene E, MD       sodium zirconium cyclosilicate (LOKELMA) packet 10 g  10 g Oral TID Bryn Bernardino NOVAK, MD   10 g at 04/15/24 9089     Discharge Medications: Please see discharge summary for a list of discharge medications.  Relevant Imaging Results:  Relevant Lab Results:   Additional Information    [place] Saddie Kim, LCSW

## 2024-04-15 NOTE — Plan of Care (Signed)
   Problem: Activity: Goal: Risk for activity intolerance will decrease Outcome: Progressing   Problem: Coping: Goal: Level of anxiety will decrease Outcome: Progressing   Problem: Safety: Goal: Ability to remain free from injury will improve Outcome: Progressing

## 2024-04-16 DIAGNOSIS — T68XXXA Hypothermia, initial encounter: Secondary | ICD-10-CM | POA: Diagnosis not present

## 2024-04-16 LAB — BASIC METABOLIC PANEL WITH GFR
Anion gap: 9 (ref 5–15)
BUN: 24 mg/dL — ABNORMAL HIGH (ref 8–23)
CO2: 26 mmol/L (ref 22–32)
Calcium: 9.6 mg/dL (ref 8.9–10.3)
Chloride: 103 mmol/L (ref 98–111)
Creatinine, Ser: 1.4 mg/dL — ABNORMAL HIGH (ref 0.44–1.00)
GFR, Estimated: 38 mL/min — ABNORMAL LOW (ref >60)
Glucose, Bld: 103 mg/dL — ABNORMAL HIGH (ref 70–99)
Potassium: 3.6 mmol/L (ref 3.5–5.1)
Sodium: 138 mmol/L (ref 135–145)

## 2024-04-16 LAB — T4, FREE: Free T4: 1.07 ng/dL (ref 0.61–1.12)

## 2024-04-16 LAB — CORTISOL-AM, BLOOD: Cortisol - AM: 14 ug/dL (ref 6.7–22.6)

## 2024-04-16 MED ORDER — AMOXICILLIN-POT CLAVULANATE 875-125 MG PO TABS: 1.0000 | ORAL_TABLET | Freq: Two times a day (BID) | ORAL | 0 refills | Status: DC

## 2024-04-16 MED ORDER — AMLODIPINE BESYLATE 10 MG PO TABS: 10.0000 mg | ORAL_TABLET | Freq: Every day | ORAL | 0 refills | Status: DC

## 2024-04-16 MED ORDER — AMLODIPINE BESYLATE 5 MG PO TABS
10.0000 mg | ORAL_TABLET | Freq: Every day | ORAL | Status: DC
  Administered 2024-04-16: 10 mg via ORAL
  Filled 2024-04-16: qty 2

## 2024-04-16 NOTE — Discharge Summary (Signed)
 Physician Discharge Summary  Doris Riley FMW:[id number] DOB: 1941/05/26 DOA: 04/13/2024  PCP: System, Provider Not In  Admit date: 04/13/2024  Discharge date: 04/16/2024  Admitted From:SNF  Disposition:  SNF  Recommendations for Outpatient Follow-up:  Follow up with PCP in 1-2 weeks Follow-up BMP in 1 week Continue to remain on medications as noted below and avoid ACE inhibitor for now  Home Health: None  Equipment/Devices: None none  Discharge Condition:Stable  CODE STATUS: Full  Diet recommendation: Renal diet.  Brief/Interim Summary: Doris Riley is an 83 y.o. female with medical history significant for dementia, hypertension, depression brought from long term care at [facility] with reports of lethargy and hypothermia that was noticed this morning.  In the time of my evaluation patient is awake, she is able to talk, but due to significant dementia history is limited.  Patient was noted to have significant hypothermia which has now resolved and it appears to be related to acute diverticulitis which is overall improving.  She is tolerating diet and has been transition to oral Augmentin and blood cultures have demonstrated no significant growth.  TSH appears to be elevated, but T4 within normal limits and she will need follow-up of BMP in the next 1 week.  She will remain off of ACE inhibitor and has been recommended to remain on renal diet.  No other acute events or concerns noted.  Discharge Diagnoses:  Principal Problem:   Hypothermia Active Problems:   Acute diverticulitis   Dementia with behavioral disturbance [facility])   Essential hypertension  Principal discharge diagnosis: Hypothermia in the setting of acute diverticulitis.  Discharge Instructions  Discharge Instructions     Diet - low sodium heart healthy   Complete by: As directed    Increase activity slowly   Complete by: As directed       Allergies as of 04/16/2024   No Known Allergies       Medication List     STOP taking these medications    amLODipine -benazepril  10-40 MG capsule Commonly known as: LOTREL   escitalopram  10 MG tablet Commonly known as: LEXAPRO    QUEtiapine  25 MG tablet Commonly known as: SEROquel        TAKE these medications    acetaminophen  650 MG CR tablet Commonly known as: TYLENOL  Take 650 mg by mouth 3 (three) times daily.   amLODipine  10 MG tablet Commonly known as: NORVASC  Take 1 tablet (10 mg total) by mouth daily.   amoxicillin-clavulanate 875-125 MG tablet Commonly known as: AUGMENTIN Take 1 tablet by mouth every 12 (twelve) hours for 8 days.   atorvastatin  10 MG tablet Commonly known as: LIPITOR Take 1 tablet (10 mg total) by mouth daily.   donepezil  10 MG tablet Commonly known as: ARICEPT  Take 1 tablet (10 mg total) by mouth at bedtime.   donepezil  5 MG tablet Commonly known as: ARICEPT  Take 5 mg by mouth at bedtime.   FUROSEMIDE PO Take 10 mg by mouth every morning.   memantine  10 MG tablet Commonly known as: NAMENDA  Take 1 tablet (10 mg total) by mouth 2 (two) times daily.   ondansetron  4 MG disintegrating tablet Commonly known as: ZOFRAN -ODT Take 4 mg by mouth every 8 (eight) hours as needed for nausea or vomiting.   senna-docusate 8.6-50 MG tablet Commonly known as: Senokot-S Take 1 tablet by mouth 2 (two) times daily.        No Known Allergies  Consultations: None   Procedures/Studies: CT ABDOMEN PELVIS WO CONTRAST Result Date:  04/13/2024 CLINICAL DATA:  General abdominal pain EXAM: CT ABDOMEN AND PELVIS WITHOUT CONTRAST TECHNIQUE: Multidetector CT imaging of the abdomen and pelvis was performed following the standard protocol without IV contrast. RADIATION DOSE REDUCTION: This exam was performed according to the departmental dose-optimization program which includes automated exposure control, adjustment of the mA and/or kV according to patient size and/or use of iterative reconstruction technique.  COMPARISON:  None available FINDINGS: Lower chest: No acute abnormality Hepatobiliary: No focal hepatic abnormality. Gallbladder unremarkable. Pancreas: No focal abnormality or ductal dilatation. Spleen: No focal abnormality.  Normal size. Adrenals/Urinary Tract: No adrenal abnormality. No focal renal abnormality. No stones or hydronephrosis. Foley catheter in the bladder with bladder decompressed. Stomach/Bowel: Left colonic diverticulosis. Slight haziness noted around the mid descending colon as well as the mid sigmoid colon concerning for early diverticulitis. Stomach and small bowel decompressed. Vascular/Lymphatic: Aortic atherosclerosis. No evidence of aneurysm or adenopathy. Reproductive: Prior hysterectomy.  No adnexal masses. Other: No free fluid or free air. Musculoskeletal: No acute bony abnormality IMPRESSION: Diffuse left colonic diverticulosis. Slight haziness noted adjacent to the distal descending colon and mid sigmoid colon concerning for early active diverticulitis. No complicating feature. Aortic atherosclerosis. Electronically Signed   By: Franky Crease M.D.   On: 04/13/2024 16:35   DG Chest Port 1 View Result Date: 04/13/2024 CLINICAL DATA:  Questionable sepsis EXAM: PORTABLE CHEST 1 VIEW COMPARISON:  05/22/2011 FINDINGS: Normal cardiac silhouette. Ectatic aorta. Low lung volumes. Chronic bronchitic markings. No focal consolidation. No pneumothorax No acute osseous abnormality. IMPRESSION: Low lung volumes and chronic bronchitic markings. No acute findings. Electronically Signed   By: Jackquline Boxer M.D.   On: 04/13/2024 14:36     Discharge Exam: Vitals:   04/16/24 0009 04/16/24 0350  BP: (!) 168/79 (!) 176/94  Pulse: 87 92  Resp: 12 14  Temp: 98.1 F (36.7 C) 97.8 F (36.6 C)  SpO2: 99% 100%   Vitals:   04/15/24 1620 04/15/24 1946 04/16/24 0009 04/16/24 0350  BP: (!) 136/100 130/79 (!) 168/79 (!) 176/94  Pulse: 76  87 92  Resp: 17 16 12 14   Temp:  97.8 F (36.6 C) 98.1  F (36.7 C) 97.8 F (36.6 C)  TempSrc:  Oral Oral   SpO2: 95% 95% 99% 100%  Weight:      Height:        General: Pt is alert, awake, not in acute distress Cardiovascular: RRR, S1/S2 +, no rubs, no gallops Respiratory: CTA bilaterally, no wheezing, no rhonchi Abdominal: Soft, NT, ND, bowel sounds + Extremities: no edema, no cyanosis    The results of significant diagnostics from this hospitalization (including imaging, microbiology, ancillary and laboratory) are listed below for reference.     Microbiology: Recent Results (from the past 240 hours)  Blood Culture (routine x 2)     Status: None (Preliminary result)   Collection Time: 04/13/24  2:47 PM   Specimen: BLOOD  Result Value Ref Range Status   Specimen Description BLOOD BLOOD LEFT HAND  Final   Special Requests   Final    BOTTLES DRAWN AEROBIC AND ANAEROBIC Blood Culture results may not be optimal due to an inadequate volume of blood received in culture bottles   Culture   Final    NO GROWTH 3 DAYS Performed at [facility], [address], [place], [place] [address]    Report Status PENDING  Incomplete  Blood Culture (routine x 2)     Status: None (Preliminary result)   Collection Time: 04/13/24  2:47 PM   Specimen: BLOOD  Result Value Ref Range Status   Specimen Description BLOOD BLOOD RIGHT ARM  Final   Special Requests   Final    BOTTLES DRAWN AEROBIC AND ANAEROBIC Blood Culture results may not be optimal due to an inadequate volume of blood received in culture bottles   Culture   Final    NO GROWTH 3 DAYS Performed at [facility], [address], [place], [place] [address]    Report Status PENDING  Incomplete  MRSA Next Gen by PCR, Nasal     Status: None   Collection Time: 04/13/24  5:40 PM   Specimen: Nasal Mucosa; Nasal Swab  Result Value Ref Range Status   MRSA by PCR Next Gen NOT DETECTED NOT DETECTED Final    Comment: (NOTE) The GeneXpert MRSA Assay (FDA approved for NASAL specimens only), is one  component of a comprehensive MRSA colonization surveillance program. It is not intended to diagnose MRSA infection nor to guide or monitor treatment for MRSA infections. Test performance is not FDA approved in patients less than 67 years old. Performed at [facility], [address], [place], [place] [address]      Labs: BNP (last 3 results) No results for input(s): BNP in the last 8760 hours. Basic Metabolic Panel: Recent Labs  Lab 04/13/24 1447 04/14/24 0401 04/14/24 0641 04/14/24 0812 04/14/24 1551 04/14/24 2122 04/15/24 0456 04/16/24 0429  NA 136 138  --   --  137  --  137 138  K 5.5* 6.5*   < > 5.7* 5.9* 5.5* 4.6 3.6  CL 102 108  --   --  105  --  106 103  CO2 23 24  --   --  24  --  23 26  GLUCOSE 78 83  --   --  86  --  97 103*  BUN 35* 27*  --   --  26*  --  24* 24*  CREATININE 1.51* 1.40*  --   --  1.46*  --  1.58* 1.40*  CALCIUM  9.8 9.2  --   --  9.3  --  9.5 9.6   < > = values in this interval not displayed.   Liver Function Tests: Recent Labs  Lab 04/13/24 1447  AST 52*  ALT 82*  ALKPHOS 190*  BILITOT 0.5  PROT 7.4  ALBUMIN 3.6   Recent Labs  Lab 04/13/24 1559  LIPASE 61*   No results for input(s): AMMONIA in the last 168 hours. CBC: Recent Labs  Lab 04/13/24 1447 04/14/24 0304 04/15/24 0456  WBC 5.2 4.6 5.5  NEUTROABS 3.8  --   --   HGB 11.4* 10.5* 10.8*  HCT 35.3* 33.1* 35.8*  MCV 86.9 88.3 88.8  PLT 230 218 217   Cardiac Enzymes: No results for input(s): CKTOTAL, CKMB, CKMBINDEX, TROPONINI in the last 168 hours. BNP: Invalid input(s): POCBNP CBG: Recent Labs  Lab 04/13/24 1450 04/14/24 1120  GLUCAP 90 84   D-Dimer No results for input(s): DDIMER in the last 72 hours. Hgb A1c No results for input(s): HGBA1C in the last 72 hours. Lipid Profile No results for input(s): CHOL, HDL, LDLCALC, TRIG, CHOLHDL, LDLDIRECT in the last 72 hours. Thyroid  function studies Recent Labs    04/13/24 1447  TSH  6.944*   Anemia work up No results for input(s): VITAMINB12, FOLATE, FERRITIN, TIBC, IRON, RETICCTPCT in the last 72 hours. Urinalysis    Component Value Date/Time   COLORURINE YELLOW 04/13/2024 1639  APPEARANCEUR CLEAR 04/13/2024 1639   LABSPEC 1.009 04/13/2024 1639   PHURINE 5.0 04/13/2024 1639   GLUCOSEU NEGATIVE 04/13/2024 1639   HGBUR NEGATIVE 04/13/2024 1639   BILIRUBINUR NEGATIVE 04/13/2024 1639   KETONESUR NEGATIVE 04/13/2024 1639   PROTEINUR 30 (A) 04/13/2024 1639   UROBILINOGEN 1.0 04/22/2011 1555   NITRITE NEGATIVE 04/13/2024 1639   LEUKOCYTESUR NEGATIVE 04/13/2024 1639   Sepsis Labs Recent Labs  Lab 04/13/24 1447 04/14/24 0304 04/15/24 0456  WBC 5.2 4.6 5.5   Microbiology Recent Results (from the past 240 hours)  Blood Culture (routine x 2)     Status: None (Preliminary result)   Collection Time: 04/13/24  2:47 PM   Specimen: BLOOD  Result Value Ref Range Status   Specimen Description BLOOD BLOOD LEFT HAND  Final   Special Requests   Final    BOTTLES DRAWN AEROBIC AND ANAEROBIC Blood Culture results may not be optimal due to an inadequate volume of blood received in culture bottles   Culture   Final    NO GROWTH 3 DAYS Performed at [facility], [address] [place], [place] [address]    Report Status PENDING  Incomplete  Blood Culture (routine x 2)     Status: None (Preliminary result)   Collection Time: 04/13/24  2:47 PM   Specimen: BLOOD  Result Value Ref Range Status   Specimen Description BLOOD BLOOD RIGHT ARM  Final   Special Requests   Final    BOTTLES DRAWN AEROBIC AND ANAEROBIC Blood Culture results may not be optimal due to an inadequate volume of blood received in culture bottles   Culture   Final    NO GROWTH 3 DAYS Performed at [facility], [address], [place], [place] [address]    Report Status PENDING  Incomplete  MRSA Next Gen by PCR, Nasal     Status: None   Collection Time: 04/13/24  5:40 PM   Specimen: Nasal  Mucosa; Nasal Swab  Result Value Ref Range Status   MRSA by PCR Next Gen NOT DETECTED NOT DETECTED Final    Comment: (NOTE) The GeneXpert MRSA Assay (FDA approved for NASAL specimens only), is one component of a comprehensive MRSA colonization surveillance program. It is not intended to diagnose MRSA infection nor to guide or monitor treatment for MRSA infections. Test performance is not FDA approved in patients less than 90 years old. Performed at [facility], [address], [place], [place] [address]      Time coordinating discharge: 35 minutes  SIGNED:   Adron JONETTA Fairly, DO [facility] 04/16/2024, 11:00 AM  If 7PM-7AM, please contact night-coverage [email or web address]

## 2024-04-16 NOTE — TOC Transition Note (Addendum)
 Transition of Care [facility]) - Discharge Note   Patient Details  Name: Doris Riley MRN: [id number] Date of Birth: Sep 06, 1941  Transition of Care [facility]) CM/SW Contact:  Mcarthur Saddie Kim, LCSW Phone Number: 04/16/2024, 1:02 PM   Clinical Narrative: Pt d/c today back to [facility] Family (Faith) and facility aware and agreeable. D/C summary sent to SNF. RN given number to call report. Faith requests transport with [facility] waiver on chart. LCSW to arrange [facility] transport.    Update: Per RN, pt will need EMS. LCSW updated Faith and will arrange with [facility].    Final next level of care: Skilled Nursing Facility Barriers to Discharge: Barriers Resolved   Patient Goals and CMS Choice Patient states their goals for this hospitalization and ongoing recovery are:: return back to [facility].gov Compare Post Acute Care list provided to:: Patient Represenative (must comment) (Faith - cousin)        Discharge Placement                Patient to be transferred to facility by: [facility] Name of family member notified: Faith Patient and family notified of of transfer: 04/16/24  Discharge Plan and Services Additional resources added to the After Visit Summary for       Post Acute Care Choice: Skilled Nursing Facility, Durable Medical Equipment                               Social Drivers of Health (SDOH) Interventions SDOH Screenings   Food Insecurity: No Food Insecurity (04/13/2024)  Housing: Low Risk  (04/13/2024)  Transportation Needs: No Transportation Needs (04/13/2024)  Utilities: At Risk (04/13/2024)  Depression (PHQ2-9): Medium Risk (12/09/2019)  Social Connections: Patient Unable To Answer (04/13/2024)  Tobacco Use: Low Risk  (04/13/2024)     Readmission Risk Interventions     No data to display

## 2024-04-16 NOTE — Care Management Important Message (Signed)
 Important Message  Patient Details  Name: Doris Riley MRN: [id number] Date of Birth: 1940/11/17   Important Message Given:  Yes - Medicare IM     Trishelle Devora L Azel Gumina 04/16/2024, 11:03 AM

## 2024-04-16 NOTE — Plan of Care (Signed)

## 2024-04-16 NOTE — Progress Notes (Signed)
 Report called to [employer] , LPN at [facility]

## 2024-04-17 ENCOUNTER — Other Ambulatory Visit: Payer: Self-pay

## 2024-04-17 ENCOUNTER — Emergency Department (HOSPITAL_COMMUNITY)

## 2024-04-17 ENCOUNTER — Observation Stay (HOSPITAL_COMMUNITY)
Admission: EM | Admit: 2024-04-17 | Discharge: 2024-04-20 | Disposition: A | Discharge status: Skilled Nursing Facility | Attending: Internal Medicine | Admitting: Internal Medicine

## 2024-04-17 ENCOUNTER — Encounter (HOSPITAL_COMMUNITY): Payer: Self-pay

## 2024-04-17 DIAGNOSIS — K5792 Diverticulitis of intestine, part unspecified, without perforation or abscess without bleeding: Secondary | ICD-10-CM | POA: Diagnosis not present

## 2024-04-17 DIAGNOSIS — G9341 Metabolic encephalopathy: Secondary | ICD-10-CM | POA: Insufficient documentation

## 2024-04-17 DIAGNOSIS — R791 Abnormal coagulation profile: Secondary | ICD-10-CM | POA: Insufficient documentation

## 2024-04-17 DIAGNOSIS — N1832 Chronic kidney disease, stage 3b: Secondary | ICD-10-CM | POA: Diagnosis not present

## 2024-04-17 DIAGNOSIS — R55 Syncope and collapse: Secondary | ICD-10-CM | POA: Diagnosis present

## 2024-04-17 DIAGNOSIS — E871 Hypo-osmolality and hyponatremia: Secondary | ICD-10-CM | POA: Insufficient documentation

## 2024-04-17 DIAGNOSIS — I129 Hypertensive chronic kidney disease with stage 1 through stage 4 chronic kidney disease, or unspecified chronic kidney disease: Secondary | ICD-10-CM | POA: Diagnosis not present

## 2024-04-17 DIAGNOSIS — F03918 Unspecified dementia, unspecified severity, with other behavioral disturbance: Secondary | ICD-10-CM | POA: Diagnosis not present

## 2024-04-17 DIAGNOSIS — J690 Pneumonitis due to inhalation of food and vomit: Secondary | ICD-10-CM | POA: Diagnosis not present

## 2024-04-17 DIAGNOSIS — G9340 Encephalopathy, unspecified: Secondary | ICD-10-CM | POA: Diagnosis present

## 2024-04-17 DIAGNOSIS — R400 Somnolence: Principal | ICD-10-CM

## 2024-04-17 DIAGNOSIS — I10 Essential (primary) hypertension: Secondary | ICD-10-CM | POA: Diagnosis present

## 2024-04-17 LAB — CBC
HCT: 32.8 % — ABNORMAL LOW (ref 36.0–46.0)
Hemoglobin: 10.3 g/dL — ABNORMAL LOW (ref 12.0–15.0)
MCH: 27.1 pg (ref 26.0–34.0)
MCHC: 31.4 g/dL (ref 30.0–36.0)
MCV: 86.3 fL (ref 80.0–100.0)
Platelets: 195 10*3/uL (ref 150–400)
RBC: 3.8 MIL/uL — ABNORMAL LOW (ref 3.87–5.11)
RDW: 13.8 % (ref 11.5–15.5)
WBC: 10.5 10*3/uL (ref 4.0–10.5)
nRBC: 0.3 % — ABNORMAL HIGH (ref 0.0–0.2)

## 2024-04-17 LAB — URINALYSIS, ROUTINE W REFLEX MICROSCOPIC
Bilirubin Urine: NEGATIVE
Glucose, UA: 50 mg/dL — AB
Ketones, ur: NEGATIVE mg/dL
Leukocytes,Ua: NEGATIVE
Nitrite: NEGATIVE
Protein, ur: 100 mg/dL — AB
Specific Gravity, Urine: 1.015 (ref 1.005–1.030)
pH: 5 (ref 5.0–8.0)

## 2024-04-17 LAB — COMPREHENSIVE METABOLIC PANEL WITH GFR
ALT: 30 U/L (ref 0–44)
AST: 29 U/L (ref 15–41)
Albumin: 2.7 g/dL — ABNORMAL LOW (ref 3.5–5.0)
Alkaline Phosphatase: 138 U/L — ABNORMAL HIGH (ref 38–126)
Anion gap: 9 (ref 5–15)
BUN: 23 mg/dL (ref 8–23)
CO2: 21 mmol/L — ABNORMAL LOW (ref 22–32)
Calcium: 7.8 mg/dL — ABNORMAL LOW (ref 8.9–10.3)
Chloride: 102 mmol/L (ref 98–111)
Creatinine, Ser: 1.46 mg/dL — ABNORMAL HIGH (ref 0.44–1.00)
GFR, Estimated: 36 mL/min — ABNORMAL LOW (ref >60)
Glucose, Bld: 101 mg/dL — ABNORMAL HIGH (ref 70–99)
Potassium: 4.3 mmol/L (ref 3.5–5.1)
Sodium: 132 mmol/L — ABNORMAL LOW (ref 135–145)
Total Bilirubin: 0.9 mg/dL (ref 0.0–1.2)
Total Protein: 6.4 g/dL — ABNORMAL LOW (ref 6.5–8.1)

## 2024-04-17 LAB — D-DIMER, QUANTITATIVE: D-Dimer, Quant: 0.92 ug{FEU}/mL — ABNORMAL HIGH (ref 0.00–0.50)

## 2024-04-17 LAB — LIPASE, BLOOD: Lipase: 37 U/L (ref 11–51)

## 2024-04-17 LAB — TROPONIN I (HIGH SENSITIVITY)
Troponin I (High Sensitivity): 12 ng/L (ref <18)
Troponin I (High Sensitivity): 12 ng/L (ref <18)

## 2024-04-17 LAB — CBG MONITORING, ED: Glucose-Capillary: 117 mg/dL — ABNORMAL HIGH (ref 70–99)

## 2024-04-17 LAB — LACTIC ACID, PLASMA: Lactic Acid, Venous: 1.9 mmol/L (ref 0.5–1.9)

## 2024-04-17 LAB — T3, FREE: T3, Free: 2.3 pg/mL (ref 2.0–4.4)

## 2024-04-17 LAB — GLUCOSE, CAPILLARY: Glucose-Capillary: 140 mg/dL — ABNORMAL HIGH (ref 70–99)

## 2024-04-17 MED ORDER — ONDANSETRON HCL 4 MG/2ML IJ SOLN: 4.0000 mg | Freq: Four times a day (QID) | INTRAMUSCULAR | Status: DC | PRN

## 2024-04-17 MED ORDER — ONDANSETRON HCL 4 MG PO TABS: 4.0000 mg | ORAL_TABLET | Freq: Four times a day (QID) | ORAL | Status: DC | PRN

## 2024-04-17 MED ORDER — ACETAMINOPHEN 325 MG PO TABS
650.0000 mg | ORAL_TABLET | Freq: Four times a day (QID) | ORAL | Status: DC | PRN
Start: 2024-04-17 — End: 2024-04-20

## 2024-04-17 MED ORDER — CHLORHEXIDINE GLUCONATE CLOTH 2 % EX PADS
6.0000 | MEDICATED_PAD | Freq: Every day | CUTANEOUS | Status: DC
  Administered 2024-04-17 – 2024-04-20 (×4): 6 via TOPICAL

## 2024-04-17 MED ORDER — POLYETHYLENE GLYCOL 3350 17 G PO PACK: 17.0000 g | PACK | Freq: Every day | ORAL | Status: DC | PRN

## 2024-04-17 MED ORDER — DEXTROSE-SODIUM CHLORIDE 5-0.9 % IV SOLN
INTRAVENOUS | Status: AC
Start: 2024-04-17 — End: 2024-04-18

## 2024-04-17 MED ORDER — AMOXICILLIN-POT CLAVULANATE 875-125 MG PO TABS
1.0000 | ORAL_TABLET | Freq: Two times a day (BID) | ORAL | Status: DC
  Administered 2024-04-18 – 2024-04-20 (×5): 1 via ORAL
  Filled 2024-04-17 (×6): qty 1

## 2024-04-17 MED ORDER — ACETAMINOPHEN 650 MG RE SUPP: 650.0000 mg | Freq: Four times a day (QID) | RECTAL | Status: DC | PRN

## 2024-04-17 MED ORDER — SENNOSIDES-DOCUSATE SODIUM 8.6-50 MG PO TABS
1.0000 | ORAL_TABLET | Freq: Two times a day (BID) | ORAL | Status: DC
  Administered 2024-04-18 – 2024-04-20 (×4): 1 via ORAL
  Filled 2024-04-17 (×6): qty 1

## 2024-04-17 MED ORDER — SODIUM CHLORIDE 0.9 % IV BOLUS
500.0000 mL | Freq: Once | INTRAVENOUS | Status: AC
  Administered 2024-04-17: 500 mL via INTRAVENOUS

## 2024-04-17 MED ORDER — AMLODIPINE BESYLATE 5 MG PO TABS: 10.0000 mg | ORAL_TABLET | Freq: Every day | ORAL | Status: DC

## 2024-04-17 MED ORDER — HEPARIN SODIUM (PORCINE) 5000 UNIT/ML IJ SOLN
5000.0000 [IU] | Freq: Three times a day (TID) | INTRAMUSCULAR | Status: DC
  Administered 2024-04-17 – 2024-04-20 (×8): 5000 [IU] via SUBCUTANEOUS
  Filled 2024-04-17 (×8): qty 1

## 2024-04-17 NOTE — ED Triage Notes (Addendum)
 Pt BIB [facility] from [facility]. Pt was recently d/c'd from the hospital for diverticulitis. This morning pt was at her baseline and then at breakfast pt vomited and went unresponsive. EMS arrived and noted pt to be responsive to pain at that time. On arrival to the ED pt is responding to questions, but is lethargic and will fall asleep easily when not stimulated. Confused per hx of dementia.   The SNF reports that pt was allegedly given Seroquel  during her hospitalization that caused pt to be altered when she returned. They request that pt not be given any antipsychotics and for pt to have CVA, AMI, and PE ruled out before return.

## 2024-04-17 NOTE — Assessment & Plan Note (Signed)
 Recent hospitalization-treated with IV antibiotics and discharged on Augmentin. -Complete course of Augmentin

## 2024-04-17 NOTE — ED Notes (Signed)
 Discussed needed for I&O cath to obtain urine and due to urinary retention as evidence by bladder scan. Family would like to defer I&O at this time and see if pt will void this evening on her own.

## 2024-04-17 NOTE — Assessment & Plan Note (Signed)
 Term nursing home resident, non-ambulatory, advanced dementia. Seroquel  was discontinued during recent hospitalization.

## 2024-04-17 NOTE — ED Provider Notes (Signed)
 [place] EMERGENCY DEPARTMENT AT [facility] Provider Note   CSN: [id number] Arrival date & time: 04/17/24  1230     Patient presents with: Altered Mental Status   Doris Riley is a 83 y.o. female with a history significant for dementia, hypertension, depressionHyperlipidemia and anemia who was just discharged from this hospital yesterday where she was treated for acute diverticulitis.  She was discharged to her nursing home and was at her baseline, son at bedside reports she can walk independently and feed herself but is generally very confused but is conversive, noted yesterday evening that she was more confused than normal.  This morning at breakfast she was reportedly at her baseline but had an episode of vomiting and went unresponsive.  When EMS arrived patient was responding to pain but was very lethargic and continues to fall asleep.   The history is provided by the nursing home, medical records and a relative (son at bedside).       Prior to Admission medications   Medication Sig Start Date End Date Taking? Authorizing Provider  acetaminophen  (TYLENOL ) 650 MG CR tablet Take 650 mg by mouth 3 (three) times daily.   Yes [provider]  amLODipine  (NORVASC ) 10 MG tablet Take 1 tablet (10 mg total) by mouth daily. 04/16/24 05/16/24 Yes Shah, Pratik D, DO  amoxicillin-clavulanate (AUGMENTIN) 875-125 MG tablet Take 1 tablet by mouth every 12 (twelve) hours for 8 days. 04/16/24 04/24/24 Yes Shah, Pratik D, DO  atorvastatin  (LIPITOR) 10 MG tablet Take 1 tablet (10 mg total) by mouth daily. 12/01/19  Yes Corum, Olam CROME, MD  donepezil  (ARICEPT ) 10 MG tablet Take 1 tablet (10 mg total) by mouth at bedtime. Patient taking differently: Take 10 mg by mouth at bedtime. Take with 5 mg for total of 15 mg 12/01/19  Yes Corum, Olam CROME, MD  donepezil  (ARICEPT ) 5 MG tablet Take 5 mg by mouth at bedtime. Take with 10 mg for total of 15 mg 01/24/24  Yes [provider]  furosemide  (LASIX) 20 MG tablet Take 10 mg by mouth daily. 03/15/24  Yes [provider]  memantine  (NAMENDA ) 10 MG tablet Take 1 tablet (10 mg total) by mouth 2 (two) times daily. 12/01/19  Yes Corum, Olam CROME, MD  ondansetron  (ZOFRAN -ODT) 4 MG disintegrating tablet Take 4 mg by mouth every 8 (eight) hours as needed for nausea or vomiting.   Yes [provider]  senna-docusate (SENOKOT-S) 8.6-50 MG tablet Take 1 tablet by mouth 2 (two) times daily.   Yes [provider]    Allergies: Tape    Review of Systems  Unable to perform ROS: Mental status change  Constitutional:  Positive for activity change.  Gastrointestinal:  Positive for vomiting.  Psychiatric/Behavioral:  Positive for confusion.     Updated Vital Signs BP (!) 130/52   Pulse 66   Temp 99.3 F (37.4 C) (Rectal)   Resp 16   Ht 5' 6 (1.676 m)   Wt 105 kg   SpO2 99%   BMI 37.36 kg/m   Physical Exam Vitals and nursing note reviewed.  Constitutional:      Appearance: She is well-developed.  HENT:     Head: Normocephalic and atraumatic.   Eyes:     Conjunctiva/sclera: Conjunctivae normal.     Pupils: Pupils are equal, round, and reactive to light.     Comments: Unable to access EOM's, pt not cooperative for this portion of exam   Cardiovascular:  Rate and Rhythm: Normal rate and regular rhythm.     Heart sounds: Normal heart sounds.  Pulmonary:     Effort: Pulmonary effort is normal.     Breath sounds: Normal breath sounds. No wheezing.  Abdominal:     General: Bowel sounds are normal.     Palpations: Abdomen is soft.     Tenderness: There is no abdominal tenderness. There is no guarding.   Musculoskeletal:        General: No swelling or deformity. Normal range of motion.     Cervical back: Normal range of motion.   Skin:    General: Skin is warm and dry.   Neurological:     Mental Status: She is lethargic.     Comments: Responds briefly to tactile stimulus and voice. Full neuro exam  difficult, pt unable to follow commands.  She will squeeze hands into a very soft fist,  weak but equal.  Moves feet and ankles,  soft resistance when asked to plantar flex.    (all labs ordered are listed, but only abnormal results are displayed) Labs Reviewed  COMPREHENSIVE METABOLIC PANEL WITH GFR - Abnormal; Notable for the following components:      Result Value   Sodium 132 (*)    CO2 21 (*)    Glucose, Bld 101 (*)    Creatinine, Ser 1.46 (*)    Calcium  7.8 (*)    Total Protein 6.4 (*)    Albumin 2.7 (*)    Alkaline Phosphatase 138 (*)    GFR, Estimated 36 (*)    All other components within normal limits  CBC - Abnormal; Notable for the following components:   RBC 3.80 (*)    Hemoglobin 10.3 (*)    HCT 32.8 (*)    nRBC 0.3 (*)    All other components within normal limits  D-DIMER, QUANTITATIVE - Abnormal; Notable for the following components:   D-Dimer, Quant 0.92 (*)    All other components within normal limits  CBG MONITORING, ED - Abnormal; Notable for the following components:   Glucose-Capillary 117 (*)    All other components within normal limits  LACTIC ACID, PLASMA  LIPASE, BLOOD  URINALYSIS, ROUTINE W REFLEX MICROSCOPIC  CBG MONITORING, ED  TROPONIN I (HIGH SENSITIVITY)  TROPONIN I (HIGH SENSITIVITY)    EKG: EKG Interpretation Date/Time:  Thursday April 17 2024 12:45:05 EDT Ventricular Rate:  62 PR Interval:  199 QRS Duration:  163 QT Interval:  424 QTC Calculation: 431 R Axis:   10  Text Interpretation: Sinus rhythm Left bundle branch block No significant change since last tracing Confirmed by Towana Sharper [phone]) on 04/17/2024 12:49:46 PM  Radiology: CT Head Wo Contrast Result Date: 04/17/2024 CLINICAL DATA:  Altered mental status. EXAM: CT HEAD WITHOUT CONTRAST TECHNIQUE: Contiguous axial images were obtained from the base of the skull through the vertex without intravenous contrast. RADIATION DOSE REDUCTION: This exam was performed according to  the departmental dose-optimization program which includes automated exposure control, adjustment of the mA and/or kV according to patient size and/or use of iterative reconstruction technique. COMPARISON:  January 13, 2020 FINDINGS: Brain: There is generalized cerebral atrophy with widening of the extra-axial spaces and ventricular dilatation. There are areas of decreased attenuation within the white matter tracts of the supratentorial brain, consistent with microvascular disease changes. A chronic anterolateral right temporal lobe infarct is suspected. Vascular: Moderate to marked severity bilateral cavernous carotid artery calcification is noted. Skull: Normal. Negative for fracture or focal lesion.  Sinuses/Orbits: No acute finding. Other: None. IMPRESSION: 1. Generalized cerebral atrophy with chronic white matter small vessel ischemic changes. 2. Chronic anterolateral right temporal lobe infarct. Nonemergent MRI correlation is recommended. 3. No acute intracranial abnormality. Electronically Signed   By: Suzen Dials M.D.   On: 04/17/2024 14:43   DG Chest Portable 1 View Result Date: 04/17/2024 CLINICAL DATA:  syncope,  ams EXAM: PORTABLE CHEST - 1 VIEW COMPARISON:  April 13, 2024 FINDINGS: Low lung volumes with bronchovascular crowding. No focal airspace consolidation, pleural effusion, or pneumothorax. No cardiomegaly. Aortic atherosclerosis. No acute fracture or destructive lesions. Multilevel thoracic osteophytosis. Bilateral AC joint osteoarthritis. IMPRESSION: Low lung volumes.  Otherwise, no acute cardiopulmonary abnormality. Electronically Signed   By: Rogelia Myers M.D.   On: 04/17/2024 13:42     Procedures   Medications Ordered in the ED  sodium chloride  0.9 % bolus 500 mL ( Intravenous Stopped 04/17/24 1523)    Clinical Course as of 04/17/24 1610  Thu Apr 17, 2024  4722 83 year old female with history of dementia but usually awake and alert here after an unresponsive spell at lunch  today.  Just discharged yesterday after being admitted for lethargy and found to have diverticulitis.  Patient is responsive to stimulation.  Blood pressure low.  Getting lab work imaging.  Will likely need admission to the hospital. [MB]    Clinical Course User Index [MB] Towana Ozell BROCKS, MD                                 Medical Decision Making Patient with altered mental status of unclear etiology.  She was just discharged from here yesterday where she was treated for diverticulitis.  Increasing somnolence with no clear etiology.  Differential diagnosis including CVA, metabolic encephalopathy, head injury although she does not have a history nor does her physical exam suggest trauma.  The LOC was preceded by vomiting, this could represent a vasovagal response, she has recent history of diverticulitis, has completed treatment, worsening infection less likely.  Her vital signs have been stable, no SIRS criteria today.  Amount and/or Complexity of Data Reviewed Labs: ordered.    Details: Labs reviewed including CBC, lactic acid, c-Met lipase troponin and D-dimer.  Her D-dimer age-adjusted allowing for variance essentially normal, she does not have any hypoxia or other vital signs abnormalities suggesting PE.  She does have an elevated creatinine at 1.46, which is relatively normal range.  She has a borderline hypocalcemia today at 7.8. Radiology: ordered.    Details: Chest x-ray negative for aspiration pneumonia.  CT head showing cerebral atrophy, she has a chronic right temporal lobe infarct, no acute findings. ECG/medicine tests: ordered.    Details: Sinus rhythm with left bundle branch block, rate 62. Discussion of management or test interpretation with external provider(s): Discussed with Dr. Pearlean who accepts patient for admission  Risk Decision regarding hospitalization.        Final diagnoses:  Somnolence    ED Discharge Orders     None          Birdena Mliss RIGGERS 04/17/24 1615    Towana Ozell BROCKS, MD 04/17/24 [phone]

## 2024-04-17 NOTE — Assessment & Plan Note (Addendum)
 Currently lethargic.  Per nursing home, vomited this morning and subsequently became unresponsive.  Head CT-chronic ischemic changes, chronic anterior lateral right temporal lobe infarct, nonemergent MRI correlation recommended.  UA pending.  Chest x-ray clear.  Tmax 99.3,.  WBC 10.5 normal lactic acid 1.9..  Not hypoxic.  EKG unchanged.  Troponin 12 x 2.,  D-dimer 0.9 -unremarkable when corrected for age.  Question vasovagal syncope after episode of vomiting, need to rule out infectious etiology for lethargy.  No culprit meds on med list.  Lungs sound clear- doubt aspiration. -N.p.o. till improvement in mental status -Family med bolus given, continue D5 N/s 75cc/hr x 20hrs - Holding donepezil  and memantine  for now - Echocardiogram -Nonemergent MRI when able

## 2024-04-17 NOTE — H&P (Signed)
 History and Physical    Doris Riley FMW:[id number] DOB: [birth year] DOA: 04/17/2024  PCP: System, Provider Not In   Patient coming from: [facility]  I have personally briefly reviewed patient's old medical records in [facility]  Chief Complaint: Syncope  HPI: Doris Riley is a 83 y.o. female with medical history significant for dementia, hypertension, depression. Patient was brought to the ED from facility reports of vomiting and subsequent unresponsiveness.  On the time of my evaluation, patient is lethargic, arouses to voice, follows some directions, patient's son and grandson at bedside. Recent hospitalization 6/22 to 6/25 for hypothermia, acute diverticulitis.  Patient was treated with antibiotics and discharged on Augmentin.  On discharge, Seroquel , Lexapro , and benazepril  were discontinued.  Per family- who saw patient yesterday, patient was at her normal baseline.  This morning she was eating breakfast when she vomited and subsequently became unresponsive.  Per EMS patient responsive to pain.  On arrival to the ED, patient was lethargic and quickly falling asleep.  Per triage notes, nursing home request for patient to have CVA, AMI and PE ruled out before return.  ED Course: Tmax 99.3.  Heart rate 60s.  Respiratory rate 12-20.  Blood pressure systolic 103-134.  O2 sat greater than 95% on room air. WBC 10.5.  Lactic acid 1.9.  Troponin 12.  D-dimer 0.92. Chest x-ray negative for acute abnormality. Head CT- Generalized cerebral atrophy with chronic white matter small vessel ischemic changes. Chronic anterolateral right temporal lobe infarct. Nonemergent MRI correlation is recommended. 500 mL bolus given.  Review of Systems: As per HPI all other systems reviewed and negative.  Past Medical History:  Diagnosis Date   Anemia    mild   Arthritis    Constipation    Dementia (HCC)    Depression    Hyperlipidemia    Hypertension     Past Surgical History:   Procedure Laterality Date   ABDOMINAL HYSTERECTOMY     COLONOSCOPY     >10 years ago   COLONOSCOPY  09/01/2011   Procedure: COLONOSCOPY;  Surgeon: Margo CHRISTELLA Haddock, MD;  Location: [facility];  Service: Endoscopy;  Laterality: N/A;  10:30   HARDWARE REMOVAL Left 04/28/2015   Procedure: HARDWARE REMOVAL LEFT KNEE;  Surgeon: Taft FORBES Minerva, MD;  Location: [facility] ORS;  Service: Orthopedics;  Laterality: Left;   KNEE SURGERY     left, fracture   S/P Hysterectomy     partial   TOTAL KNEE ARTHROPLASTY Left 04/28/2015   Procedure:  LEFT TOTAL KNEE ARTHROPLASTY;  Surgeon: Taft FORBES Minerva, MD;  Location: [facility];  Service: Orthopedics;  Laterality: Left;  depuy implant     reports that she has never smoked. She has never used smokeless tobacco. She reports that she does not drink alcohol and does not use drugs.  Allergies  Allergen Reactions   Tape Other (See Comments)    Skin sensitivity    Family History  Problem Relation Age of Onset   Colon cancer Mother        54s   Prostate cancer Father        57s   GI problems Brother        colostomy, patient not sure why   Colon cancer Brother    Prostate cancer Brother     Prior to Admission medications   Medication Sig Start Date End Date Taking? Authorizing Provider  acetaminophen  (TYLENOL ) 650 MG CR tablet Take 650 mg by mouth 3 (three) times daily.  Yes [provider]  amLODipine  (NORVASC ) 10 MG tablet Take 1 tablet (10 mg total) by mouth daily. 04/16/24 05/16/24 Yes Shah, Pratik D, DO  amoxicillin-clavulanate (AUGMENTIN) 875-125 MG tablet Take 1 tablet by mouth every 12 (twelve) hours for 8 days. 04/16/24 04/24/24 Yes Shah, Pratik D, DO  atorvastatin  (LIPITOR) 10 MG tablet Take 1 tablet (10 mg total) by mouth daily. 12/01/19  Yes Corum, Olam CROME, MD  donepezil  (ARICEPT ) 10 MG tablet Take 1 tablet (10 mg total) by mouth at bedtime. Patient taking differently: Take 10 mg by mouth at bedtime. Take with 5 mg for total of 15 mg 12/01/19   Yes Corum, Olam CROME, MD  donepezil  (ARICEPT ) 5 MG tablet Take 5 mg by mouth at bedtime. Take with 10 mg for total of 15 mg 01/24/24  Yes [provider]  furosemide (LASIX) 20 MG tablet Take 10 mg by mouth daily. 03/15/24  Yes [provider]  memantine  (NAMENDA ) 10 MG tablet Take 1 tablet (10 mg total) by mouth 2 (two) times daily. 12/01/19  Yes Corum, Olam CROME, MD  ondansetron  (ZOFRAN -ODT) 4 MG disintegrating tablet Take 4 mg by mouth every 8 (eight) hours as needed for nausea or vomiting.   Yes [provider]  senna-docusate (SENOKOT-S) 8.6-50 MG tablet Take 1 tablet by mouth 2 (two) times daily.   Yes [provider]    Physical Exam: Vitals:   04/17/24 1345 04/17/24 1400 04/17/24 1436 04/17/24 1527  BP: (!) 134/55   (!) 130/52  Pulse: 68   66  Resp: 16 20  16   Temp:   99.3 F (37.4 C)   TempSrc:   Rectal   SpO2: 96%   99%  Weight:      Height:        Constitutional: Limited exam due to patient's dementia, and lethargy ,  calm, comfortable Vitals:   04/17/24 1345 04/17/24 1400 04/17/24 1436 04/17/24 1527  BP: (!) 134/55   (!) 130/52  Pulse: 68   66  Resp: 16 20  16   Temp:   99.3 F (37.4 C)   TempSrc:   Rectal   SpO2: 96%   99%  Weight:      Height:       Eyes: PERRL, lids and conjunctivae normal ENMT: Mucous membranes are moist.  Neck: normal, supple, no masses, no thyromegaly Respiratory: clear to auscultation bilaterally, no wheezing, no crackles. Normal respiratory effort. No accessory muscle use.  Cardiovascular: Regular rate and rhythm,  murmur present / rubs / gallops. No extremity edema.  Extremities warm Abdomen: no tenderness, no masses palpated. No hepatosplenomegaly. Bowel sounds positive.  Musculoskeletal: no clubbing / cyanosis. No joint deformity upper and lower extremities. Good ROM, no contractures. Normal muscle tone.  Skin: no rashes, lesions, ulcers. No induration Neurologic: When sleeping, likely due to positioning she  has a droop to the left side, but this disappears when she wakes up.  Follows some directions.  Equal grip strength bilaterally.  Complains of bilateral knee pains, not moving lower extremities.  Bedbound at baseline Psychiatric: Lethargic, initially sleeping but aroused to voice, follows some directions, remained awake during the rest of the exam.  Labs on Admission: I have personally reviewed following labs and imaging studies  CBC: Recent Labs  Lab 04/13/24 1447 04/14/24 0304 04/15/24 0456 04/17/24 1318  WBC 5.2 4.6 5.5 10.5  NEUTROABS 3.8  --   --   --   HGB 11.4* 10.5* 10.8* 10.3*  HCT 35.3* 33.1*  35.8* 32.8*  MCV 86.9 88.3 88.8 86.3  PLT 230 218 217 195   Basic Metabolic Panel: Recent Labs  Lab 04/14/24 0401 04/14/24 0641 04/14/24 1551 04/14/24 2122 04/15/24 0456 04/16/24 0429 04/17/24 1318  NA 138  --  137  --  137 138 132*  K 6.5*   < > 5.9* 5.5* 4.6 3.6 4.3  CL 108  --  105  --  106 103 102  CO2 24  --  24  --  23 26 21*  GLUCOSE 83  --  86  --  97 103* 101*  BUN 27*  --  26*  --  24* 24* 23  CREATININE 1.40*  --  1.46*  --  1.58* 1.40* 1.46*  CALCIUM  9.2  --  9.3  --  9.5 9.6 7.8*   < > = values in this interval not displayed.   GFR: Estimated Creatinine Clearance: 37.6 mL/min (A) (by C-G formula based on SCr of 1.46 mg/dL (H)). Liver Function Tests: Recent Labs  Lab 04/13/24 1447 04/17/24 1318  AST 52* 29  ALT 82* 30  ALKPHOS 190* 138*  BILITOT 0.5 0.9  PROT 7.4 6.4*  ALBUMIN 3.6 2.7*   Recent Labs  Lab 04/13/24 1559 04/17/24 1318  LIPASE 61* 37   No results for input(s): AMMONIA in the last 168 hours. Coagulation Profile: Recent Labs  Lab 04/13/24 1549  INR 1.0   CBG: Recent Labs  Lab 04/13/24 1450 04/14/24 1120 04/14/24 2144 04/17/24 1300  GLUCAP 90 84 140* 117*   Thyroid  Function Tests: Recent Labs    04/16/24 0429  FREET4 1.07  T3FREE 2.3   Urine analysis:    Component Value Date/Time   COLORURINE YELLOW 04/13/2024  1639   APPEARANCEUR CLEAR 04/13/2024 1639   LABSPEC 1.009 04/13/2024 1639   PHURINE 5.0 04/13/2024 1639   GLUCOSEU NEGATIVE 04/13/2024 1639   HGBUR NEGATIVE 04/13/2024 1639   BILIRUBINUR NEGATIVE 04/13/2024 1639   KETONESUR NEGATIVE 04/13/2024 1639   PROTEINUR 30 (A) 04/13/2024 1639   UROBILINOGEN 1.0 04/22/2011 1555   NITRITE NEGATIVE 04/13/2024 1639   LEUKOCYTESUR NEGATIVE 04/13/2024 1639    Radiological Exams on Admission: CT Head Wo Contrast Result Date: 04/17/2024 CLINICAL DATA:  Altered mental status. EXAM: CT HEAD WITHOUT CONTRAST TECHNIQUE: Contiguous axial images were obtained from the base of the skull through the vertex without intravenous contrast. RADIATION DOSE REDUCTION: This exam was performed according to the departmental dose-optimization program which includes automated exposure control, adjustment of the mA and/or kV according to patient size and/or use of iterative reconstruction technique. COMPARISON:  January 13, 2020 FINDINGS: Brain: There is generalized cerebral atrophy with widening of the extra-axial spaces and ventricular dilatation. There are areas of decreased attenuation within the white matter tracts of the supratentorial brain, consistent with microvascular disease changes. A chronic anterolateral right temporal lobe infarct is suspected. Vascular: Moderate to marked severity bilateral cavernous carotid artery calcification is noted. Skull: Normal. Negative for fracture or focal lesion. Sinuses/Orbits: No acute finding. Other: None. IMPRESSION: 1. Generalized cerebral atrophy with chronic white matter small vessel ischemic changes. 2. Chronic anterolateral right temporal lobe infarct. Nonemergent MRI correlation is recommended. 3. No acute intracranial abnormality. Electronically Signed   By: Suzen Dials M.D.   On: 04/17/2024 14:43   DG Chest Portable 1 View Result Date: 04/17/2024 CLINICAL DATA:  syncope,  ams EXAM: PORTABLE CHEST - 1 VIEW COMPARISON:  April 13, 2024 FINDINGS: Low lung volumes with bronchovascular crowding. No focal airspace  consolidation, pleural effusion, or pneumothorax. No cardiomegaly. Aortic atherosclerosis. No acute fracture or destructive lesions. Multilevel thoracic osteophytosis. Bilateral AC joint osteoarthritis. IMPRESSION: Low lung volumes.  Otherwise, no acute cardiopulmonary abnormality. Electronically Signed   By: Rogelia Myers M.D.   On: 04/17/2024 13:42    EKG: Independently reviewed.  Sinus rhythm, rate 62, QTc 431.  Old LBBB.  No significant change from prior.  Assessment/Plan Principal Problem:   Acute encephalopathy Active Problems:   Acute diverticulitis   Dementia with behavioral disturbance (HCC)   Assessment and Plan: * Acute encephalopathy Currently lethargic.  Per nursing home, vomited this morning and subsequently became unresponsive.  Head CT-chronic ischemic changes, chronic anterior lateral right temporal lobe infarct, nonemergent MRI correlation recommended.  UA pending.  Chest x-ray clear.  Tmax 99.3,.  WBC 10.5 normal lactic acid 1.9..  Not hypoxic.  EKG unchanged.  Troponin 12 x 2.,  D-dimer 0.9 -unremarkable when corrected for age.  Question vasovagal syncope after episode of vomiting, need to rule out infectious etiology for lethargy.  No culprit meds on med list.  Lungs sound clear- doubt aspiration. -N.p.o. till improvement in mental status -Family med bolus given, continue D5 N/s 75cc/hr x 20hrs - Holding donepezil  and memantine  for now - Echocardiogram -Nonemergent MRI when able  Acute diverticulitis Recent hospitalization-treated with IV antibiotics and discharged on Augmentin. -Complete course of Augmentin  Dementia with behavioral disturbance (HCC) Term nursing home resident, non-ambulatory, advanced dementia. Seroquel  was discontinued during recent hospitalization.    DVT prophylaxis: Heparin Code Status: Full code Family Communication: Son- [name] and grandson at  bedside Disposition Plan: ~ 2 days Consults called: None Admission status: OBS, telemetry   Author: Tully FORBES Carwin, MD 04/17/2024 5:40 PM  For on call review [email or web address].

## 2024-04-18 ENCOUNTER — Observation Stay (HOSPITAL_COMMUNITY)

## 2024-04-18 ENCOUNTER — Observation Stay (HOSPITAL_BASED_OUTPATIENT_CLINIC_OR_DEPARTMENT_OTHER)

## 2024-04-18 ENCOUNTER — Observation Stay (HOSPITAL_COMMUNITY)
Admit: 2024-04-18 | Discharge: 2024-04-18 | Disposition: A | Discharge status: Home / Self Care | Attending: Internal Medicine | Admitting: Internal Medicine

## 2024-04-18 DIAGNOSIS — N1832 Chronic kidney disease, stage 3b: Secondary | ICD-10-CM | POA: Insufficient documentation

## 2024-04-18 DIAGNOSIS — F03918 Unspecified dementia, unspecified severity, with other behavioral disturbance: Secondary | ICD-10-CM | POA: Diagnosis not present

## 2024-04-18 DIAGNOSIS — R55 Syncope and collapse: Secondary | ICD-10-CM

## 2024-04-18 DIAGNOSIS — G9340 Encephalopathy, unspecified: Secondary | ICD-10-CM | POA: Diagnosis not present

## 2024-04-18 DIAGNOSIS — K5792 Diverticulitis of intestine, part unspecified, without perforation or abscess without bleeding: Secondary | ICD-10-CM | POA: Diagnosis not present

## 2024-04-18 DIAGNOSIS — E871 Hypo-osmolality and hyponatremia: Secondary | ICD-10-CM | POA: Insufficient documentation

## 2024-04-18 LAB — BASIC METABOLIC PANEL WITH GFR
Anion gap: 8 (ref 5–15)
BUN: 24 mg/dL — ABNORMAL HIGH (ref 8–23)
CO2: 25 mmol/L (ref 22–32)
Calcium: 8.9 mg/dL (ref 8.9–10.3)
Chloride: 103 mmol/L (ref 98–111)
Creatinine, Ser: 1.2 mg/dL — ABNORMAL HIGH (ref 0.44–1.00)
GFR, Estimated: 46 mL/min — ABNORMAL LOW (ref >60)
Glucose, Bld: 107 mg/dL — ABNORMAL HIGH (ref 70–99)
Potassium: 3.9 mmol/L (ref 3.5–5.1)
Sodium: 136 mmol/L (ref 135–145)

## 2024-04-18 LAB — CK: Total CK: 630 U/L — ABNORMAL HIGH (ref 38–234)

## 2024-04-18 LAB — CULTURE, BLOOD (ROUTINE X 2)
Culture: NO GROWTH
Culture: NO GROWTH

## 2024-04-18 LAB — ECHOCARDIOGRAM COMPLETE
Height: 66 in
S' Lateral: 3.1 cm
Weight: 3689.62 [oz_av]

## 2024-04-18 LAB — FOLATE: Folate: 5.2 ng/mL — ABNORMAL LOW (ref >5.9)

## 2024-04-18 LAB — VITAMIN B12: Vitamin B-12: 201 pg/mL (ref 180–914)

## 2024-04-18 LAB — TSH: TSH: 2.654 u[IU]/mL (ref 0.350–4.500)

## 2024-04-18 LAB — T4, FREE: Free T4: 1.21 ng/dL — ABNORMAL HIGH (ref 0.61–1.12)

## 2024-04-18 LAB — MAGNESIUM: Magnesium: 1.9 mg/dL (ref 1.7–2.4)

## 2024-04-18 MED ORDER — VITAMIN B-12 100 MCG PO TABS
500.0000 ug | ORAL_TABLET | Freq: Every day | ORAL | Status: DC
  Administered 2024-04-19 – 2024-04-20 (×2): 500 ug via ORAL
  Filled 2024-04-18 (×2): qty 5

## 2024-04-18 MED ORDER — IOHEXOL 350 MG/ML SOLN
60.0000 mL | Freq: Once | INTRAVENOUS | Status: AC | PRN
  Administered 2024-04-18: 60 mL via INTRAVENOUS

## 2024-04-18 MED ORDER — DOXYCYCLINE HYCLATE 100 MG PO TABS
100.0000 mg | ORAL_TABLET | Freq: Two times a day (BID) | ORAL | Status: DC
  Administered 2024-04-18 – 2024-04-20 (×4): 100 mg via ORAL
  Filled 2024-04-18 (×4): qty 1

## 2024-04-18 MED ORDER — FOLIC ACID 5 MG/ML IJ SOLN
1.0000 mg | Freq: Once | INTRAMUSCULAR | Status: AC
  Administered 2024-04-18: 1 mg via INTRAVENOUS
  Filled 2024-04-18: qty 0.2

## 2024-04-18 MED ORDER — FOLIC ACID 1 MG PO TABS
1.0000 mg | ORAL_TABLET | Freq: Every day | ORAL | Status: DC
  Administered 2024-04-19 – 2024-04-20 (×2): 1 mg via ORAL
  Filled 2024-04-18 (×2): qty 1

## 2024-04-18 MED ORDER — SODIUM CHLORIDE 0.9 % IV SOLN: 1.0000 mg | Freq: Once | INTRAVENOUS | Status: DC

## 2024-04-18 MED ORDER — CYANOCOBALAMIN 1000 MCG/ML IJ SOLN
1000.0000 ug | Freq: Once | INTRAMUSCULAR | Status: AC
  Administered 2024-04-18: 1000 ug via INTRAMUSCULAR
  Filled 2024-04-18: qty 1

## 2024-04-18 NOTE — Progress Notes (Signed)
Routine EEG complete. Results pending.

## 2024-04-18 NOTE — Plan of Care (Signed)

## 2024-04-18 NOTE — Care Management Obs Status (Signed)
 MEDICARE OBSERVATION STATUS NOTIFICATION   Patient Details  Name: Doris Riley MRN: [id number] Date of Birth: 10-Jul-1941   Medicare Observation Status Notification Given:  Yes  Copy given for family.  Sharlyne Stabs, RN 04/18/2024, 1:17 PM

## 2024-04-18 NOTE — Progress Notes (Signed)
 PROGRESS NOTE  Doris Riley FMW:[id number] DOB: [birth year] DOA: 04/17/2024 PCP: System, Provider Not In  Brief History:  83 year old female with a history of dementia, hypertension, depression presenting with altered mental status.  The patient was recently hospitalized 04/13/2024 to 04/16/2024 the patient presented with hypothermia and lethargy.  A.m. cortisol was 14.0.  TSH was 6.4 with normal T3 and T4.  UA was negative for pyuria.  CT AP on 04/13/2024 showed diffuse left colonic diverticulosis with slight haziness adjacent to the distal descending colon and in the sigmoid colon concerning for early acute diverticulitis.  The patient was treated with IV antibiotics and transition to oral Augmentin  at time of discharge.  At the time of discharge, her Lotrel, Lexapro , discontinued. She was brought back to the emergency department because the patient had an episode of emesis while eating breakfast and subsequently became unresponsive.  Per EMS patient responsive to pain.  On arrival to the ED, patient was lethargic and quickly falling asleep.  Unfortunately, the patient was unable to provide us  in the history secondary to her dementia.  In the ED, the temperature 99.4 Fahrenheit.  She was hemodynamically stable with oxygen saturation 99% on room air.  WBC 10.5, hemoglobin 10.3, platelets 195.  LFTs unremarkable.  Sodium 132, potassium 4.3, 21, serum creatinine 1.46.  EKG shows sinus rhythm left bundle branch block.  Troponin 12>> 12.  Lactic acid 1.9.  UA was negative for pyuria.  CT brain was negative for acute findings.   Assessment/Plan: Acute Metabolic Encephalopathy/Transient Loss of Consciousness -at baseline pt is conversant, pleasantly confused -presented with lethargy only responsive to pain -UA no pyuria -B12 -folate -TSH -MR brain -EEG -suspect volume depletion/hypotension/vagal phenomena -check orthostatics -Echo  Elevated D-dimer -only minimally elevated for age, but  pt complains of ?dyspnea -CTA chest  Acute Diverticulitis -04/13/24 CT AP as discussed above -no abd pain on exam 6/27 -continue amox/clav  Major Neurocognitive Disorder -pt has hx of sundowning  CKD 3b -baseline creatinine 1.4-1.5  Essential Hypertension -holding amlodipine  due to soft BPs  Hyponatremia -due to volume depletion -continue IVF       Family Communication:   left VM for son  Consultants:  none  Code Status:  FULL   DVT Prophylaxis:  Holly Heparin     Procedures: As Listed in Progress Note Above  Antibiotics: Amox/clav      Subjective: Pt is pleasantly confused.  ?endorses sob.  Denies cp, abd pain.  Remainder ROS not possible due to encephalopathy  Objective: Vitals:   04/17/24 1710 04/17/24 2124 04/18/24 0053 04/18/24 0510  BP: (!) 132/52 (!) 105/35 (!) 131/45 (!) 120/41  Pulse: 73 (!) 56 66 68  Resp: 20 14 16 19   Temp: 98.4 F (36.9 C) 99.4 F (37.4 C) 99.1 F (37.3 C) 99.1 F (37.3 C)  TempSrc: Oral   Oral  SpO2: 100% 98% 99% 95%  Weight: 104.6 kg     Height:        Intake/Output Summary (Last 24 hours) at 04/18/2024 0738 Last data filed at 04/18/2024 9687 Gross per 24 hour  Intake 500.78 ml  Output 650 ml  Net -149.22 ml   Weight change:  Exam:  General:  Pt is alert, follows commands appropriately, not in acute distress HEENT: No icterus, No thrush, No neck mass, /AT Cardiovascular: RRR, S1/S2, no rubs, no gallops Respiratory: fine bibasilar crackles.  No wheeze Abdomen: Soft/+BS, non tender, non distended, no guarding Extremities:  trece LE edema, No lymphangitis, No petechiae, No rashes, no synovitis   Data Reviewed: I have personally reviewed following labs and imaging studies Basic Metabolic Panel: Recent Labs  Lab 04/14/24 0401 04/14/24 0641 04/14/24 1551 04/14/24 2122 04/15/24 0456 04/16/24 0429 04/17/24 1318  NA 138  --  137  --  137 138 132*  K 6.5*   < > 5.9* 5.5* 4.6 3.6 4.3  CL 108  --  105  --   106 103 102  CO2 24  --  24  --  23 26 21*  GLUCOSE 83  --  86  --  97 103* 101*  BUN 27*  --  26*  --  24* 24* 23  CREATININE 1.40*  --  1.46*  --  1.58* 1.40* 1.46*  CALCIUM  9.2  --  9.3  --  9.5 9.6 7.8*   < > = values in this interval not displayed.   Liver Function Tests: Recent Labs  Lab 04/13/24 1447 04/17/24 1318  AST 52* 29  ALT 82* 30  ALKPHOS 190* 138*  BILITOT 0.5 0.9  PROT 7.4 6.4*  ALBUMIN 3.6 2.7*   Recent Labs  Lab 04/13/24 1559 04/17/24 1318  LIPASE 61* 37   No results for input(s): AMMONIA in the last 168 hours. Coagulation Profile: Recent Labs  Lab 04/13/24 1549  INR 1.0   CBC: Recent Labs  Lab 04/13/24 1447 04/14/24 0304 04/15/24 0456 04/17/24 1318  WBC 5.2 4.6 5.5 10.5  NEUTROABS 3.8  --   --   --   HGB 11.4* 10.5* 10.8* 10.3*  HCT 35.3* 33.1* 35.8* 32.8*  MCV 86.9 88.3 88.8 86.3  PLT 230 218 217 195   Cardiac Enzymes: No results for input(s): CKTOTAL, CKMB, CKMBINDEX, TROPONINI in the last 168 hours. BNP: Invalid input(s): POCBNP CBG: Recent Labs  Lab 04/13/24 1450 04/14/24 1120 04/14/24 2144 04/17/24 1300  GLUCAP 90 84 140* 117*   HbA1C: No results for input(s): HGBA1C in the last 72 hours. Urine analysis:    Component Value Date/Time   COLORURINE YELLOW 04/17/2024 1824   APPEARANCEUR CLEAR 04/17/2024 1824   LABSPEC 1.015 04/17/2024 1824   PHURINE 5.0 04/17/2024 1824   GLUCOSEU 50 (A) 04/17/2024 1824   HGBUR MODERATE (A) 04/17/2024 1824   BILIRUBINUR NEGATIVE 04/17/2024 1824   KETONESUR NEGATIVE 04/17/2024 1824   PROTEINUR 100 (A) 04/17/2024 1824   UROBILINOGEN 1.0 04/22/2011 1555   NITRITE NEGATIVE 04/17/2024 1824   LEUKOCYTESUR NEGATIVE 04/17/2024 1824   Sepsis Labs: @LABRCNTIP (procalcitonin:4,lacticidven:4) ) Recent Results (from the past 240 hours)  Blood Culture (routine x 2)     Status: None   Collection Time: 04/13/24  2:47 PM   Specimen: BLOOD  Result Value Ref Range Status   Specimen  Description BLOOD BLOOD LEFT HAND  Final   Special Requests   Final    BOTTLES DRAWN AEROBIC AND ANAEROBIC Blood Culture results may not be optimal due to an inadequate volume of blood received in culture bottles   Culture   Final    NO GROWTH 5 DAYS Performed at [facility], [address] [place], [place] [address]    Report Status 04/18/2024 FINAL  Final  Blood Culture (routine x 2)     Status: None   Collection Time: 04/13/24  2:47 PM   Specimen: BLOOD  Result Value Ref Range Status   Specimen Description BLOOD BLOOD RIGHT ARM  Final   Special Requests   Final    BOTTLES DRAWN AEROBIC  AND ANAEROBIC Blood Culture results may not be optimal due to an inadequate volume of blood received in culture bottles   Culture   Final    NO GROWTH 5 DAYS Performed at [facility], [address] [place], [place] [address]    Report Status 04/18/2024 FINAL  Final  MRSA Next Gen by PCR, Nasal     Status: None   Collection Time: 04/13/24  5:40 PM   Specimen: Nasal Mucosa; Nasal Swab  Result Value Ref Range Status   MRSA by PCR Next Gen NOT DETECTED NOT DETECTED Final    Comment: (NOTE) The GeneXpert MRSA Assay (FDA approved for NASAL specimens only), is one component of a comprehensive MRSA colonization surveillance program. It is not intended to diagnose MRSA infection nor to guide or monitor treatment for MRSA infections. Test performance is not FDA approved in patients less than 82 years old. Performed at [facility], [address], [place], [place] [address]      Scheduled Meds:  amoxicillin -clavulanate  1 tablet Oral Q12H   Chlorhexidine  Gluconate Cloth  6 each Topical Daily   heparin   5,000 Units Subcutaneous Q8H   senna-docusate  1 tablet Oral BID   Continuous Infusions:  dextrose  5 % and 0.9 % NaCl 75 mL/hr at 04/17/24 8177    Procedures/Studies: CT Head Wo Contrast Result Date: 04/17/2024 CLINICAL DATA:  Altered mental status. EXAM: CT HEAD WITHOUT CONTRAST TECHNIQUE:  Contiguous axial images were obtained from the base of the skull through the vertex without intravenous contrast. RADIATION DOSE REDUCTION: This exam was performed according to the departmental dose-optimization program which includes automated exposure control, adjustment of the mA and/or kV according to patient size and/or use of iterative reconstruction technique. COMPARISON:  January 13, 2020 FINDINGS: Brain: There is generalized cerebral atrophy with widening of the extra-axial spaces and ventricular dilatation. There are areas of decreased attenuation within the white matter tracts of the supratentorial brain, consistent with microvascular disease changes. A chronic anterolateral right temporal lobe infarct is suspected. Vascular: Moderate to marked severity bilateral cavernous carotid artery calcification is noted. Skull: Normal. Negative for fracture or focal lesion. Sinuses/Orbits: No acute finding. Other: None. IMPRESSION: 1. Generalized cerebral atrophy with chronic white matter small vessel ischemic changes. 2. Chronic anterolateral right temporal lobe infarct. Nonemergent MRI correlation is recommended. 3. No acute intracranial abnormality. Electronically Signed   By: Suzen Dials M.D.   On: 04/17/2024 14:43   DG Chest Portable 1 View Result Date: 04/17/2024 CLINICAL DATA:  syncope,  ams EXAM: PORTABLE CHEST - 1 VIEW COMPARISON:  April 13, 2024 FINDINGS: Low lung volumes with bronchovascular crowding. No focal airspace consolidation, pleural effusion, or pneumothorax. No cardiomegaly. Aortic atherosclerosis. No acute fracture or destructive lesions. Multilevel thoracic osteophytosis. Bilateral AC joint osteoarthritis. IMPRESSION: Low lung volumes.  Otherwise, no acute cardiopulmonary abnormality. Electronically Signed   By: Rogelia Myers M.D.   On: 04/17/2024 13:42   CT ABDOMEN PELVIS WO CONTRAST Result Date: 04/13/2024 CLINICAL DATA:  General abdominal pain EXAM: CT ABDOMEN AND PELVIS WITHOUT  CONTRAST TECHNIQUE: Multidetector CT imaging of the abdomen and pelvis was performed following the standard protocol without IV contrast. RADIATION DOSE REDUCTION: This exam was performed according to the departmental dose-optimization program which includes automated exposure control, adjustment of the mA and/or kV according to patient size and/or use of iterative reconstruction technique. COMPARISON:  None available FINDINGS: Lower chest: No acute abnormality Hepatobiliary: No focal hepatic abnormality. Gallbladder unremarkable. Pancreas: No focal abnormality or ductal dilatation.  Spleen: No focal abnormality.  Normal size. Adrenals/Urinary Tract: No adrenal abnormality. No focal renal abnormality. No stones or hydronephrosis. Foley catheter in the bladder with bladder decompressed. Stomach/Bowel: Left colonic diverticulosis. Slight haziness noted around the mid descending colon as well as the mid sigmoid colon concerning for early diverticulitis. Stomach and small bowel decompressed. Vascular/Lymphatic: Aortic atherosclerosis. No evidence of aneurysm or adenopathy. Reproductive: Prior hysterectomy.  No adnexal masses. Other: No free fluid or free air. Musculoskeletal: No acute bony abnormality IMPRESSION: Diffuse left colonic diverticulosis. Slight haziness noted adjacent to the distal descending colon and mid sigmoid colon concerning for early active diverticulitis. No complicating feature. Aortic atherosclerosis. Electronically Signed   By: Franky Crease M.D.   On: 04/13/2024 16:35   DG Chest Port 1 View Result Date: 04/13/2024 CLINICAL DATA:  Questionable sepsis EXAM: PORTABLE CHEST 1 VIEW COMPARISON:  05/22/2011 FINDINGS: Normal cardiac silhouette. Ectatic aorta. Low lung volumes. Chronic bronchitic markings. No focal consolidation. No pneumothorax No acute osseous abnormality. IMPRESSION: Low lung volumes and chronic bronchitic markings. No acute findings. Electronically Signed   By: Jackquline Boxer M.D.    On: 04/13/2024 14:36    Alm Schneider, DO  [facility]  If 7PM-7AM, please contact night-coverage [email or web address] Password [facility] 04/18/2024, 7:38 AM   LOS: 0 days

## 2024-04-18 NOTE — TOC Initial Note (Signed)
 Transition of Care [facility]) - Initial/Assessment Note    Patient Details  Name: Doris Riley MRN: [id number] Date of Birth: 02-10-1941  Transition of [facility]) CM/SW Contact:    Sharlyne Stabs, RN Phone Number: 04/18/2024, 9:52 AM  Clinical Narrative:               Patient admitted with acute encephalopathy. Patient is LTC at [facility]. Updated Facility with discharge plan for Saturday.    Expected Discharge Plan: Long Term Nursing Home Barriers to Discharge: Continued Medical Work up   Patient Goals and CMS Choice Patient states their goals for this hospitalization and ongoing recovery are:: return to [email or web address].gov Compare Post Acute Care list provided to:: Patient Represenative (must comment) Choice offered to / list presented to :  (Cousin)     Expected Discharge Plan and Services     Post Acute Care Choice: Skilled Nursing Facility, Durable Medical Equipment Living arrangements for the past 2 months: Skilled Nursing Facility                   Prior Living Arrangements/Services Living arrangements for the past 2 months: Skilled Nursing Facility Lives with:: Facility Resident     Current home services: DME    Activities of Daily Living   ADL Screening (condition at time of admission) Independently performs ADLs?: No Does the patient have a NEW difficulty with bathing/dressing/toileting/self-feeding that is expected to last >3 days?: No Does the patient have a NEW difficulty with getting in/out of bed, walking, or climbing stairs that is expected to last >3 days?: No Does the patient have a NEW difficulty with communication that is expected to last >3 days?: No Is the patient deaf or have difficulty hearing?: No Does the patient have difficulty seeing, even when wearing glasses/contacts?: No Does the patient have difficulty concentrating, remembering, or making decisions?: Yes   Orientation: : Oriented to Self Alcohol / Substance Use: Not  Applicable Psych Involvement: No (comment)  Admission diagnosis:  Somnolence [R40.0] Acute encephalopathy [G93.40] Patient Active Problem List   Diagnosis Date Noted   Chronic kidney disease, stage 3b (HCC) 04/18/2024   Hyponatremia 04/18/2024   Acute encephalopathy 04/17/2024   Hypothermia 04/13/2024   Acute diverticulitis 04/13/2024   Vitamin B 12 deficiency 01/06/2020   Elevated glucose 12/11/2019   Need for vaccination for Strep pneumoniae 12/11/2019   Screening examination for infectious disease 12/11/2019   Dementia with behavioral disturbance (HCC) 12/01/2019   Chronic pain of right knee 12/01/2019   Essential hypertension 12/01/2019   Hyperlipidemia 12/01/2019   Arthritis of knee, degenerative 04/28/2015   S/P total knee replacement, left 04/28/15    Tibial plateau fracture    Bloating 10/26/2011   Constipation 08/07/2011   Family history of colon cancer 08/07/2011   Alkaline phosphatase raised 08/07/2011   Anemia 08/07/2011   PCP:  System, Provider Not In Pharmacy:   [id number] - [place], [place] - [address] AT SEC OF [address] & [address] [place] [address] Phone: [phone] Fax: [phone]  [facility] - [place], [place] - [address] [place] [address] Phone: [phone] Fax: [phone]     Social Drivers of Health (SDOH) Social History: SDOH Screenings   Food Insecurity: No Food Insecurity (04/17/2024)  Housing: Low Risk  (04/17/2024)  Transportation Needs: No Transportation Needs (04/17/2024)  Utilities: Not At Risk (04/17/2024)  Recent Concern: Utilities - At  Risk (04/13/2024)  Depression (PHQ2-9): Medium Risk (12/09/2019)  Social Connections: Patient Unable To Answer (04/17/2024)  Tobacco Use: Low Risk  (04/17/2024)   SDOH Interventions:   Readmission Risk Interventions     No data to display

## 2024-04-18 NOTE — Hospital Course (Addendum)
 83 year old female with a history of dementia, hypertension, depression presenting with altered mental status.  The patient was recently hospitalized 04/13/2024 to 04/16/2024 the patient presented with hypothermia and lethargy.  A.m. cortisol was 14.0.  TSH was 6.4 with normal T3 and T4.  UA was negative for pyuria.  CT AP on 04/13/2024 showed diffuse left colonic diverticulosis with slight haziness adjacent to the distal descending colon and in the sigmoid colon concerning for early acute diverticulitis.  The patient was treated with IV antibiotics and transition to oral Augmentin  at time of discharge.  At the time of discharge, her Lotrel, Lexapro , discontinued. She was brought back to the emergency department because the patient had an episode of emesis while eating breakfast and subsequently became unresponsive.  Per EMS patient responsive to pain.  On arrival to the ED, patient was lethargic and quickly falling asleep.  Unfortunately, the patient was unable to provide us  in the history secondary to her dementia.  In the ED, the temperature 99.4 Fahrenheit.  She was hemodynamically stable with oxygen saturation 99% on room air.  WBC 10.5, hemoglobin 10.3, platelets 195.  LFTs unremarkable.  Sodium 132, potassium 4.3, 21, serum creatinine 1.46.  EKG shows sinus rhythm left bundle branch block.  Troponin 12>> 12.  Lactic acid 1.9.  UA was negative for pyuria.  CT brain was negative for acute findings. Work up revealed low B12 and folic acid  which were repleted.  Her mental status improved and returned to baseline.  She was started on IV abx for aspiration pneumonitis.  She will d/c with 6 more days oral abx.  MR brain was negative for acute findings.  CTA chest was negative for large/central PE.   Return to SNF was delayed due to logistics involving transport.  Patient remained clinically stable for d/c 04/20/24.

## 2024-04-18 NOTE — Plan of Care (Signed)
  Problem: Clinical Measurements: Goal: Will remain free from infection Outcome: Progressing   Problem: Activity: Goal: Risk for activity intolerance will decrease Outcome: Progressing   Problem: Nutrition: Goal: Adequate nutrition will be maintained Outcome: Progressing   Problem: Safety: Goal: Ability to remain free from injury will improve Outcome: Progressing   

## 2024-04-19 DIAGNOSIS — R569 Unspecified convulsions: Secondary | ICD-10-CM | POA: Diagnosis not present

## 2024-04-19 DIAGNOSIS — R55 Syncope and collapse: Secondary | ICD-10-CM | POA: Diagnosis not present

## 2024-04-19 DIAGNOSIS — N1832 Chronic kidney disease, stage 3b: Secondary | ICD-10-CM | POA: Diagnosis not present

## 2024-04-19 DIAGNOSIS — E871 Hypo-osmolality and hyponatremia: Secondary | ICD-10-CM

## 2024-04-19 DIAGNOSIS — G9340 Encephalopathy, unspecified: Secondary | ICD-10-CM | POA: Diagnosis not present

## 2024-04-19 DIAGNOSIS — I10 Essential (primary) hypertension: Secondary | ICD-10-CM | POA: Diagnosis not present

## 2024-04-19 DIAGNOSIS — J690 Pneumonitis due to inhalation of food and vomit: Secondary | ICD-10-CM

## 2024-04-19 LAB — BASIC METABOLIC PANEL WITH GFR
Anion gap: 9 (ref 5–15)
BUN: 21 mg/dL (ref 8–23)
CO2: 24 mmol/L (ref 22–32)
Calcium: 9 mg/dL (ref 8.9–10.3)
Chloride: 103 mmol/L (ref 98–111)
Creatinine, Ser: 1.1 mg/dL — ABNORMAL HIGH (ref 0.44–1.00)
GFR, Estimated: 51 mL/min — ABNORMAL LOW (ref >60)
Glucose, Bld: 96 mg/dL (ref 70–99)
Potassium: 4 mmol/L (ref 3.5–5.1)
Sodium: 136 mmol/L (ref 135–145)

## 2024-04-19 LAB — CK: Total CK: 359 U/L — ABNORMAL HIGH (ref 38–234)

## 2024-04-19 MED ORDER — DOXYCYCLINE HYCLATE 100 MG PO TABS
100.0000 mg | ORAL_TABLET | Freq: Two times a day (BID) | ORAL | Status: AC
Start: 2024-04-19 — End: ?

## 2024-04-19 MED ORDER — AMOXICILLIN-POT CLAVULANATE 875-125 MG PO TABS: 1.0000 | ORAL_TABLET | Freq: Two times a day (BID) | ORAL | Status: AC

## 2024-04-19 MED ORDER — FOLIC ACID 1 MG PO TABS
1.0000 mg | ORAL_TABLET | Freq: Every day | ORAL | Status: AC
Start: 2024-04-20 — End: ?

## 2024-04-19 MED ORDER — CYANOCOBALAMIN 500 MCG PO TABS: 500.0000 ug | ORAL_TABLET | Freq: Every day | ORAL | Status: AC

## 2024-04-19 NOTE — Discharge Summary (Signed)
 Physician Discharge Summary   Patient: Doris Riley MRN: [id number] DOB: [birth year]  Admit date:     04/17/2024  Discharge date: 04/19/24  Discharge Physician: Alm Ariellah Faust   PCP: System, Provider Not In   Recommendations at discharge:   Please follow up with primary care provider within 1-2 weeks  Please repeat BMP and CBC in one week    Hospital Course: 83 year old female with a history of dementia, hypertension, depression presenting with altered mental status.  The patient was recently hospitalized 04/13/2024 to 04/16/2024 the patient presented with hypothermia and lethargy.  A.m. cortisol was 14.0.  TSH was 6.4 with normal T3 and T4.  UA was negative for pyuria.  CT AP on 04/13/2024 showed diffuse left colonic diverticulosis with slight haziness adjacent to the distal descending colon and in the sigmoid colon concerning for early acute diverticulitis.  The patient was treated with IV antibiotics and transition to oral Augmentin  at time of discharge.  At the time of discharge, her Lotrel, Lexapro , discontinued. She was brought back to the emergency department because the patient had an episode of emesis while eating breakfast and subsequently became unresponsive.  Per EMS patient responsive to pain.  On arrival to the ED, patient was lethargic and quickly falling asleep.  Unfortunately, the patient was unable to provide us  in the history secondary to her dementia.  In the ED, the temperature 99.4 Fahrenheit.  She was hemodynamically stable with oxygen saturation 99% on room air.  WBC 10.5, hemoglobin 10.3, platelets 195.  LFTs unremarkable.  Sodium 132, potassium 4.3, 21, serum creatinine 1.46.  EKG shows sinus rhythm left bundle branch block.  Troponin 12>> 12.  Lactic acid 1.9.  UA was negative for pyuria.  CT brain was negative for acute findings.  Assessment and Plan: Acute Metabolic Encephalopathy/Transient Loss of Consciousness -at baseline pt is conversant, pleasantly  confused -presented with lethargy only responsive to pain -UA no pyuria -B12--201>>replete--given IM x 1 and start po -folate--5.2>>replete -TSH--2.654 -MR brain--no acute abnormality -EEG--neg seizure -suspect volume depletion/hypotension/vagal phenomena -check orthostatics--pt is uncooperative -Echo--EF 55-60, no WMA, limited due to poor cooperation -6/28--mentation back to baseline--pt is pleasantly confused, A&O x 1   Elevated D-dimer -only minimally elevated for age, but pt complains of ?dyspnea -CTA chest--no lobar or central PE,  Peribronchovascular right upper lobe ground-glass, favoring mild Infection  Aspiration pneumonitis -continue amox/clav x 6 more days -add doxycycline x 6 more days   Acute Diverticulitis -04/13/24 CT AP as discussed above -no abd pain on exam 6/27 -continue amox/clav   Major Neurocognitive Disorder -pt has hx of sundowning   CKD 3b -baseline creatinine 1.2-1.5   Essential Hypertension -holding amlodipine  due to soft BPs -BPs remained controllled -will not restart amlodipine    Hyponatremia -due to volume depletion -continue IVF>>improved         Consultants: none Procedures performed: none  Disposition: Skilled nursing facility Diet recommendation:  Regular diet DISCHARGE MEDICATION: Allergies as of 04/19/2024       Reactions   Tape Other (See Comments)   Skin sensitivity        Medication List     STOP taking these medications    amLODipine  10 MG tablet Commonly known as: NORVASC        TAKE these medications    acetaminophen  650 MG CR tablet Commonly known as: TYLENOL  Take 650 mg by mouth 3 (three) times daily.   amoxicillin -clavulanate 875-125 MG tablet Commonly known as: AUGMENTIN  Take 1 tablet by mouth every 12 (twelve)  hours for 8 days. X 6 days What changed: additional instructions   atorvastatin  10 MG tablet Commonly known as: LIPITOR Take 1 tablet (10 mg total) by mouth daily.    cyanocobalamin  500 MCG tablet Commonly known as: VITAMIN B12 Take 1 tablet (500 mcg total) by mouth daily. Start taking on: April 20, 2024   donepezil  10 MG tablet Commonly known as: ARICEPT  Take 1 tablet (10 mg total) by mouth at bedtime. What changed: additional instructions   donepezil  5 MG tablet Commonly known as: ARICEPT  Take 5 mg by mouth at bedtime. Take with 10 mg for total of 15 mg What changed: Another medication with the same name was changed. Make sure you understand how and when to take each.   doxycycline 100 MG tablet Commonly known as: VIBRA-TABS Take 1 tablet (100 mg total) by mouth every 12 (twelve) hours. X 6 days   folic acid  1 MG tablet Commonly known as: FOLVITE  Take 1 tablet (1 mg total) by mouth daily. Start taking on: April 20, 2024   furosemide  20 MG tablet Commonly known as: LASIX  Take 10 mg by mouth daily.   memantine  10 MG tablet Commonly known as: NAMENDA  Take 1 tablet (10 mg total) by mouth 2 (two) times daily.   ondansetron  4 MG disintegrating tablet Commonly known as: ZOFRAN -ODT Take 4 mg by mouth every 8 (eight) hours as needed for nausea or vomiting.   senna-docusate 8.6-50 MG tablet Commonly known as: Senokot-S Take 1 tablet by mouth 2 (two) times daily.        Discharge Exam: Filed Weights   04/17/24 1253 04/17/24 1710  Weight: 105 kg 104.6 kg   HEENT:  [place]/AT, No thrush, no icterus CV:  RRR, no rub, no S3, no S4 Lung:  CTA, no wheeze, no rhonchi Abd:  soft/+BS, NT Ext:  trace LE edema, no lymphangitis, no synovitis, no rash   Condition at discharge: stable  The results of significant diagnostics from this hospitalization (including imaging, microbiology, ancillary and laboratory) are listed below for reference.   Imaging Studies: EEG adult Result Date: 04/19/2024 Doris Arlin KIDD, MD     04/19/2024  5:21 AM Patient Name: Doris Riley MRN: [id number] Epilepsy Attending: Arlin Riley Doris Referring Physician/Provider:  Evonnie Lenis, MD Date: 04/18/2024 Duration: 22.57 mins Patient history: 83yo M with ams. EEG to evaluate for seizure Level of alertness: Awake AEDs during EEG study: None Technical aspects: This EEG study was done with scalp electrodes positioned according to the 10-20 International system of electrode placement. Electrical activity was reviewed with band pass filter of 1-70Hz , sensitivity of 7 uV/mm, display speed of 107mm/sec with a 60Hz  notched filter applied as appropriate. EEG data were recorded continuously and digitally stored.  Video monitoring was available and reviewed as appropriate. Description: EEG showed continuous generalized 3 to 5 Hz theta-delta slowing. Hyperventilation and photic stimulation were not performed.   ABNORMALITY - Continuous slow, generalized IMPRESSION: This study is suggestive of moderate diffuse encephalopathy. No seizures or epileptiform discharges were seen throughout the recording. Arlin Riley Doris   ECHOCARDIOGRAM COMPLETE Result Date: 04/18/2024    ECHOCARDIOGRAM REPORT   Patient Name:   Doris Riley Date of Exam: 04/18/2024 Medical Rec #:  [id number]       Height:       66.0 in Accession #:    [id number]      Weight:       230.6 lb Date of Birth:  1941/06/27  BSA:          2.125 m Patient Age:    82 years        BP:           120/41 mmHg Patient Gender: F               HR:           75 bpm. Exam Location:  [facility] Procedure: 2D Echo (Both Spectral and Color Flow Doppler were utilized during            procedure). Indications:    Syncope  History:        Patient has no prior history of Echocardiogram examinations.                 Risk Factors:Hypertension.  Sonographer:    Jayson Gaskins Referring Phys: [id number] EJIROGHENE E [facility]  Sonographer Comments: Image acquisition challenging due to uncooperative patient. IMPRESSIONS  1. Left ventricular ejection fraction, by estimation, is 55 to 60%. The left ventricle has normal function. The left ventricle has no regional wall  motion abnormalities. There is mild left ventricular hypertrophy.  2. Right ventricular systolic function was not well visualized. The right ventricular size is not well visualized.  3. The mitral valve was not well visualized.  4. The aortic valve was not well visualized.  5. Limited study, patient refused to complete study. FINDINGS  Left Ventricle: Left ventricular ejection fraction, by estimation, is 55 to 60%. The left ventricle has normal function. The left ventricle has no regional wall motion abnormalities. The left ventricular internal cavity size was normal in size. There is  mild left ventricular hypertrophy. Right Ventricle: The right ventricular size is not well visualized. Right vetricular wall thickness was not well visualized. Right ventricular systolic function was not well visualized. Left Atrium: Left atrial size was not well visualized. Right Atrium: Right atrial size was not well visualized. Pericardium: The pericardium was not well visualized. Mitral Valve: The mitral valve was not well visualized. Tricuspid Valve: The tricuspid valve is not well visualized. Aortic Valve: The aortic valve was not well visualized. Pulmonic Valve: The pulmonic valve was not well visualized. Pulmonic valve regurgitation is not visualized. No evidence of pulmonic stenosis. Aorta: The aortic root is normal in size and structure. IAS/Shunts: The interatrial septum was not well visualized.  LEFT VENTRICLE PLAX 2D LVIDd:         4.50 cm LVIDs:         3.10 cm LV PW:         1.10 cm LV IVS:        1.10 cm LVOT diam:     1.80 cm LVOT Area:     2.54 cm   AORTA Ao Root diam: 2.30 cm  SHUNTS Systemic Diam: 1.80 cm Dorn Ross MD Electronically signed by Dorn Ross MD Signature Date/Time: 04/18/2024/3:10:21 PM    Final    MR BRAIN WO CONTRAST Result Date: 04/18/2024 CLINICAL DATA:  Mental status change, history of dementia. EXAM: MRI HEAD WITHOUT CONTRAST TECHNIQUE: Multiplanar, multiecho pulse sequences of the  brain and surrounding structures were obtained without intravenous contrast. COMPARISON:  CT head 04/17/2024. FINDINGS: Brain: No acute infarct. No evidence of intracranial hemorrhage. Scattered T2/FLAIR hyperintensity in the periventricular and subcortical white matter. Mild-to-moderate generalized parenchymal volume loss without lobar specific atrophy. Remote infarct in the anterolateral right temporal lobe. No edema, mass effect, or midline shift. Posterior fossa is unremarkable. Normal appearance of midline structures. The  basilar cisterns are patent. No extra-axial fluid collections. Ventricles: Prominence of the lateral ventricles suggestive of underlying parenchymal volume loss. Vascular: Skull base flow voids are visualized. Skull and upper cervical spine: No focal abnormality. Sinuses/Orbits: Orbits are symmetric. Paranasal sinuses are clear. Other: Mastoid air cells are clear. IMPRESSION: No acute intracranial abnormality. Mild chronic microvascular ischemic changes. Mild to moderate parenchymal volume loss without lobar specific atrophy. Remote infarct in the anterolateral right temporal lobe. Electronically Signed   By: Donnice Mania M.D.   On: 04/18/2024 14:59   CT Angio Chest Pulmonary Embolism (PE) W or WO Contrast Result Date: 04/18/2024 CLINICAL DATA:  Dyspnea.  Elevated D-dimer. EXAM: CT ANGIOGRAPHY CHEST WITH CONTRAST TECHNIQUE: Multidetector CT imaging of the chest was performed using the standard protocol during bolus administration of intravenous contrast. Multiplanar CT image reconstructions and MIPs were obtained to evaluate the vascular anatomy. RADIATION DOSE REDUCTION: This exam was performed according to the departmental dose-optimization program which includes automated exposure control, adjustment of the mA and/or kV according to patient size and/or use of iterative reconstruction technique. CONTRAST:  60mL OMNIPAQUE IOHEXOL 350 MG/ML SOLN COMPARISON:  Plain film yesterday.  No prior  CT. FINDINGS: Cardiovascular: The quality of this exam for evaluation of pulmonary embolism is poor. The bolus is poorly timed, centered in the aorta. In addition, there are limitations secondary to patient body habitus, mild motion, and arm position (not raised above the head). No central or large lobar pulmonary embolism. Aortic atherosclerosis. Mild cardiomegaly, without pericardial effusion. Lad and right coronary artery calcification. Mediastinum/Nodes: No mediastinal or hilar adenopathy. Tiny hiatal hernia. Lungs/Pleura: No pleural fluid. Heterogeneous right upper lobe peribronchovascular ground-glass. Upper Abdomen: Normal imaged portions of the liver, spleen, pancreas, left adrenal gland, left kidney. Scattered colonic diverticula. Musculoskeletal: No acute osseous abnormality. Review of the MIP images confirms the above findings. IMPRESSION: 1. Poor quality evaluation for pulmonary embolism as detailed above. No central or large lobar embolism identified. 2. Peribronchovascular right upper lobe ground-glass, favoring mild infection. 3. Incidental findings, including: Coronary artery atherosclerosis. Aortic Atherosclerosis (ICD10-I70.0). Tiny hiatal hernia. Electronically Signed   By: Rockey Kilts M.D.   On: 04/18/2024 13:29   CT Head Wo Contrast Result Date: 04/17/2024 CLINICAL DATA:  Altered mental status. EXAM: CT HEAD WITHOUT CONTRAST TECHNIQUE: Contiguous axial images were obtained from the base of the skull through the vertex without intravenous contrast. RADIATION DOSE REDUCTION: This exam was performed according to the departmental dose-optimization program which includes automated exposure control, adjustment of the mA and/or kV according to patient size and/or use of iterative reconstruction technique. COMPARISON:  January 13, 2020 FINDINGS: Brain: There is generalized cerebral atrophy with widening of the extra-axial spaces and ventricular dilatation. There are areas of decreased attenuation  within the white matter tracts of the supratentorial brain, consistent with microvascular disease changes. A chronic anterolateral right temporal lobe infarct is suspected. Vascular: Moderate to marked severity bilateral cavernous carotid artery calcification is noted. Skull: Normal. Negative for fracture or focal lesion. Sinuses/Orbits: No acute finding. Other: None. IMPRESSION: 1. Generalized cerebral atrophy with chronic white matter small vessel ischemic changes. 2. Chronic anterolateral right temporal lobe infarct. Nonemergent MRI correlation is recommended. 3. No acute intracranial abnormality. Electronically Signed   By: Suzen Dials M.D.   On: 04/17/2024 14:43   DG Chest Portable 1 View Result Date: 04/17/2024 CLINICAL DATA:  syncope,  ams EXAM: PORTABLE CHEST - 1 VIEW COMPARISON:  April 13, 2024 FINDINGS: Low lung volumes with bronchovascular crowding. No focal  airspace consolidation, pleural effusion, or pneumothorax. No cardiomegaly. Aortic atherosclerosis. No acute fracture or destructive lesions. Multilevel thoracic osteophytosis. Bilateral AC joint osteoarthritis. IMPRESSION: Low lung volumes.  Otherwise, no acute cardiopulmonary abnormality. Electronically Signed   By: Rogelia Myers M.D.   On: 04/17/2024 13:42   CT ABDOMEN PELVIS WO CONTRAST Result Date: 04/13/2024 CLINICAL DATA:  General abdominal pain EXAM: CT ABDOMEN AND PELVIS WITHOUT CONTRAST TECHNIQUE: Multidetector CT imaging of the abdomen and pelvis was performed following the standard protocol without IV contrast. RADIATION DOSE REDUCTION: This exam was performed according to the departmental dose-optimization program which includes automated exposure control, adjustment of the mA and/or kV according to patient size and/or use of iterative reconstruction technique. COMPARISON:  None available FINDINGS: Lower chest: No acute abnormality Hepatobiliary: No focal hepatic abnormality. Gallbladder unremarkable. Pancreas: No focal  abnormality or ductal dilatation. Spleen: No focal abnormality.  Normal size. Adrenals/Urinary Tract: No adrenal abnormality. No focal renal abnormality. No stones or hydronephrosis. Foley catheter in the bladder with bladder decompressed. Stomach/Bowel: Left colonic diverticulosis. Slight haziness noted around the mid descending colon as well as the mid sigmoid colon concerning for early diverticulitis. Stomach and small bowel decompressed. Vascular/Lymphatic: Aortic atherosclerosis. No evidence of aneurysm or adenopathy. Reproductive: Prior hysterectomy.  No adnexal masses. Other: No free fluid or free air. Musculoskeletal: No acute bony abnormality IMPRESSION: Diffuse left colonic diverticulosis. Slight haziness noted adjacent to the distal descending colon and mid sigmoid colon concerning for early active diverticulitis. No complicating feature. Aortic atherosclerosis. Electronically Signed   By: Franky Crease M.D.   On: 04/13/2024 16:35   DG Chest Port 1 View Result Date: 04/13/2024 CLINICAL DATA:  Questionable sepsis EXAM: PORTABLE CHEST 1 VIEW COMPARISON:  05/22/2011 FINDINGS: Normal cardiac silhouette. Ectatic aorta. Low lung volumes. Chronic bronchitic markings. No focal consolidation. No pneumothorax No acute osseous abnormality. IMPRESSION: Low lung volumes and chronic bronchitic markings. No acute findings. Electronically Signed   By: Jackquline Boxer M.D.   On: 04/13/2024 14:36    Microbiology: Results for orders placed or performed during the hospital encounter of 04/13/24  Blood Culture (routine x 2)     Status: None   Collection Time: 04/13/24  2:47 PM   Specimen: BLOOD  Result Value Ref Range Status   Specimen Description BLOOD BLOOD LEFT HAND  Final   Special Requests   Final    BOTTLES DRAWN AEROBIC AND ANAEROBIC Blood Culture results may not be optimal due to an inadequate volume of blood received in culture bottles   Culture   Final    NO GROWTH 5 DAYS Performed at [facility], [address], [place], [place] [address]    Report Status 04/18/2024 FINAL  Final  Blood Culture (routine x 2)     Status: None   Collection Time: 04/13/24  2:47 PM   Specimen: BLOOD  Result Value Ref Range Status   Specimen Description BLOOD BLOOD RIGHT ARM  Final   Special Requests   Final    BOTTLES DRAWN AEROBIC AND ANAEROBIC Blood Culture results may not be optimal due to an inadequate volume of blood received in culture bottles   Culture   Final    NO GROWTH 5 DAYS Performed at [facility], [address] [place], [place] [address]    Report Status 04/18/2024 FINAL  Final  MRSA Next Gen by PCR, Nasal     Status: None   Collection Time: 04/13/24  5:40 PM   Specimen: Nasal Mucosa; Nasal Swab  Result Value Ref Range Status   MRSA by PCR Next Gen NOT DETECTED NOT DETECTED Final    Comment: (NOTE) The GeneXpert MRSA Assay (FDA approved for NASAL specimens only), is one component of a comprehensive MRSA colonization surveillance program. It is not intended to diagnose MRSA infection nor to guide or monitor treatment for MRSA infections. Test performance is not FDA approved in patients less than 49 years old. Performed at [facility], [address], [place], [place] [address]     Labs: CBC: Recent Labs  Lab 04/13/24 1447 04/14/24 0304 04/15/24 0456 04/17/24 1318  WBC 5.2 4.6 5.5 10.5  NEUTROABS 3.8  --   --   --   HGB 11.4* 10.5* 10.8* 10.3*  HCT 35.3* 33.1* 35.8* 32.8*  MCV 86.9 88.3 88.8 86.3  PLT 230 218 217 195   Basic Metabolic Panel: Recent Labs  Lab 04/15/24 0456 04/16/24 0429 04/17/24 1318 04/18/24 0805 04/19/24 0456  NA 137 138 132* 136 136  K 4.6 3.6 4.3 3.9 4.0  CL 106 103 102 103 103  CO2 23 26 21* 25 24  GLUCOSE 97 103* 101* 107* 96  BUN 24* 24* 23 24* 21  CREATININE 1.58* 1.40* 1.46* 1.20* 1.10*  CALCIUM  9.5 9.6 7.8* 8.9 9.0  MG  --   --   --  1.9  --    Liver Function Tests: Recent Labs  Lab 04/13/24 1447 04/17/24 1318  AST 52*  29  ALT 82* 30  ALKPHOS 190* 138*  BILITOT 0.5 0.9  PROT 7.4 6.4*  ALBUMIN 3.6 2.7*   CBG: Recent Labs  Lab 04/13/24 1450 04/14/24 1120 04/14/24 2144 04/17/24 1300  GLUCAP 90 84 140* 117*    Discharge time spent: greater than 30 minutes.  Signed: Alm Schneider, MD [facility] 04/19/2024

## 2024-04-19 NOTE — Procedures (Signed)
 Patient Name: Doris Riley  MRN: [id number]  Epilepsy Attending: Arlin MALVA Krebs  Referring Physician/Provider: Evonnie Lenis, MD  Date: 04/18/2024 Duration: 22.57 mins  Patient history: 83yo M with ams. EEG to evaluate for seizure  Level of alertness: Awake  AEDs during EEG study: None  Technical aspects: This EEG study was done with scalp electrodes positioned according to the 10-20 International system of electrode placement. Electrical activity was reviewed with band pass filter of 1-70Hz , sensitivity of 7 uV/mm, display speed of 68mm/sec with a 60Hz  notched filter applied as appropriate. EEG data were recorded continuously and digitally stored.  Video monitoring was available and reviewed as appropriate.  Description: EEG showed continuous generalized 3 to 5 Hz theta-delta slowing. Hyperventilation and photic stimulation were not performed.     ABNORMALITY - Continuous slow, generalized  IMPRESSION: This study is suggestive of moderate diffuse encephalopathy. No seizures or epileptiform discharges were seen throughout the recording.  Doris Riley

## 2024-04-19 NOTE — Plan of Care (Signed)

## 2024-04-19 NOTE — Progress Notes (Signed)
   04/19/24 0142  Vitals  BP (!) 136/44  MAP (mmHg) 72  BP Method Automatic  Pulse Rate 63  MEWS COLOR  MEWS Score Color Green  MEWS Score  MEWS Temp 0  MEWS Systolic 0  MEWS Pulse 0  MEWS RR 0  MEWS LOC 0  MEWS Score 0   Dr.Adefeso made aware of patients BP, no new ordered obtained

## 2024-04-19 NOTE — TOC Transition Note (Signed)
 Transition of Care [facility]) - Discharge Note   Patient Details  Name: Doris Riley MRN: [id number] Date of Birth: 04-02-1941  [facility] CM/SW Contact:  Nena LITTIE Coffee, RN Phone Number: 04/19/2024, 2:07 PM   Clinical Narrative:    Pt to return to [facility] this afternoon.   Final next level of care: Long Term Nursing Home Barriers to Discharge: Barriers Resolved  Patient Goals and CMS Choice Patient states their goals for this hospitalization and ongoing recovery are:: return [facility].gov Compare Post Acute Care list provided to:: Patient Represenative (must comment) Choice offered to / list presented to :  [place])    Discharge Placement   Discharge Plan and Services Additional resources added to the After Visit Summary for       Post Acute Care Choice: Skilled Nursing Facility, Durable Medical Equipment            Social Drivers of Health (SDOH) Interventions SDOH Screenings   Food Insecurity: No Food Insecurity (04/17/2024)  Housing: Low Risk  (04/17/2024)  Transportation Needs: No Transportation Needs (04/17/2024)  Utilities: Not At Risk (04/17/2024)  Recent Concern: Utilities - At Risk (04/13/2024)  Depression (PHQ2-9): Medium Risk (12/09/2019)  Social Connections: Patient Unable To Answer (04/17/2024)  Tobacco Use: Low Risk  (04/17/2024)   Readmission Risk Interventions     No data to display

## 2024-04-20 DIAGNOSIS — N1832 Chronic kidney disease, stage 3b: Secondary | ICD-10-CM | POA: Diagnosis not present

## 2024-04-20 DIAGNOSIS — J690 Pneumonitis due to inhalation of food and vomit: Secondary | ICD-10-CM | POA: Diagnosis not present

## 2024-04-20 DIAGNOSIS — R55 Syncope and collapse: Secondary | ICD-10-CM | POA: Diagnosis not present

## 2024-04-20 DIAGNOSIS — G9340 Encephalopathy, unspecified: Secondary | ICD-10-CM | POA: Diagnosis not present

## 2024-04-20 DIAGNOSIS — E871 Hypo-osmolality and hyponatremia: Secondary | ICD-10-CM | POA: Diagnosis not present

## 2024-04-20 NOTE — Plan of Care (Signed)
  Problem: Health Behavior/Discharge Planning: Goal: Ability to manage health-related needs will improve Outcome: Progressing   Problem: Clinical Measurements: Goal: Ability to maintain clinical measurements within normal limits will improve Outcome: Progressing Goal: Will remain free from infection Outcome: Progressing Goal: Diagnostic test results will improve Outcome: Progressing   Problem: Activity: Goal: Risk for activity intolerance will decrease Outcome: Progressing   Problem: Nutrition: Goal: Adequate nutrition will be maintained Outcome: Progressing   Problem: Coping: Goal: Level of anxiety will decrease Outcome: Progressing   Problem: Safety: Goal: Ability to remain free from injury will improve Outcome: Progressing

## 2024-04-20 NOTE — Discharge Summary (Signed)
 Physician Discharge Summary   Patient: Doris Riley MRN: [id number] DOB: [birth year]  Admit date:     04/17/2024  Discharge date: 04/20/24  Discharge Physician: Alm Mazzie Brodrick   PCP: System, Provider Not In   Recommendations at discharge:   Please follow up with primary care provider within 1-2 weeks  Please repeat BMP and CBC in one week     Hospital Course: 83 year old female with a history of dementia, hypertension, depression presenting with altered mental status.  The patient was recently hospitalized 04/13/2024 to 04/16/2024 the patient presented with hypothermia and lethargy.  A.m. cortisol was 14.0.  TSH was 6.4 with normal T3 and T4.  UA was negative for pyuria.  CT AP on 04/13/2024 showed diffuse left colonic diverticulosis with slight haziness adjacent to the distal descending colon and in the sigmoid colon concerning for early acute diverticulitis.  The patient was treated with IV antibiotics and transition to oral Augmentin  at time of discharge.  At the time of discharge, her Lotrel, Lexapro , discontinued. She was brought back to the emergency department because the patient had an episode of emesis while eating breakfast and subsequently became unresponsive.  Per EMS patient responsive to pain.  On arrival to the ED, patient was lethargic and quickly falling asleep.  Unfortunately, the patient was unable to provide us  in the history secondary to her dementia.  In the ED, the temperature 99.4 Fahrenheit.  She was hemodynamically stable with oxygen saturation 99% on room air.  WBC 10.5, hemoglobin 10.3, platelets 195.  LFTs unremarkable.  Sodium 132, potassium 4.3, 21, serum creatinine 1.46.  EKG shows sinus rhythm left bundle branch block.  Troponin 12>> 12.  Lactic acid 1.9.  UA was negative for pyuria.  CT brain was negative for acute findings. Work up revealed low B12 and folic acid  which were repleted.  Her mental status improved and returned to baseline.  She was started on IV abx for  aspiration pneumonitis.  She will d/c with 6 more days oral abx.  MR brain was negative for acute findings.  CTA chest was negative for large/central PE.   Return to SNF was delayed due to logistics involving transport.  Patient remained clinically stable for d/c 04/20/24.  Assessment and Plan: Acute Metabolic Encephalopathy/Transient Loss of Consciousness -at baseline pt is conversant, pleasantly confused -presented with lethargy only responsive to pain -UA no pyuria -B12--201>>replete--given IM x 1 and start po -folate--5.2>>replete -TSH--2.654 -MR brain--no acute abnormality -EEG--neg seizure -suspect volume depletion/hypotension/vagal phenomena -check orthostatics--pt is uncooperative -Echo--EF 55-60, no WMA, limited due to poor cooperation -6/28--mentation back to baseline--pt is pleasantly confused, A&O x 1   Elevated D-dimer -only minimally elevated for age, but pt complains of ?dyspnea -CTA chest--no lobar or central PE,  Peribronchovascular right upper lobe ground-glass, favoring mild Infection   Aspiration pneumonitis -continue amox/clav x 6 more days -add doxycycline x 6 more days   Acute Diverticulitis -04/13/24 CT AP as discussed above -no abd pain on exam 6/27 -continue amox/clav   Major Neurocognitive Disorder -pt has hx of sundowning   CKD 3b -baseline creatinine 1.2-1.5   Essential Hypertension -holding amlodipine  due to soft BPs -BPs remained controllled -will not restart amlodipine    Hyponatremia -due to volume depletion -continue IVF>>improved             Consultants: none Procedures performed: none  Disposition: Skilled nursing facility Diet recommendation:  Regular diet DISCHARGE MEDICATION: Allergies as of 04/20/2024       Reactions   Tape Other (  See Comments)   Skin sensitivity        Medication List     STOP taking these medications    amLODipine  10 MG tablet Commonly known as: NORVASC        TAKE these medications     acetaminophen  650 MG CR tablet Commonly known as: TYLENOL  Take 650 mg by mouth 3 (three) times daily.   amoxicillin -clavulanate 875-125 MG tablet Commonly known as: AUGMENTIN  Take 1 tablet by mouth every 12 (twelve) hours for 8 days. X 6 days What changed: additional instructions   atorvastatin  10 MG tablet Commonly known as: LIPITOR Take 1 tablet (10 mg total) by mouth daily.   cyanocobalamin  500 MCG tablet Commonly known as: VITAMIN B12 Take 1 tablet (500 mcg total) by mouth daily.   donepezil  10 MG tablet Commonly known as: ARICEPT  Take 1 tablet (10 mg total) by mouth at bedtime. What changed: additional instructions   donepezil  5 MG tablet Commonly known as: ARICEPT  Take 5 mg by mouth at bedtime. Take with 10 mg for total of 15 mg What changed: Another medication with the same name was changed. Make sure you understand how and when to take each.   doxycycline 100 MG tablet Commonly known as: VIBRA-TABS Take 1 tablet (100 mg total) by mouth every 12 (twelve) hours. X 6 days   folic acid  1 MG tablet Commonly known as: FOLVITE  Take 1 tablet (1 mg total) by mouth daily.   furosemide  20 MG tablet Commonly known as: LASIX  Take 10 mg by mouth daily.   memantine  10 MG tablet Commonly known as: NAMENDA  Take 1 tablet (10 mg total) by mouth 2 (two) times daily.   ondansetron  4 MG disintegrating tablet Commonly known as: ZOFRAN -ODT Take 4 mg by mouth every 8 (eight) hours as needed for nausea or vomiting.   senna-docusate 8.6-50 MG tablet Commonly known as: Senokot-S Take 1 tablet by mouth 2 (two) times daily.        Discharge Exam: Filed Weights   04/17/24 1253 04/17/24 1710  Weight: 105 kg 104.6 kg   HEENT:  [place]/AT, No thrush, no icterus CV:  RRR, no rub, no S3, no S4 Lung: bibasilar rales.  No wheeze Abd:  soft/+BS, NT Ext:  No edema, no lymphangitis, no synovitis, no rash   Condition at discharge: stable  The results of significant diagnostics from  this hospitalization (including imaging, microbiology, ancillary and laboratory) are listed below for reference.   Imaging Studies: EEG adult Result Date: 04/19/2024 Shelton Arlin KIDD, MD     04/19/2024  5:21 AM Patient Name: YAMARI VENTOLA MRN: [id number] Epilepsy Attending: Arlin KIDD Shelton Referring Physician/Provider: Evonnie Lenis, MD Date: 04/18/2024 Duration: 22.57 mins Patient history: 83yo M with ams. EEG to evaluate for seizure Level of alertness: Awake AEDs during EEG study: None Technical aspects: This EEG study was done with scalp electrodes positioned according to the 10-20 International system of electrode placement. Electrical activity was reviewed with band pass filter of 1-70Hz , sensitivity of 7 uV/mm, display speed of 54mm/sec with a 60Hz  notched filter applied as appropriate. EEG data were recorded continuously and digitally stored.  Video monitoring was available and reviewed as appropriate. Description: EEG showed continuous generalized 3 to 5 Hz theta-delta slowing. Hyperventilation and photic stimulation were not performed.   ABNORMALITY - Continuous slow, generalized IMPRESSION: This study is suggestive of moderate diffuse encephalopathy. No seizures or epileptiform discharges were seen throughout the recording. Arlin KIDD Shelton   ECHOCARDIOGRAM COMPLETE Result Date: 04/18/2024  ECHOCARDIOGRAM REPORT   Patient Name:   URIEL HORKEY Date of Exam: 04/18/2024 Medical Rec #:  [id number]       Height:       66.0 in Accession #:    [id number]      Weight:       230.6 lb Date of Birth:  [birth year]       BSA:          2.125 m Patient Age:    82 years        BP:           120/41 mmHg Patient Gender: F               HR:           75 bpm. Exam Location:  [facility] Procedure: 2D Echo (Both Spectral and Color Flow Doppler were utilized during            procedure). Indications:    Syncope  History:        Patient has no prior history of Echocardiogram examinations.                 Risk  Factors:Hypertension.  Sonographer:    Jayson Gaskins Referring Phys: [id number] EJIROGHENE E [facility]  Sonographer Comments: Image acquisition challenging due to uncooperative patient. IMPRESSIONS  1. Left ventricular ejection fraction, by estimation, is 55 to 60%. The left ventricle has normal function. The left ventricle has no regional wall motion abnormalities. There is mild left ventricular hypertrophy.  2. Right ventricular systolic function was not well visualized. The right ventricular size is not well visualized.  3. The mitral valve was not well visualized.  4. The aortic valve was not well visualized.  5. Limited study, patient refused to complete study. FINDINGS  Left Ventricle: Left ventricular ejection fraction, by estimation, is 55 to 60%. The left ventricle has normal function. The left ventricle has no regional wall motion abnormalities. The left ventricular internal cavity size was normal in size. There is  mild left ventricular hypertrophy. Right Ventricle: The right ventricular size is not well visualized. Right vetricular wall thickness was not well visualized. Right ventricular systolic function was not well visualized. Left Atrium: Left atrial size was not well visualized. Right Atrium: Right atrial size was not well visualized. Pericardium: The pericardium was not well visualized. Mitral Valve: The mitral valve was not well visualized. Tricuspid Valve: The tricuspid valve is not well visualized. Aortic Valve: The aortic valve was not well visualized. Pulmonic Valve: The pulmonic valve was not well visualized. Pulmonic valve regurgitation is not visualized. No evidence of pulmonic stenosis. Aorta: The aortic root is normal in size and structure. IAS/Shunts: The interatrial septum was not well visualized.  LEFT VENTRICLE PLAX 2D LVIDd:         4.50 cm LVIDs:         3.10 cm LV PW:         1.10 cm LV IVS:        1.10 cm LVOT diam:     1.80 cm LVOT Area:     2.54 cm   AORTA Ao Root diam: 2.30 cm  SHUNTS  Systemic Diam: 1.80 cm Dorn Ross MD Electronically signed by Dorn Ross MD Signature Date/Time: 04/18/2024/3:10:21 PM    Final    MR BRAIN WO CONTRAST Result Date: 04/18/2024 CLINICAL DATA:  Mental status change, history of dementia. EXAM: MRI HEAD WITHOUT CONTRAST TECHNIQUE: Multiplanar, multiecho pulse sequences of the brain and  surrounding structures were obtained without intravenous contrast. COMPARISON:  CT head 04/17/2024. FINDINGS: Brain: No acute infarct. No evidence of intracranial hemorrhage. Scattered T2/FLAIR hyperintensity in the periventricular and subcortical white matter. Mild-to-moderate generalized parenchymal volume loss without lobar specific atrophy. Remote infarct in the anterolateral right temporal lobe. No edema, mass effect, or midline shift. Posterior fossa is unremarkable. Normal appearance of midline structures. The basilar cisterns are patent. No extra-axial fluid collections. Ventricles: Prominence of the lateral ventricles suggestive of underlying parenchymal volume loss. Vascular: Skull base flow voids are visualized. Skull and upper cervical spine: No focal abnormality. Sinuses/Orbits: Orbits are symmetric. Paranasal sinuses are clear. Other: Mastoid air cells are clear. IMPRESSION: No acute intracranial abnormality. Mild chronic microvascular ischemic changes. Mild to moderate parenchymal volume loss without lobar specific atrophy. Remote infarct in the anterolateral right temporal lobe. Electronically Signed   By: Donnice Mania M.D.   On: 04/18/2024 14:59   CT Angio Chest Pulmonary Embolism (PE) W or WO Contrast Result Date: 04/18/2024 CLINICAL DATA:  Dyspnea.  Elevated D-dimer. EXAM: CT ANGIOGRAPHY CHEST WITH CONTRAST TECHNIQUE: Multidetector CT imaging of the chest was performed using the standard protocol during bolus administration of intravenous contrast. Multiplanar CT image reconstructions and MIPs were obtained to evaluate the vascular anatomy. RADIATION  DOSE REDUCTION: This exam was performed according to the departmental dose-optimization program which includes automated exposure control, adjustment of the mA and/or kV according to patient size and/or use of iterative reconstruction technique. CONTRAST:  60mL OMNIPAQUE IOHEXOL 350 MG/ML SOLN COMPARISON:  Plain film yesterday.  No prior CT. FINDINGS: Cardiovascular: The quality of this exam for evaluation of pulmonary embolism is poor. The bolus is poorly timed, centered in the aorta. In addition, there are limitations secondary to patient body habitus, mild motion, and arm position (not raised above the head). No central or large lobar pulmonary embolism. Aortic atherosclerosis. Mild cardiomegaly, without pericardial effusion. Lad and right coronary artery calcification. Mediastinum/Nodes: No mediastinal or hilar adenopathy. Tiny hiatal hernia. Lungs/Pleura: No pleural fluid. Heterogeneous right upper lobe peribronchovascular ground-glass. Upper Abdomen: Normal imaged portions of the liver, spleen, pancreas, left adrenal gland, left kidney. Scattered colonic diverticula. Musculoskeletal: No acute osseous abnormality. Review of the MIP images confirms the above findings. IMPRESSION: 1. Poor quality evaluation for pulmonary embolism as detailed above. No central or large lobar embolism identified. 2. Peribronchovascular right upper lobe ground-glass, favoring mild infection. 3. Incidental findings, including: Coronary artery atherosclerosis. Aortic Atherosclerosis (ICD10-I70.0). Tiny hiatal hernia. Electronically Signed   By: Rockey Kilts M.D.   On: 04/18/2024 13:29   CT Head Wo Contrast Result Date: 04/17/2024 CLINICAL DATA:  Altered mental status. EXAM: CT HEAD WITHOUT CONTRAST TECHNIQUE: Contiguous axial images were obtained from the base of the skull through the vertex without intravenous contrast. RADIATION DOSE REDUCTION: This exam was performed according to the departmental dose-optimization program which  includes automated exposure control, adjustment of the mA and/or kV according to patient size and/or use of iterative reconstruction technique. COMPARISON:  January 13, 2020 FINDINGS: Brain: There is generalized cerebral atrophy with widening of the extra-axial spaces and ventricular dilatation. There are areas of decreased attenuation within the white matter tracts of the supratentorial brain, consistent with microvascular disease changes. A chronic anterolateral right temporal lobe infarct is suspected. Vascular: Moderate to marked severity bilateral cavernous carotid artery calcification is noted. Skull: Normal. Negative for fracture or focal lesion. Sinuses/Orbits: No acute finding. Other: None. IMPRESSION: 1. Generalized cerebral atrophy with chronic white matter small vessel ischemic changes. 2.  Chronic anterolateral right temporal lobe infarct. Nonemergent MRI correlation is recommended. 3. No acute intracranial abnormality. Electronically Signed   By: Suzen Dials M.D.   On: 04/17/2024 14:43   DG Chest Portable 1 View Result Date: 04/17/2024 CLINICAL DATA:  syncope,  ams EXAM: PORTABLE CHEST - 1 VIEW COMPARISON:  April 13, 2024 FINDINGS: Low lung volumes with bronchovascular crowding. No focal airspace consolidation, pleural effusion, or pneumothorax. No cardiomegaly. Aortic atherosclerosis. No acute fracture or destructive lesions. Multilevel thoracic osteophytosis. Bilateral AC joint osteoarthritis. IMPRESSION: Low lung volumes.  Otherwise, no acute cardiopulmonary abnormality. Electronically Signed   By: Rogelia Myers M.D.   On: 04/17/2024 13:42   CT ABDOMEN PELVIS WO CONTRAST Result Date: 04/13/2024 CLINICAL DATA:  General abdominal pain EXAM: CT ABDOMEN AND PELVIS WITHOUT CONTRAST TECHNIQUE: Multidetector CT imaging of the abdomen and pelvis was performed following the standard protocol without IV contrast. RADIATION DOSE REDUCTION: This exam was performed according to the departmental  dose-optimization program which includes automated exposure control, adjustment of the mA and/or kV according to patient size and/or use of iterative reconstruction technique. COMPARISON:  None available FINDINGS: Lower chest: No acute abnormality Hepatobiliary: No focal hepatic abnormality. Gallbladder unremarkable. Pancreas: No focal abnormality or ductal dilatation. Spleen: No focal abnormality.  Normal size. Adrenals/Urinary Tract: No adrenal abnormality. No focal renal abnormality. No stones or hydronephrosis. Foley catheter in the bladder with bladder decompressed. Stomach/Bowel: Left colonic diverticulosis. Slight haziness noted around the mid descending colon as well as the mid sigmoid colon concerning for early diverticulitis. Stomach and small bowel decompressed. Vascular/Lymphatic: Aortic atherosclerosis. No evidence of aneurysm or adenopathy. Reproductive: Prior hysterectomy.  No adnexal masses. Other: No free fluid or free air. Musculoskeletal: No acute bony abnormality IMPRESSION: Diffuse left colonic diverticulosis. Slight haziness noted adjacent to the distal descending colon and mid sigmoid colon concerning for early active diverticulitis. No complicating feature. Aortic atherosclerosis. Electronically Signed   By: Franky Crease M.D.   On: 04/13/2024 16:35   DG Chest Port 1 View Result Date: 04/13/2024 CLINICAL DATA:  Questionable sepsis EXAM: PORTABLE CHEST 1 VIEW COMPARISON:  05/22/2011 FINDINGS: Normal cardiac silhouette. Ectatic aorta. Low lung volumes. Chronic bronchitic markings. No focal consolidation. No pneumothorax No acute osseous abnormality. IMPRESSION: Low lung volumes and chronic bronchitic markings. No acute findings. Electronically Signed   By: Jackquline Boxer M.D.   On: 04/13/2024 14:36    Microbiology: Results for orders placed or performed during the hospital encounter of 04/13/24  Blood Culture (routine x 2)     Status: None   Collection Time: 04/13/24  2:47 PM    Specimen: BLOOD  Result Value Ref Range Status   Specimen Description BLOOD BLOOD LEFT HAND  Final   Special Requests   Final    BOTTLES DRAWN AEROBIC AND ANAEROBIC Blood Culture results may not be optimal due to an inadequate volume of blood received in culture bottles   Culture   Final    NO GROWTH 5 DAYS Performed at [facility], [address] [place], [place] [address]    Report Status 04/18/2024 FINAL  Final  Blood Culture (routine x 2)     Status: None   Collection Time: 04/13/24  2:47 PM   Specimen: BLOOD  Result Value Ref Range Status   Specimen Description BLOOD BLOOD RIGHT ARM  Final   Special Requests   Final    BOTTLES DRAWN AEROBIC AND ANAEROBIC Blood Culture results may not be optimal due to an inadequate volume of blood  received in culture bottles   Culture   Final    NO GROWTH 5 DAYS Performed at [facility], [address], [place], [place] [address]    Report Status 04/18/2024 FINAL  Final  MRSA Next Gen by PCR, Nasal     Status: None   Collection Time: 04/13/24  5:40 PM   Specimen: Nasal Mucosa; Nasal Swab  Result Value Ref Range Status   MRSA by PCR Next Gen NOT DETECTED NOT DETECTED Final    Comment: (NOTE) The GeneXpert MRSA Assay (FDA approved for NASAL specimens only), is one component of a comprehensive MRSA colonization surveillance program. It is not intended to diagnose MRSA infection nor to guide or monitor treatment for MRSA infections. Test performance is not FDA approved in patients less than 42 years old. Performed at [facility], [address] [place], [place] [address]     Labs: CBC: Recent Labs  Lab 04/13/24 1447 04/14/24 0304 04/15/24 0456 04/17/24 1318  WBC 5.2 4.6 5.5 10.5  NEUTROABS 3.8  --   --   --   HGB 11.4* 10.5* 10.8* 10.3*  HCT 35.3* 33.1* 35.8* 32.8*  MCV 86.9 88.3 88.8 86.3  PLT 230 218 217 195   Basic Metabolic Panel: Recent Labs  Lab 04/15/24 0456 04/16/24 0429 04/17/24 1318 04/18/24 0805 04/19/24 0456   NA 137 138 132* 136 136  K 4.6 3.6 4.3 3.9 4.0  CL 106 103 102 103 103  CO2 23 26 21* 25 24  GLUCOSE 97 103* 101* 107* 96  BUN 24* 24* 23 24* 21  CREATININE 1.58* 1.40* 1.46* 1.20* 1.10*  CALCIUM  9.5 9.6 7.8* 8.9 9.0  MG  --   --   --  1.9  --    Liver Function Tests: Recent Labs  Lab 04/13/24 1447 04/17/24 1318  AST 52* 29  ALT 82* 30  ALKPHOS 190* 138*  BILITOT 0.5 0.9  PROT 7.4 6.4*  ALBUMIN 3.6 2.7*   CBG: Recent Labs  Lab 04/13/24 1450 04/14/24 1120 04/14/24 2144 04/17/24 1300  GLUCAP 90 84 140* 117*    Discharge time spent: greater than 30 minutes.  Signed: Alm Schneider, MD [facility] 04/20/2024

## 2024-04-20 NOTE — Progress Notes (Signed)
   04/20/24 0850  TOC Barriers to Discharge  Barriers to Discharge Transportation ([facility] is not running a convalescent truck this weekend. They are transporting pts in between EMS calls. Spoke c/dispatcher this morning to verify pt is still on list from yesterday.)
